# Patient Record
Sex: Male | Born: 1945 | Race: Black or African American | Hispanic: No | Marital: Married | Smoking: Never smoker
Health system: Southern US, Community
[De-identification: ages and names within clinical notes are randomized; demographics above are authoritative.]

## PROBLEM LIST (undated history)

## (undated) DIAGNOSIS — I4891 Unspecified atrial fibrillation: Secondary | ICD-10-CM

## (undated) DIAGNOSIS — R9431 Abnormal electrocardiogram [ECG] [EKG]: Secondary | ICD-10-CM

## (undated) DIAGNOSIS — B351 Tinea unguium: Secondary | ICD-10-CM

## (undated) DIAGNOSIS — I10 Essential (primary) hypertension: Secondary | ICD-10-CM

## (undated) DIAGNOSIS — I493 Ventricular premature depolarization: Secondary | ICD-10-CM

## (undated) DIAGNOSIS — J3089 Other allergic rhinitis: Secondary | ICD-10-CM

## (undated) DIAGNOSIS — R002 Palpitations: Secondary | ICD-10-CM

## (undated) DIAGNOSIS — R5383 Other fatigue: Secondary | ICD-10-CM

## (undated) DIAGNOSIS — N183 Chronic kidney disease, stage 3 unspecified: Secondary | ICD-10-CM

## (undated) DIAGNOSIS — D649 Anemia, unspecified: Secondary | ICD-10-CM

## (undated) DIAGNOSIS — C649 Malignant neoplasm of unspecified kidney, except renal pelvis: Secondary | ICD-10-CM

## (undated) DIAGNOSIS — J339 Nasal polyp, unspecified: Secondary | ICD-10-CM

## (undated) DIAGNOSIS — J4540 Moderate persistent asthma, uncomplicated: Secondary | ICD-10-CM

## (undated) DIAGNOSIS — M79606 Pain in leg, unspecified: Secondary | ICD-10-CM

## (undated) DIAGNOSIS — J320 Chronic maxillary sinusitis: Secondary | ICD-10-CM

## (undated) HISTORY — PX: SINOSCOPY: SHX187

## (undated) HISTORY — DX: Chronic maxillary sinusitis: J32.0

## (undated) HISTORY — DX: Other allergic rhinitis: J30.89

## (undated) HISTORY — DX: Moderate persistent asthma, uncomplicated: J45.40

## (undated) HISTORY — DX: Tinea unguium: B35.1

## (undated) HISTORY — DX: Nasal polyp, unspecified: J33.9

## (undated) HISTORY — DX: Palpitations: R00.2

## (undated) HISTORY — DX: Malignant neoplasm of unspecified kidney, except renal pelvis: C64.9

## (undated) HISTORY — DX: Essential (primary) hypertension: I10

## (undated) HISTORY — DX: Other fatigue: R53.83

## (undated) HISTORY — PX: HEMORRHOID SURGERY: SHX153

## (undated) HISTORY — DX: Pain in leg, unspecified: M79.606

## (undated) HISTORY — PX: OTHER SURGICAL HISTORY: SHX169

---

## 1998-10-05 HISTORY — PX: OTHER SURGICAL HISTORY: SHX169

## 2001-01-17 ENCOUNTER — Encounter: Admission: RE | Admit: 2001-01-17 | Discharge: 2001-01-17 | Payer: Self-pay | Admitting: *Deleted

## 2001-03-21 ENCOUNTER — Ambulatory Visit (HOSPITAL_COMMUNITY): Admission: RE | Admit: 2001-03-21 | Discharge: 2001-03-21 | Payer: Self-pay | Admitting: Gastroenterology

## 2001-03-23 ENCOUNTER — Emergency Department (HOSPITAL_COMMUNITY): Admission: EM | Admit: 2001-03-23 | Discharge: 2001-03-24 | Payer: Self-pay | Admitting: Emergency Medicine

## 2004-07-14 ENCOUNTER — Encounter: Admission: RE | Admit: 2004-07-14 | Discharge: 2004-07-14 | Payer: Self-pay | Admitting: Obstetrics and Gynecology

## 2006-08-12 ENCOUNTER — Ambulatory Visit (HOSPITAL_BASED_OUTPATIENT_CLINIC_OR_DEPARTMENT_OTHER): Admission: RE | Admit: 2006-08-12 | Discharge: 2006-08-12 | Payer: Self-pay | Admitting: Orthopedic Surgery

## 2009-07-18 ENCOUNTER — Ambulatory Visit: Payer: Self-pay | Admitting: Critical Care Medicine

## 2009-07-18 DIAGNOSIS — C649 Malignant neoplasm of unspecified kidney, except renal pelvis: Secondary | ICD-10-CM

## 2009-07-18 DIAGNOSIS — J4540 Moderate persistent asthma, uncomplicated: Secondary | ICD-10-CM

## 2009-07-18 DIAGNOSIS — I10 Essential (primary) hypertension: Secondary | ICD-10-CM

## 2009-07-18 HISTORY — DX: Moderate persistent asthma, uncomplicated: J45.40

## 2009-07-18 HISTORY — DX: Malignant neoplasm of unspecified kidney, except renal pelvis: C64.9

## 2009-07-18 HISTORY — DX: Essential (primary) hypertension: I10

## 2009-08-07 ENCOUNTER — Encounter: Payer: Self-pay | Admitting: Critical Care Medicine

## 2009-08-07 ENCOUNTER — Ambulatory Visit: Payer: Self-pay | Admitting: Critical Care Medicine

## 2009-08-22 ENCOUNTER — Ambulatory Visit: Payer: Self-pay | Admitting: Critical Care Medicine

## 2009-08-22 DIAGNOSIS — K219 Gastro-esophageal reflux disease without esophagitis: Secondary | ICD-10-CM | POA: Insufficient documentation

## 2009-11-07 ENCOUNTER — Ambulatory Visit: Payer: Self-pay | Admitting: Critical Care Medicine

## 2010-05-19 ENCOUNTER — Telehealth: Payer: Self-pay | Admitting: Critical Care Medicine

## 2010-09-26 ENCOUNTER — Encounter
Admission: RE | Admit: 2010-09-26 | Discharge: 2010-09-26 | Payer: Self-pay | Source: Home / Self Care | Attending: Gastroenterology | Admitting: Gastroenterology

## 2010-11-04 NOTE — Assessment & Plan Note (Signed)
Summary: Pulmonary OV   Copy to:  Dr. Harrington Challenger Primary Provider/Referring Provider:  Dr. Harrington Challenger  CC:  3 mo follow up.  states breathing is doing well overall.  No complaints. .  History of Present Illness: Pulmonary Consultation         This is a 65 year old male who presents with asthma.   Pt has been diagnosed with asthma for two months.  No real ppt factor.  Occ sneeze and runny eyes.  Rx IV and oral prednisone at ED and this helped.  Main issue is wheezing at night and dry cough.  Pt rx with ventolin as needed and this has not helped with wheeze.  Singulair has also been ineffective. No allergy testing was obtained.    Pt notes dry cough . Pt denies dyspnea, just wheeze and cough.  Never smoker. Pt denies any increase in rescue therapy over baseline, denies waking up needing it or having any early am or nocturnal exacerbations of coughing/wheezing/or dyspnea. Pt denies any significant sore throat, nasal congestion or excess secretions, fever, chills, sweats, unintended weight loss, pleurtic or exertional chest pain, orthopnea PND, or leg swelling   August 22, 2009 9:24 AM Cough is better,  wheeze is better. Pt denies any significant sore throat, nasal congestion or excess secretions, fever, chills, sweats, unintended weight loss, pleurtic or exertional chest pain, orthopnea PND, or leg swelling Pt denies any increase in rescue therapy over baseline, denies waking up needing it or having any early am or nocturnal exacerbations of coughing/wheezing/or dyspnea.  November 07, 2009 11:52 AM Pt doing well.  No recent issues with asthma. No cough or dyspnea.  No wheezing. Pt denies any significant sore throat, nasal congestion or excess secretions, fever, chills, sweats, unintended weight loss, pleurtic or exertional chest pain, orthopnea PND, or leg swelling Pt denies any increase in rescue therapy over baseline, denies waking up needing it or having any early am or nocturnal exacerbations of  coughing/wheezing/or dyspnea.    Asthma History    Asthma Control Assessment:    Age range: 12+ years    Symptoms: 0-2 days/week    Nighttime Awakenings: 0-2/month    Interferes w/ normal activity: no limitations    SABA use (not for EIB): 0-2 days/week    ATAQ questionnaire: 0    FEV1: 3.21 liters (today)    FEV1 Pred: 3.47 liters (today)    Exacerbations requiring oral systemic steroids: 0-1/year    Asthma Control Assessment: Well Controlled   Preventive Screening-Counseling & Management  Alcohol-Tobacco     Smoking Status: never  Current Medications (verified): 1)  Bystolic 10 Mg Tabs (Nebivolol Hcl) .... Once Daily 2)  Norvasc 5 Mg Tabs (Amlodipine Besylate) .... Once Daily 3)  Chlorthalidone 25 Mg Tabs (Chlorthalidone) .... Once Daily 4)  Omeprazole 20 Mg  Cpdr (Omeprazole) .... By Mouth Daily. Take One Half Hour Before Eating. 5)  Qvar 40 Mcg/act  Aers (Beclomethasone Dipropionate) .... Two Puffs Daily  Allergies (verified): No Known Drug Allergies  Past History:  Past medical, surgical, family and social histories (including risk factors) reviewed, and no changes noted (except as noted below).  Past Medical History: Reviewed history from 07/18/2009 and no changes required. KIDNEY CANCER (ICD-189.0) ASTHMA (ICD-493.90) HYPERTENSION (ICD-401.9)  Past Surgical History: Reviewed history from 07/18/2009 and no changes required. right hip replacement right ankle surgery right kidney removed    -2000 for renal CA Hemorrhoidectomy  Family History: Reviewed history from 07/18/2009 and no changes required. non contributory  Social History: Reviewed history from 07/18/2009 and no changes required. Patient never smoked.  alcohol 1-2 drinks/month widowed 3 children retired from [employer] attorney  Review of Systems  The patient denies shortness of breath with activity, shortness of breath at rest, productive cough, non-productive cough, coughing up blood,  chest pain, irregular heartbeats, acid heartburn, indigestion, loss of appetite, weight change, abdominal pain, difficulty swallowing, sore throat, tooth/dental problems, headaches, nasal congestion/difficulty breathing through nose, sneezing, itching, ear ache, anxiety, depression, hand/feet swelling, joint stiffness or pain, rash, change in color of mucus, and fever.    Vital Signs:  Patient profile:   65 year old male Height:      74 inches Weight:      238 pounds BMI:     30.67 O2 Sat:      100 % on Room air Temp:     98.0 degrees F oral Pulse rate:   60 / minute BP sitting:   122 / 84  (left arm) Cuff size:   regular  Vitals Entered By: Raymondo Band RN (November 07, 2009 11:48 AM)  O2 Flow:  Room air CC: 3 mo follow up.  states breathing is doing well overall.  No complaints.  Comments Medications reviewed with patient Daytime contact number verified with patient. Raymondo Band RN  November 07, 2009 11:49 AM    Physical Exam  Additional Exam:  Gen: Pleasant, well-nourished, in no distress,  normal affect ENT: No lesions,  mouth clear,  oropharynx clear, no postnasal drip Neck: No JVD, no TMG, no carotid bruits Lungs: No use of accessory muscles, no dullness to percussion, clear Cardiovascular: RRR, heart sounds normal, no murmur or gallops, no peripheral edema Abdomen: soft and NT, no HSM,  BS normal Musculoskeletal: No deformities, no cyanosis or clubbing Neuro: alert, non focal Skin: Warm, no lesions or rashes    Pre-Spirometry FEV1    Value: 3.21 L     Pred: 3.47 L     Impression & Recommendations:  Problem # 1:  ASTHMA (ICD-493.90) Assessment Improved  Prominent pseudowheeze and GERD symptoms consistent with upper airway instability  but also lower airway inflammation consistent wiht Dx of asthma>> all improved with rx of GERD and ICS   plan try to wean off qvar cont to follow reflux diet closely and possibly wean PPI off as well as needed SABA rov 75months    Complete Medication List: 1)  Bystolic 10 Mg Tabs (Nebivolol hcl) .... Once daily 2)  Norvasc 5 Mg Tabs (Amlodipine besylate) .... Once daily 3)  Chlorthalidone 25 Mg Tabs (Chlorthalidone) .... Once daily 4)  Omeprazole 20 Mg Cpdr (Omeprazole) .... By mouth daily. take one half hour before eating. 5)  Qvar 40 Mcg/act Aers (Beclomethasone dipropionate) .... Two puffs daily  Other Orders: Est. Patient Level III [id number]  Patient Instructions: 1)  You can wean off Qvar,  restart if you start wheezing or coughing again and call and let us know you restarted 2)  Follow reflux diet closely and you can then wean off omeprazole 3)  Return 4 months  Appended Document: Pulmonary OV fax Gus Height

## 2010-11-04 NOTE — Progress Notes (Signed)
Summary: rx  Phone Note Call from Patient Call back at [phone]   Caller: Patient Call For: wright Reason for Call: Talk to Nurse Summary of Call: pt on Qvar, cost is $100+.  Pt wants to know if there is something else you could prescribe thjat is cheaper? [facility] [address] Initial call taken by: Zigmund Gottron,  May 19, 2010 9:06 AM  Follow-up for Phone Call        advisd pt to contact his insurance company and ask for a drug formulary or ask them for covered medication in place of qvar. Advised that he is overdue for an appt, so when he gets this info to call and schedule appt to discuss with PW. Pt states he will do so. [place] Bing CMA  May 19, 2010 9:42 AM

## 2011-02-20 NOTE — Procedures (Signed)
[facility]  Patient:    Paul Roach, Paul Roach                        MRN: [id number] Adm. Date:  [id number] Attending:  Rafael Bihari CC:         Charles A. Harrington Challenger, M.D.   Procedure Report  PROCEDURE:  Colonoscopy.  ENDOSCOPIST:  Elyse Jarvis. Amedeo Plenty, M.D.  INDICATION FOR PROCEDURE:  Family history of colon cancer in two half siblings.  DESCRIPTION OF PROCEDURE:  The patient was placed in the left lateral decubitus position and placed on the pulse monitor with continuous low-flow oxygen delivered by nasal cannula.  He was sedated with Demerol 60 mg IV and Versed 6 mg IV.  The Olympus video colonoscope was inserted into the rectum and advanced to the cecum, confirmed by transillumination of McBurneys point and visualization of the ileocecal valve and appendiceal orifice.  The prep was good.  The cecum and ascending colon appeared normal with no masses, polyps, diverticula or other mucosal abnormalities.  Within the transverse colon, there were seen a typical-appearing lipoma which was not biopsied; this was approximately 1.5 cm in diameter.  The remainder of the transverse, descending, sigmoid and rectum appeared normal down to the anus, where there were some small non-thrombosed internal hemorrhoids seen.  The colonoscope was then withdrawn and the patient returned to the recovery room in stable condition.  He tolerated the procedure well and there were no immediate complications.  IMPRESSION:  Transverse colon lipoma, otherwise, normal colonoscopy with small internal hemorrhoids. DD:  03/21/01 TD:  03/21/01 Job: [id number]

## 2011-02-20 NOTE — Op Note (Signed)
NAME:  Paul Roach, Paul Roach NO.:  [id number]   MEDICAL RECORD NO.:  [id number]          PATIENT TYPE:  AMB   LOCATION:  [place]                          FACILITY:  [facility]   PHYSICIAN:  Ninetta Lights, M.D. DATE OF BIRTH:  1946-02-19   DATE OF PROCEDURE:  DATE OF DISCHARGE:                                 OPERATIVE REPORT   PREOPERATIVE DIAGNOSES:  1. Displaced lateral malleolus fracture, right ankle.  2. Mild deltoid ligament sprain.   POSTOPERATIVE DIAGNOSES:  1. Displaced lateral malleolus fracture, right ankle.  2. Mild deltoid ligament sprain.   OPERATIVE PROCEDURE:  Open reduction internal fixation of lateral malleolus  fracture with a 6-hole one-third tubular Synthes plate and screws.   SURGEON:  Ninetta Lights, M.D.   ASSISTANT:  Alyson Locket. Velora Heckler.   ANESTHESIA:  General.   BLOOD LOSS:  Minimal.   TOURNIQUET TIME:  40 minutes.   SPECIMENS:  None.   CULTURES:  None.   COMPLICATIONS:  None.   DRESSINGS:  Soft compressive with CAM walker.   DESCRIPTION OF THE PROCEDURE:  The patient was brought to the operating  room, and after adequate anesthesia had been obtained, tourniquet applied to  the upper aspect of the right leg.  Prepped and draped in the usual sterile  fashion.  Stress views were obtained with fluoroscopy.  With external  rotation, the lateral malleolus fracture was obviously displaced but the  syndesmosis does not open as the fracture begins below this and then exits  out superior and lateral.  Opens very slightly on the medial side showing a  deltoid ligament sprain, but this is not traumatic.  Exsanguinated with  elevation of Esmarch.  Tourniquet deflated to 350 mmHg.  A longitudinal  incision over the fibula.  Skin and subcutaneous divided.  Subperiosteal  exposure of the fracture.  The fibrous tissue from chronicity of injury  until the time of surgery was taken down, inspected, reapproximate the  fracture anatomically.  When  that was completed, I could reduce the fracture  anatomically.  A 6-hole one-third tubular plate was pre-bent to be placed on  the posterolateral aspect of the fibula.  Care was taken not to penetrate  the syndesmosis or the joint with any of the screws.  Confirmed good  reduction and fixation.  Held in place with a clamp.  Plate solidly fixed  with five 3.5 mm cortical screws and 1 distal 4.0 cancellous screw.  Excellent alignment, capture and fixation confirmed visually as well as  fluoroscopically.  Wound irrigated.  Closed with Vicryl and then staples.  A  sterile compressive dressing applied.  CAM walker applied.  Tourniquet  inflator removed.  Anesthesia reversed.  Brought to the recovery room.  Tolerated the surgery well.  No complications.      Ninetta Lights, M.D.  Electronically Signed     DFM/MEDQ  D:  08/12/2006  T:  08/13/2006  Job:  [id number]

## 2011-12-17 ENCOUNTER — Encounter: Payer: Self-pay | Admitting: *Deleted

## 2011-12-17 ENCOUNTER — Ambulatory Visit (INDEPENDENT_AMBULATORY_CARE_PROVIDER_SITE_OTHER): Payer: Medicare Other | Admitting: Critical Care Medicine

## 2011-12-17 ENCOUNTER — Ambulatory Visit (HOSPITAL_BASED_OUTPATIENT_CLINIC_OR_DEPARTMENT_OTHER)
Admission: RE | Admit: 2011-12-17 | Discharge: 2011-12-17 | Disposition: A | Discharge status: Home / Self Care | Payer: Medicare Other | Source: Ambulatory Visit | Attending: Critical Care Medicine | Admitting: Critical Care Medicine

## 2011-12-17 VITALS — BP 140/90 | HR 61 | Temp 98.0°F | Ht 74.0 in | Wt 246.0 lb

## 2011-12-17 DIAGNOSIS — K219 Gastro-esophageal reflux disease without esophagitis: Secondary | ICD-10-CM

## 2011-12-17 DIAGNOSIS — R059 Cough, unspecified: Secondary | ICD-10-CM | POA: Insufficient documentation

## 2011-12-17 DIAGNOSIS — J45909 Unspecified asthma, uncomplicated: Secondary | ICD-10-CM

## 2011-12-17 DIAGNOSIS — R05 Cough: Secondary | ICD-10-CM | POA: Insufficient documentation

## 2011-12-17 MED ORDER — PREDNISONE 10 MG PO TABS: ORAL_TABLET | ORAL | Status: DC

## 2011-12-17 MED ORDER — OMEPRAZOLE 20 MG PO CPDR: 20.0000 mg | DELAYED_RELEASE_CAPSULE | Freq: Every day | ORAL | Status: DC

## 2011-12-17 MED ORDER — BECLOMETHASONE DIPROPIONATE 40 MCG/ACT IN AERS: 2.0000 | INHALATION_SPRAY | Freq: Two times a day (BID) | RESPIRATORY_TRACT | Status: DC

## 2011-12-17 NOTE — Patient Instructions (Addendum)
Prednisone 10mg  Take 4 for two days three for two days two for two days one for two days Omeprazole 20mg  daily for 30days Reflux diet Chest Xray today Start Qvar two puff twice daily Return 6 weeks

## 2011-12-17 NOTE — Assessment & Plan Note (Addendum)
Or persistent asthma with acute flare Plan Prednisone 10mg  Take 4 for two days three for two days two for two days one for two days Omeprazole 20mg  daily for 30days Reflux diet Chest Xray today Start Qvar two puff twice daily Return 6 weeks

## 2011-12-17 NOTE — Progress Notes (Signed)
Subjective:    Patient ID: Paul Roach, male    DOB: December 21, 1945, 65 y.o.   MRN: [id number]  HPI This is a 66 year old male who presents with asthma.  Pt has been diagnosed with asthma for two months. No real ppt factor. Occ sneeze and runny eyes. Rx IV and oral prednisone at ED and this helped. Main issue is wheezing at night and dry cough. Pt rx with ventolin as needed and this has not helped with wheeze. Singulair has also been ineffective.  No allergy testing was obtained.  Pt notes dry cough . Pt denies dyspnea, just wheeze and cough. Never smoker.  Pt denies any increase in rescue therapy over baseline, denies waking up needing it or having any early am or nocturnal exacerbations of coughing/wheezing/or dyspnea.  Pt denies any significant sore throat, nasal congestion or excess secretions, fever, chills, sweats, unintended weight loss, pleurtic or exertional chest pain, orthopnea PND, or leg swelling  August 22, 2009 9:24 AM  Cough is better, wheeze is better.  Pt denies any significant sore throat, nasal congestion or excess secretions, fever, chills, sweats, unintended weight loss, pleurtic or exertional chest pain, orthopnea PND, or leg swelling  Pt denies any increase in rescue therapy over baseline, denies waking up needing it or having any early am or nocturnal exacerbations of coughing/wheezing/or dyspnea.  November 07, 2009 11:52 AM  Pt doing well. No recent issues with asthma. No cough or dyspnea. No wheezing.  Pt denies any significant sore throat, nasal congestion or excess secretions, fever, chills, sweats, unintended weight loss, pleurtic or exertional chest pain, orthopnea PND, or leg swelling  Pt denies any increase in rescue therapy over baseline, denies waking up needing it or having any early am or nocturnal exacerbations of coughing/wheezing/or dyspnea.   12/17/2011 Not seen since 2011.  Had weaned off ICS since that time. Now: Pt noting worse cold in January time  frame.  Pt notes more wheezing.  ? Dietary. Now symptoms worse at night. Notes some cough at night. Has a tickle in the throat area.  Pt denies any pndrip. Not that dyspneic.  No heartburn .  No dysphagia or sore throat.    Review of Systems 11pt ros neg except per HPI    Objective:   Physical Exam Filed Vitals:   12/17/11 1525  BP: 140/90  Pulse: 61  Temp: 98 F (36.7 C)  TempSrc: Oral  Height: 6\' 2"  (1.88 m)  Weight: 246 lb (111.585 kg)  SpO2: 98%    Gen: Pleasant, well-nourished, in no distress,  normal affect  ENT: No lesions,  mouth clear,  oropharynx clear, no postnasal drip  Neck: No JVD, no TMG, no carotid bruits  Lungs: No use of accessory muscles, no dullness to percussion, distant BS, exp wheeze  Cardiovascular: RRR, heart sounds normal, no murmur or gallops, no peripheral edema  Abdomen: soft and NT, no HSM,  BS normal  Musculoskeletal: No deformities, no cyanosis or clubbing  Neuro: alert, non focal  Skin: Warm, no lesions or rashes  Dg Chest 2 View  12/17/2011  *RADIOLOGY REPORT*  Clinical Data: Cough.  Acid reflux  CHEST - 2 VIEW  Comparison: None  Findings: The heart size and mediastinal contours are within normal limits.  Both lungs are clear.  The visualized skeletal structures are unremarkable.  IMPRESSION: Negative exam.  Original Report Authenticated By: Angelita Ingles, M.D.          Assessment & Plan:   ASTHMA Or  persistent asthma with acute flare Plan Prednisone 10mg  Take 4 for two days three for two days two for two days one for two days Omeprazole 20mg  daily for 30days Reflux diet Chest Xray today Start Qvar two puff twice daily Return 6 weeks    Updated Medication List Outpatient Encounter Prescriptions as of 12/17/2011  Medication Sig Dispense Refill  . albuterol (PROVENTIL HFA;VENTOLIN HFA) 108 (90 BASE) MCG/ACT inhaler Inhale 2 puffs into the lungs as needed.      Marland Kitchen amLODipine (NORVASC) 5 MG tablet Take 5 mg by mouth  daily.      . chlorthalidone (HYGROTON) 25 MG tablet Take 25 mg by mouth daily.      . fluticasone (FLONASE) 50 MCG/ACT nasal spray Place 2 sprays into the nose daily.      . nebivolol (BYSTOLIC) 10 MG tablet Take 10 mg by mouth daily.      . beclomethasone (QVAR) 40 MCG/ACT inhaler Inhale 2 puffs into the lungs 2 (two) times daily.  1 Inhaler  12  . omeprazole (PRILOSEC) 20 MG capsule Take 1 capsule (20 mg total) by mouth daily.  30 capsule  0  . predniSONE (DELTASONE) 10 MG tablet Take 4 for two days three for two days two for two days one for two days  20 tablet  0

## 2011-12-18 NOTE — Progress Notes (Signed)
Quick Note:  Notify the patient that the Xray is stable and no pneumonia No change in medications are recommended. Continue current meds as prescribed at last office visit ______ 

## 2011-12-18 NOTE — Progress Notes (Signed)
Quick Note:  Called, spoke with pt. I informed him of xray results and recs per PW. He verbalized understanding of this and voiced no further questions/concerns at this time. ______

## 2012-01-25 ENCOUNTER — Ambulatory Visit: Payer: Medicare Other | Admitting: Critical Care Medicine

## 2012-02-11 ENCOUNTER — Ambulatory Visit: Payer: Medicare Other | Admitting: Critical Care Medicine

## 2012-03-03 ENCOUNTER — Ambulatory Visit (INDEPENDENT_AMBULATORY_CARE_PROVIDER_SITE_OTHER): Payer: Medicare Other | Admitting: Critical Care Medicine

## 2012-03-03 ENCOUNTER — Encounter: Payer: Self-pay | Admitting: Critical Care Medicine

## 2012-03-03 VITALS — BP 138/88 | HR 54 | Temp 98.0°F | Ht 74.0 in | Wt 231.0 lb

## 2012-03-03 DIAGNOSIS — J45909 Unspecified asthma, uncomplicated: Secondary | ICD-10-CM

## 2012-03-03 NOTE — Progress Notes (Signed)
Subjective:    Patient ID: Paul Roach, male    DOB: 03/16/1946, 66 y.o.   MRN: [id number]  HPI  03/03/2012 Prednisone 10mg  Take 4 for two days three for two days two for two days one for two days Omeprazole 20mg  daily for 30days Reflux diet Chest Xray today Start Qvar two puff twice daily Since 3/13: no cough or wheeze. Occ clear throat.  Dyspnea is better. Trying to follow the diet. On the qvar twice important.   Pt denies any significant sore throat, nasal congestion or excess secretions, fever, chills, sweats, unintended weight loss, pleurtic or exertional chest pain, orthopnea PND, or leg swelling Pt denies any increase in rescue therapy over baseline, denies waking up needing it or having any early am or nocturnal exacerbations of coughing/wheezing/or dyspnea. Pt also denies any obvious fluctuation in symptoms with  weather or environmental change or other alleviating or aggravating factors   Past Medical History  Diagnosis Date  . Cancer of kidney   . Asthma   . Hypertension      No family history on file.   History   Social History  . Marital Status: Widowed    Spouse Name: N/A    Number of Children: 3  . Years of Education: N/A   Occupational History  . Retired     Patent attorney   Social History Main Topics  . Smoking status: Never Smoker   . Smokeless tobacco: Not on file  . Alcohol Use: Yes     1-2 drinks/month  . Drug Use: Not on file  . Sexually Active: Not on file   Other Topics Concern  . Not on file   Social History Narrative  . No narrative on file     No Known Allergies   Outpatient Prescriptions Prior to Visit  Medication Sig Dispense Refill  . albuterol (PROVENTIL HFA;VENTOLIN HFA) 108 (90 BASE) MCG/ACT inhaler Inhale 2 puffs into the lungs as needed.      Marland Kitchen amLODipine (NORVASC) 5 MG tablet Take 5 mg by mouth daily.      . beclomethasone (QVAR) 40 MCG/ACT inhaler Inhale 2 puffs into the lungs 2 (two) times daily.  1 Inhaler  12    . chlorthalidone (HYGROTON) 25 MG tablet Take 25 mg by mouth daily.      . nebivolol (BYSTOLIC) 10 MG tablet Take 10 mg by mouth daily.      . fluticasone (FLONASE) 50 MCG/ACT nasal spray Place 2 sprays into the nose daily.      Marland Kitchen omeprazole (PRILOSEC) 20 MG capsule Take 1 capsule (20 mg total) by mouth daily.  30 capsule  0  . predniSONE (DELTASONE) 10 MG tablet Take 4 for two days three for two days two for two days one for two days  20 tablet  0       Review of Systems 11 point review of systems taken in detail and showed no positives except in the history of present illness    Objective:   Physical Exam Filed Vitals:   03/03/12 0912  BP: 138/88  Pulse: 54  Temp: 98 F (36.7 C)  TempSrc: Oral  Height: 6\' 2"  (1.88 m)  Weight: 231 lb (104.781 kg)  SpO2: 100%    Gen: Pleasant, well-nourished, in no distress,  normal affect  ENT: No lesions,  mouth clear,  oropharynx clear, no postnasal drip  Neck: No JVD, no TMG, no carotid bruits  Lungs: No use of accessory muscles, no dullness to percussion,  clear without rales or rhonchi  Cardiovascular: RRR, heart sounds normal, no murmur or gallops, no peripheral edema  Abdomen: soft and NT, no HSM,  BS normal  Musculoskeletal: No deformities, no cyanosis or clubbing  Neuro: alert, non focal  Skin: Warm, no lesions or rashes  No results found.        Assessment & Plan:   ASTHMA Moderate persistent asthma with reflux disease as a precipitating factor now improved with control of cough Plan Continue reflux diet Maintain inhaled steroid Return 1 year    Updated Medication List Outpatient Encounter Prescriptions as of 03/03/2012  Medication Sig Dispense Refill  . albuterol (PROVENTIL HFA;VENTOLIN HFA) 108 (90 BASE) MCG/ACT inhaler Inhale 2 puffs into the lungs as needed.      Marland Kitchen amLODipine (NORVASC) 5 MG tablet Take 5 mg by mouth daily.      . beclomethasone (QVAR) 40 MCG/ACT inhaler Inhale 2 puffs into the lungs 2  (two) times daily.  1 Inhaler  12  . chlorthalidone (HYGROTON) 25 MG tablet Take 25 mg by mouth daily.      . nebivolol (BYSTOLIC) 10 MG tablet Take 10 mg by mouth daily.      Marland Kitchen DISCONTD: fluticasone (FLONASE) 50 MCG/ACT nasal spray Place 2 sprays into the nose daily.      Marland Kitchen DISCONTD: omeprazole (PRILOSEC) 20 MG capsule Take 1 capsule (20 mg total) by mouth daily.  30 capsule  0  . DISCONTD: predniSONE (DELTASONE) 10 MG tablet Take 4 for two days three for two days two for two days one for two days  20 tablet  0

## 2012-03-03 NOTE — Assessment & Plan Note (Signed)
Moderate persistent asthma with reflux disease as a precipitating factor now improved with control of cough Plan Continue reflux diet Maintain inhaled steroid Return 1 year

## 2012-03-03 NOTE — Patient Instructions (Signed)
STay on Qvar Follow reflux diet>>>moderation is the key Return 1 year or as needed

## 2012-06-15 ENCOUNTER — Encounter: Payer: Self-pay | Admitting: Critical Care Medicine

## 2012-06-15 ENCOUNTER — Ambulatory Visit (INDEPENDENT_AMBULATORY_CARE_PROVIDER_SITE_OTHER): Payer: Medicare Other | Admitting: Critical Care Medicine

## 2012-06-15 VITALS — BP 132/88 | HR 53 | Temp 98.4°F | Ht 74.0 in | Wt 242.2 lb

## 2012-06-15 DIAGNOSIS — J45909 Unspecified asthma, uncomplicated: Secondary | ICD-10-CM

## 2012-06-15 DIAGNOSIS — Z23 Encounter for immunization: Secondary | ICD-10-CM

## 2012-06-15 MED ORDER — BUDESONIDE-FORMOTEROL FUMARATE 160-4.5 MCG/ACT IN AERO: 2.0000 | INHALATION_SPRAY | Freq: Two times a day (BID) | RESPIRATORY_TRACT | Status: DC

## 2012-06-15 MED ORDER — PREDNISONE 10 MG PO TABS: ORAL_TABLET | ORAL | Status: DC

## 2012-06-15 NOTE — Progress Notes (Signed)
Subjective:    Patient ID: Paul Roach, male    DOB: 01-Oct-1946, 66 y.o.   MRN: [id number]  HPI    06/15/2012 Pt starting back with a cough and wheezing.  Cough is occ productive  Clear.   Pt using Qvar  Daily.  Using Ventolin about twice a day two puff. No heartburn.  Pt notes some pndrip.  Pt will choke. Niox 30 (HI)  Past Medical History  Diagnosis Date  . Cancer of kidney   . Asthma   . Hypertension      No family history on file.   History   Social History  . Marital Status: Widowed    Spouse Name: N/A    Number of Children: 3  . Years of Education: N/A   Occupational History  . Retired     Patent attorney   Social History Main Topics  . Smoking status: Never Smoker   . Smokeless tobacco: Never Used  . Alcohol Use: Yes     1-2 drinks/month  . Drug Use: Not on file  . Sexually Active: Not on file   Other Topics Concern  . Not on file   Social History Narrative  . No narrative on file     No Known Allergies   Outpatient Prescriptions Prior to Visit  Medication Sig Dispense Refill  . albuterol (PROVENTIL HFA;VENTOLIN HFA) 108 (90 BASE) MCG/ACT inhaler Inhale 2 puffs into the lungs as needed.      Marland Kitchen amLODipine (NORVASC) 5 MG tablet Take 5 mg by mouth daily.      . chlorthalidone (HYGROTON) 25 MG tablet Take 25 mg by mouth daily.      . nebivolol (BYSTOLIC) 10 MG tablet Take 10 mg by mouth daily.      . beclomethasone (QVAR) 40 MCG/ACT inhaler Inhale 2 puffs into the lungs 2 (two) times daily.  1 Inhaler  12       Review of Systems  11 point review of systems taken in detail and showed no positives except in the history of present illness    Objective:   Physical Exam  Filed Vitals:   06/15/12 1114  BP: 132/88  Pulse: 53  Temp: 98.4 F (36.9 C)  TempSrc: Oral  Height: 6\' 2"  (1.88 m)  Weight: 109.861 kg (242 lb 3.2 oz)  SpO2: 96%    Gen: Pleasant, well-nourished, in no distress,  normal affect  ENT: No lesions,  mouth clear,   oropharynx clear, no postnasal drip  Neck: No JVD, no TMG, no carotid bruits  Lungs: No use of accessory muscles, no dullness to percussion, expired wheezes  Cardiovascular: RRR, heart sounds normal, no murmur or gallops, no peripheral edema  Abdomen: soft and NT, no HSM,  BS normal  Musculoskeletal: No deformities, no cyanosis or clubbing  Neuro: alert, non focal  Skin: Warm, no lesions or rashes  No results found.  NIOX 77 06/15/2012       Assessment & Plan:   ASTHMA Moderate persistent asthma now with increased flair do to increased lower airway inflammation Note current NIOX (NO)  measurement elevated at  77 Plan  Prednisone 10mg  Take 4 for two days three for two days two for two days one for two days Stop Qvar Start Symbicort two puff twice daily Flu vaccine today 06/15/2012 Return 2 months       Updated Medication List Outpatient Encounter Prescriptions as of 06/15/2012  Medication Sig Dispense Refill  . albuterol (PROVENTIL HFA;VENTOLIN HFA) 108 (90 BASE)  MCG/ACT inhaler Inhale 2 puffs into the lungs as needed.      Marland Kitchen amLODipine (NORVASC) 5 MG tablet Take 5 mg by mouth daily.      . chlorthalidone (HYGROTON) 25 MG tablet Take 25 mg by mouth daily.      . Multiple Vitamin (MULTIVITAMIN) tablet Take 1 tablet by mouth daily.      . nebivolol (BYSTOLIC) 10 MG tablet Take 10 mg by mouth daily.      Marland Kitchen DISCONTD: beclomethasone (QVAR) 40 MCG/ACT inhaler Inhale 2 puffs into the lungs 2 (two) times daily.  1 Inhaler  12  . budesonide-formoterol (SYMBICORT) 160-4.5 MCG/ACT inhaler Inhale 2 puffs into the lungs 2 (two) times daily.  1 Inhaler  12  . predniSONE (DELTASONE) 10 MG tablet Take 4 for two days three for two days two for two days one for two days  20 tablet  0

## 2012-06-15 NOTE — Patient Instructions (Addendum)
Prednisone 10mg  Take 4 for two days three for two days two for two days one for two days Stop Qvar Start Symbicort two puff twice daily Flu vaccine today 06/15/2012 Return 2 months

## 2012-06-15 NOTE — Assessment & Plan Note (Signed)
Moderate persistent asthma now with increased flair do to increased lower airway inflammation Note current NIOX (NO)  measurement elevated at  77 Plan  Prednisone 10mg  Take 4 for two days three for two days two for two days one for two days Stop Qvar Start Symbicort two puff twice daily Flu vaccine today 06/15/2012 Return 2 months

## 2012-06-27 ENCOUNTER — Encounter: Payer: Self-pay | Admitting: Critical Care Medicine

## 2012-08-18 ENCOUNTER — Ambulatory Visit: Payer: Medicare Other | Admitting: Critical Care Medicine

## 2012-09-23 ENCOUNTER — Telehealth: Payer: Self-pay | Admitting: Critical Care Medicine

## 2012-09-23 NOTE — Telephone Encounter (Signed)
Pt called back and he is aware of the process to change doctors in the same practice.  Will forward message to CY and PW to see if ok for the pt to change doctors.  Pt did not give a reason of why he wanted to change.   Please advise. thanks

## 2012-09-23 NOTE — Telephone Encounter (Signed)
Not sure what the issue is but ok with me

## 2012-09-23 NOTE — Telephone Encounter (Signed)
lmomtcb x1 for pt 

## 2012-09-26 NOTE — Telephone Encounter (Signed)
Per CY-okay to switch. Please put on next open consult slot.

## 2012-09-26 NOTE — Telephone Encounter (Signed)
please advise Dr. Annamaria Boots thanks

## 2012-09-26 NOTE — Telephone Encounter (Signed)
Spoke with pt and scheduled consult with [facility] for 11/01/11  Pt states nothing further needed

## 2012-10-31 ENCOUNTER — Encounter: Payer: Self-pay | Admitting: Internal Medicine

## 2012-10-31 ENCOUNTER — Other Ambulatory Visit (INDEPENDENT_AMBULATORY_CARE_PROVIDER_SITE_OTHER): Payer: Medicare Other

## 2012-10-31 ENCOUNTER — Ambulatory Visit (INDEPENDENT_AMBULATORY_CARE_PROVIDER_SITE_OTHER): Payer: Medicare Other | Admitting: Internal Medicine

## 2012-10-31 VITALS — BP 142/90 | HR 56 | Ht 74.0 in | Wt 242.6 lb

## 2012-10-31 DIAGNOSIS — J310 Chronic rhinitis: Secondary | ICD-10-CM

## 2012-10-31 DIAGNOSIS — J320 Chronic maxillary sinusitis: Secondary | ICD-10-CM

## 2012-10-31 LAB — CBC WITH DIFFERENTIAL/PLATELET
Basophils Relative: 0.4 % (ref 0.0–3.0)
Eosinophils Relative: 9.6 % — ABNORMAL HIGH (ref 0.0–5.0)
Lymphocytes Relative: 29.7 % (ref 12.0–46.0)
MCV: 93.9 fl (ref 78.0–100.0)
Monocytes Relative: 10.3 % (ref 3.0–12.0)
Neutrophils Relative %: 50 % (ref 43.0–77.0)
Platelets: 148 10*3/uL — ABNORMAL LOW (ref 150.0–400.0)
RBC: 4.47 Mil/uL (ref 4.22–5.81)
WBC: 5.9 10*3/uL (ref 4.5–10.5)

## 2012-10-31 MED ORDER — AMOXICILLIN-POT CLAVULANATE 875-125 MG PO TABS: 1.0000 | ORAL_TABLET | Freq: Two times a day (BID) | ORAL | Status: DC

## 2012-10-31 NOTE — Patient Instructions (Addendum)
Script for antibiotic augmentin sent  Sample Dymista nasal spray     1-2 puffs each nostril once every day at bedtime  Ok to use the saline nasal rinse  Order- lab- CBC, Allergy profile    Dx rhinitis  To

## 2012-10-31 NOTE — Progress Notes (Signed)
10/31/12- 66 yoM never smoker previously followed by Dr Joya Gaskins for moderate persistent asthma Former patient of [facility]; ? breathing issues vs allergies causing so many "colds"-mainly happens in fall season. Over the past 3 years she says she has had "too many colds". She was evaluated by Dr. Joya Gaskins for wheeze which resolved. Especially this year she feels she is getting too many head colds, reporting with 5 separate illnesses in the last 5 months. She occasionally spends time with school-age grandchildren we might bring colds. These episodes usually start with sneezing, especially in the fall. Her current exacerbation began one day before this admission. She's had no fever. Sputum is clear but sometimes gets a little darker. No ear discomfort and no headache. Not needing inhalers. No ENT evaluation. Not much history of seasonal allergic rhinitis. CXR 12/18/11 IMPRESSION:  Negative exam.  Original Report Authenticated By: Angelita Ingles, M.D.  Prior to Admission medications   Medication Sig Start Date End Date Taking? Authorizing Provider  albuterol (PROVENTIL HFA;VENTOLIN HFA) 108 (90 BASE) MCG/ACT inhaler Inhale 2 puffs into the lungs as needed.   Yes Historical Provider, MD  amLODipine (NORVASC) 5 MG tablet Take 5 mg by mouth daily.   Yes Historical Provider, MD  budesonide-formoterol (SYMBICORT) 160-4.5 MCG/ACT inhaler Inhale 2 puffs into the lungs 2 (two) times daily. 06/15/12 06/15/13 Yes Elsie Stain, MD  chlorthalidone (HYGROTON) 25 MG tablet Take 25 mg by mouth daily.   Yes Historical Provider, MD  Multiple Vitamin (MULTIVITAMIN) tablet Take 1 tablet by mouth daily.   Yes Historical Provider, MD  nebivolol (BYSTOLIC) 10 MG tablet Take 10 mg by mouth daily.   Yes Historical Provider, MD  amoxicillin-clavulanate (AUGMENTIN) 875-125 MG per tablet Take 1 tablet by mouth 2 (two) times daily. 10/31/12 10/31/13  Deneise Lever, MD  Azelastine-Fluticasone (DYMISTA) 137-50 MCG/ACT SUSP Place 1-2 sprays  into the nose at bedtime. 11/01/12   Deneise Lever, MD   Past Medical History  Diagnosis Date  . Cancer of kidney   . Asthma   . Hypertension   . History of kidney cancer    Past Surgical History  Procedure Date  . Right hip replacement   . Right ankle surgery   . Right kidney removed 2000    for renal ca  . Hemorrhoid surgery    ROS-see HPI Constitutional:   No-   weight loss, night sweats, fevers, chills, fatigue, lassitude. HEENT:   No-  headaches, difficulty swallowing, tooth/dental problems, sore throat,       No-  sneezing, itching, ear ache, nasal congestion, post nasal drip,  CV:  No-   chest pain, orthopnea, PND, swelling in lower extremities, anasarca,                                  dizziness, palpitations Resp: No-   shortness of breath with exertion or at rest.              No-   productive cough,  No non-productive cough,  No- coughing up of blood.              No-   change in color of mucus.  No- wheezing.   Skin: No-   rash or lesions. GI:  No-   heartburn, indigestion, abdominal pain, nausea, vomiting, diarrhea,                 change in bowel habits,  loss of appetite GU: No-   dysuria, change in color of urine, no urgency or frequency.  No- flank pain. MS:  No-   joint pain or swelling.  No- decreased range of motion.  No- back pain. Neuro-     nothing unusual Psych:  No- change in mood or affect. No depression or anxiety.  No memory loss.  OBJ- Physical Exam General- Alert, Oriented, Affect-appropriate, Distress- none acute. Looks fit. Skin- rash-none, lesions- none, excoriation- none Lymphadenopathy- none Head- atraumatic            Eyes- Gross vision intact, PERRLA, conjunctivae and secretions clear            Ears- Hearing, canals-normal            Nose- + sniffs watery, no-Septal dev, mucus, polyps, erosion, perforation + turbinate edema            Throat- Mallampati II-III , mucosa clear , drainage- none, tonsils- atrophic Neck- flexible , trachea  midline, no stridor , thyroid nl, carotid no bruit Chest - symmetrical excursion , unlabored           Heart/CV- RRR , no murmur , no gallop  , no rub, nl s1 s2                           - JVD- none , edema- none, stasis changes- none, varices- none           Lung- clear to P&A, wheeze- none, cough- none , dullness-none, rub- none           Chest wall-  Abd- tender-no, distended-no, bowel sounds-present, HSM- no Br/ Gen/ Rectal- Not done, not indicated Extrem- cyanosis- none, clubbing, none, atrophy- none, strength- nl Neuro- grossly intact to observation

## 2012-11-01 LAB — ALLERGY FULL PROFILE
Allergen, D pternoyssinus,d7: <0.1 kU/L
Allergen,Goose feathers, e70: <0.1 kU/L
Alternaria Alternata: <0.1 kU/L
Aspergillus fumigatus, m3: <0.1 kU/L
Bermuda Grass: <0.1 kU/L
Candida Albicans: <0.1 kU/L
Cat Dander: <0.1 kU/L
Common Ragweed: <0.1 kU/L
D. farinae: <0.1 kU/L
Elm IgE: <0.1 kU/L
House Dust Hollister: <0.1 kU/L
IgE (Immunoglobulin E), Serum: 74 IU/mL (ref 0.0–180.0)
Lamb's Quarters: <0.1 kU/L
Oak: <0.1 kU/L
Plantain: <0.1 kU/L
Stemphylium Botryosum: <0.1 kU/L
Timothy Grass: <0.1 kU/L

## 2012-11-01 MED ORDER — AZELASTINE-FLUTICASONE 137-50 MCG/ACT NA SUSP: 1.0000 | Freq: Every day | NASAL | Status: DC

## 2012-11-03 NOTE — Progress Notes (Signed)
Quick Note:  Pt aware of results. ______ 

## 2012-11-09 DIAGNOSIS — J320 Chronic maxillary sinusitis: Secondary | ICD-10-CM | POA: Insufficient documentation

## 2012-11-09 HISTORY — DX: Chronic maxillary sinusitis: J32.0

## 2012-11-09 NOTE — Assessment & Plan Note (Signed)
I can't tell if he has not resolved a sinus infection this winter or he is actually getting recurrent infections. Plan-lab for allergy profile and CBC, trial of Augmentin, sample Dymista nasal spray

## 2012-12-12 ENCOUNTER — Other Ambulatory Visit: Payer: Medicare Other

## 2012-12-12 ENCOUNTER — Ambulatory Visit (INDEPENDENT_AMBULATORY_CARE_PROVIDER_SITE_OTHER): Payer: Medicare Other | Admitting: Internal Medicine

## 2012-12-12 ENCOUNTER — Encounter: Payer: Self-pay | Admitting: Internal Medicine

## 2012-12-12 VITALS — BP 132/84 | HR 62 | Ht 74.0 in | Wt 241.8 lb

## 2012-12-12 DIAGNOSIS — J40 Bronchitis, not specified as acute or chronic: Secondary | ICD-10-CM

## 2012-12-12 DIAGNOSIS — J320 Chronic maxillary sinusitis: Secondary | ICD-10-CM

## 2012-12-12 NOTE — Progress Notes (Signed)
10/31/12- 66 yoM never smoker previously followed by Dr Joya Gaskins for moderate persistent asthma Former patient of [facility]; ? breathing issues vs allergies causing so many "colds"-mainly happens in fall season. Over the past 3 years he says he has had "too many colds". He was evaluated by Dr. Joya Gaskins for wheeze which resolved. Especially this year he feels he is getting too many head colds, reporting with 5 separate illnesses in the last 5 months. He occasionally spends time with school-age grandchildren who might bring colds. These episodes usually start with sneezing, especially in the fall. His current exacerbation began one day before this admission. He's had no fever. Sputum is clear but sometimes gets a little darker. No ear discomfort and no headache. Not needing inhalers. No ENT evaluation. Not much history of seasonal allergic rhinitis. CXR 12/18/11 IMPRESSION:  Negative exam.  Original Report Authenticated By: Angelita Ingles, M.D.   12/12/12- 66 yoM never smoker previously followed by Dr Joya Gaskins for moderate persistent asthma Former patient of [facility]; ? breathing issues vs allergies causing so many "colds"-mainly happens in fall season. Saw Dr. Harrington Challenger for cough/bronchitis, treated with doxycycline and benzonatate with relief. Expects colds in November/December routinely. CBC 10/31/12-  Platelets low 148 k; Eos high 9.6. Allergy profile 10/31/2012-total IgE 74. No elevation for any specific allergen on the panel.  ROS-see HPI Constitutional:   No-   weight loss, night sweats, fevers, chills, fatigue, lassitude. HEENT:   No-  headaches, difficulty swallowing, tooth/dental problems, sore throat,       No-  sneezing, itching, ear ache, nasal congestion, post nasal drip,  CV:  No-   chest pain, orthopnea, PND, swelling in lower extremities, anasarca,                                  dizziness, palpitations Resp: No-   shortness of breath with exertion or at rest.              No-   productive cough,  No  non-productive cough,  No- coughing up of blood.              No-   change in color of mucus.  No- wheezing.   Skin: No-   rash or lesions. GI:  No-   heartburn, indigestion, abdominal pain, nausea, vomiting,  GU:  MS:  No-   joint pain or swelling.   Neuro-     nothing unusual Psych:  No- change in mood or affect. No depression or anxiety.  No memory loss.  OBJ- Physical Exam General- Alert, Oriented, Affect-appropriate, Distress- none acute. Tall, Looks fit. Skin- rash-none, lesions- none, excoriation- none Lymphadenopathy- none Head- atraumatic            Eyes- Gross vision intact, PERRLA, conjunctivae and secretions clear            Ears- Hearing, canals-normal            Nose- + wet nose, no-Septal dev, mucus, polyps, erosion, perforation + turbinate edema            Throat- Mallampati II-III , mucosa clear , drainage- none, tonsils- atrophic Neck- flexible , trachea midline, no stridor , thyroid nl, carotid no bruit Chest - symmetrical excursion , unlabored           Heart/CV- RRR , no murmur , no gallop  , no rub, nl s1 s2                           -  JVD- none , edema- none, stasis changes- none, varices- none           Lung- clear to P&A, wheeze- none, cough- none , dullness-none, rub- none           Chest wall-  Abd-  Br/ Gen/ Rectal- Not done, not indicated Extrem- cyanosis- none, clubbing, none, atrophy- none, strength- nl Neuro- grossly intact to observation

## 2012-12-12 NOTE — Patient Instructions (Addendum)
Order- lab- Immunoglobulins  IgG, IgA, IgM     Dx recurrent bronchitis  Please call as needed

## 2012-12-13 ENCOUNTER — Telehealth: Payer: Self-pay | Admitting: Internal Medicine

## 2012-12-13 LAB — IGG, IGA, IGM: IgG (Immunoglobin G), Serum: 1920 mg/dL — ABNORMAL HIGH (ref 650–1600)

## 2012-12-13 NOTE — Progress Notes (Signed)
Quick Note:  LMTCB ______ 

## 2012-12-13 NOTE — Telephone Encounter (Signed)
Notes Recorded by Lorane Gell, CMA on 12/13/2012 at 2:23 PM [facility] ------  Notes Recorded by Deneise Lever, MD on 12/13/2012 at 11:03 AM Immunoglobulin antibody levels are a little higher than usual, but not enough to suggest a problem. Usually what matters is if they are too low.  Pt advised. Homecroft Bing, CMA

## 2012-12-15 NOTE — Assessment & Plan Note (Signed)
Question basis for his impression recurrent upper respiratory infections are too frequent. We discussed normal expectation  Plan -immunoglobulin levels

## 2013-05-23 ENCOUNTER — Other Ambulatory Visit: Payer: Self-pay | Admitting: Internal Medicine

## 2013-05-23 DIAGNOSIS — N183 Chronic kidney disease, stage 3 unspecified: Secondary | ICD-10-CM

## 2013-05-30 ENCOUNTER — Ambulatory Visit
Admission: RE | Admit: 2013-05-30 | Discharge: 2013-05-30 | Disposition: A | Discharge status: Home / Self Care | Payer: Medicare Other | Source: Ambulatory Visit | Attending: Internal Medicine | Admitting: Internal Medicine

## 2013-05-30 DIAGNOSIS — N183 Chronic kidney disease, stage 3 unspecified: Secondary | ICD-10-CM

## 2013-06-06 ENCOUNTER — Telehealth: Payer: Self-pay | Admitting: Internal Medicine

## 2013-06-06 NOTE — Telephone Encounter (Signed)
I spoke with pt and appt scheduled with TP. Nothing further needed

## 2013-06-06 NOTE — Telephone Encounter (Signed)
I spoke with pt. He c/o PND, loosing taste buds, dry cough, PND, wheezing at night x couple weeks. He is taking Allegra and using cough drops, He has pending appt 06/13/13 but would like to be seen sooner. Please advise Dr. Annamaria Boots thanks Last OV 12/12/12 No Known Allergies

## 2013-06-06 NOTE — Telephone Encounter (Signed)
Per CY-TP has openings and patient can come to see her on Wednesday. Thanks.

## 2013-06-07 ENCOUNTER — Ambulatory Visit (INDEPENDENT_AMBULATORY_CARE_PROVIDER_SITE_OTHER): Payer: Medicare Other | Admitting: Adult Health

## 2013-06-07 ENCOUNTER — Encounter: Payer: Self-pay | Admitting: Adult Health

## 2013-06-07 VITALS — BP 122/84 | HR 55 | Temp 98.2°F | Ht 74.0 in | Wt 241.6 lb

## 2013-06-07 DIAGNOSIS — J45909 Unspecified asthma, uncomplicated: Secondary | ICD-10-CM

## 2013-06-07 MED ORDER — BUDESONIDE-FORMOTEROL FUMARATE 160-4.5 MCG/ACT IN AERO: 2.0000 | INHALATION_SPRAY | Freq: Two times a day (BID) | RESPIRATORY_TRACT | Status: DC

## 2013-06-07 MED ORDER — AZITHROMYCIN 250 MG PO TABS: ORAL_TABLET | ORAL | Status: AC

## 2013-06-07 MED ORDER — LEVALBUTEROL HCL 0.63 MG/3ML IN NEBU
0.6300 mg | INHALATION_SOLUTION | Freq: Once | RESPIRATORY_TRACT | Status: AC
  Administered 2013-06-07: 0.63 mg via RESPIRATORY_TRACT

## 2013-06-07 MED ORDER — PREDNISONE 10 MG PO TABS: ORAL_TABLET | ORAL | Status: DC

## 2013-06-07 NOTE — Addendum Note (Signed)
Addended by: Parke Poisson E on: 06/07/2013 10:53 AM   Modules accepted: Orders

## 2013-06-07 NOTE — Patient Instructions (Addendum)
Zpack take as directed.  Prednisone taper over next week.  Mucinex DM Twice daily  As needed  Cough/congestion .  Saline nasal rinses As needed   follow up Dr. Annamaria Boots  In 3 months and As needed   Please contact office for sooner follow up if symptoms do not improve or worsen or seek emergency care

## 2013-06-07 NOTE — Progress Notes (Signed)
10/31/12- 66 yoM never smoker previously followed by Dr Joya Gaskins for moderate persistent asthma Former patient of [facility]; ? breathing issues vs allergies causing so many "colds"-mainly happens in fall season. Over the past 3 years he says he has had "too many colds". He was evaluated by Dr. Joya Gaskins for wheeze which resolved. Especially this year he feels he is getting too many head colds, reporting with 5 separate illnesses in the last 5 months. He occasionally spends time with school-age grandchildren who might bring colds. These episodes usually start with sneezing, especially in the fall. His current exacerbation began one day before this admission. He's had no fever. Sputum is clear but sometimes gets a little darker. No ear discomfort and no headache. Not needing inhalers. No ENT evaluation. Not much history of seasonal allergic rhinitis. CXR 12/18/11 IMPRESSION:  Negative exam.  Original Report Authenticated By: Angelita Ingles, M.D.   12/12/12- 66 yoM never smoker previously followed by Dr Joya Gaskins for moderate persistent asthma Former patient of [facility]; ? breathing issues vs allergies causing so many "colds"-mainly happens in fall season. Saw Dr. Harrington Challenger for cough/bronchitis, treated with doxycycline and benzonatate with relief. Expects colds in November/December routinely. CBC 10/31/12-  Platelets low 148 k; Eos high 9.6. Allergy profile 10/31/2012-total IgE 74. No elevation for any specific allergen on the panel.   06/07/2013 Acute OV  Complains of dry cough, PND, wheezing, loss of taste and smell x2 weeks - denies f/c/s, chest tightness, SOB, head congestion. No otc used.  Lots of sinus drainage. No fever, chest pain, leg swelling or n/v.  Coughing is keeping him up at night.     ROS-see HPI Constitutional:   No-   weight loss, night sweats, fevers, chills, fatigue, lassitude. HEENT:   No-  headaches, difficulty swallowing, tooth/dental problems, sore throat,       No-  sneezing, itching, ear ache,   +nasal congestion, post nasal drip,  CV:  No-   chest pain, orthopnea, PND, swelling in lower extremities, anasarca,                                  dizziness, palpitations Resp: No-   shortness of breath with exertion or at rest.              No-   productive cough,  ++ non-productive cough,  No- coughing up of blood.              No-   change in color of mucus.  No- wheezing.   Skin: No-   rash or lesions. GI:  No-   heartburn, indigestion, abdominal pain, nausea, vomiting,  GU:  MS:  No-   joint pain or swelling.   Neuro-     nothing unusual Psych:  No- change in mood or affect. No depression or anxiety.  No memory loss.  OBJ- Physical Exam General- Alert, Oriented, Affect-appropriate, Distress- none acute. Tall, Looks fit. Skin- rash-none, lesions- none, excoriation- none Lymphadenopathy- none Head- atraumatic            Eyes- Gross vision intact, PERRLA, conjunctivae and secretions clear            Ears- Hearing, canals-normal            Nose- no-Septal dev, mucus, polyps, erosion, perforation + turbinate edema            Throat- Mallampati II-III , mucosa clear , drainage- none, tonsils-  atrophic Neck- flexible , trachea midline, no stridor , thyroid nl, carotid no bruit Chest - symmetrical excursion , unlabored           Heart/CV- RRR , no murmur , no gallop  , no rub, nl s1 s2                           - JVD- none , edema- none, stasis changes- none, varices- none           Lung- few exp wheezes  none , dullness-none, rub- none           Chest wall-  Abd-  Br/ Gen/ Rectal- Not done, not indicated Extrem- cyanosis- none, clubbing, none, atrophy- none, strength- nl Neuro- grossly intact to observation

## 2013-06-07 NOTE — Assessment & Plan Note (Addendum)
Mild flare with bronchitis +/- sinusitis  xopenex  neb x 1   Plan  Zpack take as directed.  Prednisone taper over next week.  Mucinex DM Twice daily  As needed  Cough/congestion .  Saline nasal rinses As needed   follow up Dr. Annamaria Boots  In 3 months and As needed   Please contact office for sooner follow up if symptoms do not improve or worsen or seek emergency care

## 2013-06-13 ENCOUNTER — Ambulatory Visit: Payer: Medicare Other | Admitting: Internal Medicine

## 2013-06-26 ENCOUNTER — Telehealth: Payer: Self-pay | Admitting: Adult Health

## 2013-06-26 MED ORDER — AZELASTINE-FLUTICASONE 137-50 MCG/ACT NA SUSP: 1.0000 | Freq: Every day | NASAL | Status: DC

## 2013-06-26 NOTE — Telephone Encounter (Signed)
Pt aware of recs. Sample left for pick up. Nothing further needed

## 2013-06-26 NOTE — Telephone Encounter (Signed)
lmomtcb x1 for pt 

## 2013-06-26 NOTE — Telephone Encounter (Signed)
Per CY: ok to try sample of Dymista 1-2 sprays each nostril once daily at bedtime and make sure to brush/rinse/gargle after each use of the symbicort.  Thanks.

## 2013-06-26 NOTE — Telephone Encounter (Signed)
I spoke with pt. He saw TP on 06/07/13. Was giving ZPAK, pred taper, told to use mucinex DM and saline rinses. Pt stated he still has not regained all of his sense of smell and taste. He also just began to experience hoarseness. Pt reports he does rinse his mouth out after each use. Pt denies any cough, no wheezing and no chest tx, no increase SOB, no sore throat. Please advise Dr. Annamaria Boots thanks Pending 09/06/13 No Known Allergies

## 2013-07-07 ENCOUNTER — Telehealth: Payer: Self-pay | Admitting: Internal Medicine

## 2013-07-07 DIAGNOSIS — R430 Anosmia: Secondary | ICD-10-CM

## 2013-07-07 NOTE — Telephone Encounter (Signed)
Called and spoke with pt and he stated that he picked up sample of the dymista on 06/26/2013. Pt stated that this medication is still not working for him.  He stated that he is still having issues with his tastebuds and smell.  Pt stated that he can hardly taste or smell anything.  Pt is wanting further recs from [facility].  CY please advise. Thanks  No Known Allergies   Last ov--12/12/2012 Next ov--09/06/2013

## 2013-07-08 NOTE — Telephone Encounter (Signed)
I would like to refer him to [facility] for evaluation of lost sense of smell.

## 2013-07-10 NOTE — Telephone Encounter (Signed)
I spoke with pt and is aware of recs. Referral has been placed.

## 2013-09-06 ENCOUNTER — Ambulatory Visit: Payer: Medicare Other | Admitting: Internal Medicine

## 2013-09-12 ENCOUNTER — Other Ambulatory Visit: Payer: Self-pay | Admitting: Otolaryngology

## 2013-09-12 DIAGNOSIS — R430 Anosmia: Secondary | ICD-10-CM

## 2013-09-13 ENCOUNTER — Other Ambulatory Visit: Payer: Medicare Other

## 2013-09-18 ENCOUNTER — Other Ambulatory Visit: Payer: Self-pay | Admitting: Otolaryngology

## 2013-09-18 DIAGNOSIS — R430 Anosmia: Secondary | ICD-10-CM

## 2013-09-19 ENCOUNTER — Ambulatory Visit
Admission: RE | Admit: 2013-09-19 | Discharge: 2013-09-19 | Disposition: A | Discharge status: Home / Self Care | Payer: Medicare Other | Source: Ambulatory Visit | Attending: Otolaryngology | Admitting: Otolaryngology

## 2013-09-19 DIAGNOSIS — R430 Anosmia: Secondary | ICD-10-CM

## 2014-12-19 ENCOUNTER — Encounter: Payer: Self-pay | Admitting: Podiatry

## 2014-12-19 ENCOUNTER — Ambulatory Visit (INDEPENDENT_AMBULATORY_CARE_PROVIDER_SITE_OTHER): Payer: Medicare Other | Admitting: Podiatry

## 2014-12-19 VITALS — BP 153/76 | HR 70 | Ht 74.0 in | Wt 241.0 lb

## 2014-12-19 DIAGNOSIS — M79606 Pain in leg, unspecified: Secondary | ICD-10-CM | POA: Insufficient documentation

## 2014-12-19 DIAGNOSIS — B351 Tinea unguium: Secondary | ICD-10-CM | POA: Diagnosis not present

## 2014-12-19 HISTORY — DX: Tinea unguium: B35.1

## 2014-12-19 HISTORY — DX: Pain in leg, unspecified: M79.606

## 2014-12-19 NOTE — Patient Instructions (Signed)
Seen for deformed toe nail. Noted of thick nails with fungal infection. All nails debrided. Return in 3 months.

## 2014-12-19 NOTE — Progress Notes (Signed)
Subjective: 69 year old male presents complaining of deformed and fungal nail problem for about 2 years. He has difficulty managing the nail. Blood sugar is under control.  Review of systems negative other than history of kidney cancer, hypertension, and GERD.  Objective: Neurovascular status are within normal. Dermatologic: No abnormal skin lesions.  Deformed nail both great toes L>R. Orthopedic: Hyperextended hallux bilateral. Mild first ray elevation L>R. Tight Achilles tendon bilateral. Thick deformed nail L>R.  Assessment: Onychomycosis with nail deformity both great toes. Ankle Equinus bilateral. Pain in both great toes.  Plan: Clinical findings reviewed and all nails debrided. Return in 3 months or as needed.

## 2015-01-24 ENCOUNTER — Other Ambulatory Visit: Payer: Self-pay | Admitting: Adult Health

## 2015-03-22 ENCOUNTER — Ambulatory Visit: Payer: Medicare Other | Admitting: Podiatry

## 2015-03-27 ENCOUNTER — Ambulatory Visit (INDEPENDENT_AMBULATORY_CARE_PROVIDER_SITE_OTHER): Payer: Medicare Other | Admitting: Podiatry

## 2015-03-27 ENCOUNTER — Encounter: Payer: Self-pay | Admitting: Podiatry

## 2015-03-27 VITALS — BP 161/77 | HR 57 | Ht 74.0 in | Wt 241.0 lb

## 2015-03-27 DIAGNOSIS — B351 Tinea unguium: Secondary | ICD-10-CM

## 2015-03-27 DIAGNOSIS — M79606 Pain in leg, unspecified: Secondary | ICD-10-CM | POA: Diagnosis not present

## 2015-03-27 MED ORDER — CICLOPIROX 8 % EX SOLN: Freq: Every day | CUTANEOUS | Status: DC

## 2015-03-27 NOTE — Patient Instructions (Signed)
Seen for hypertrophic mycotic nails. All nails debrided. May benefit from Penlac. Rx. Given. Follow instruction. Return in 3 months or as needed.

## 2015-03-27 NOTE — Progress Notes (Signed)
Subjective: 69 year old male presents for follow up on fungal nails. He recently been to a beach and got 2nd toe nail blackened from walking. Blood sugar is under control.  Objective: Neurovascular status are within normal. Dermatologic: No abnormal skin lesions.  Deformed nail both great toes L>R. Orthopedic: Hyperextended hallux bilateral. Mild first ray elevation L>R. Tight Achilles tendon bilateral. Thick deformed nail L>R.  Assessment: Onychomycosis with nail deformity both great toes. Pain in both great toes.  Plan: Clinical findings reviewed and all nails debrided. May benefit from Penlac treatment for fungal nail.  Return in 3 months or as needed.

## 2015-06-28 ENCOUNTER — Ambulatory Visit: Payer: Medicare Other | Admitting: Podiatry

## 2015-11-25 ENCOUNTER — Telehealth: Payer: Self-pay | Admitting: Internal Medicine

## 2015-11-25 NOTE — Telephone Encounter (Signed)
Pt has not been seen since 2014. He c.o wheezing, increase SOB, cough. Pt has not tried PCP since we have no openings and will call them for appt. Will sign off message

## 2016-01-01 ENCOUNTER — Institutional Professional Consult (permissible substitution): Payer: Self-pay | Admitting: Pulmonary Disease

## 2016-01-07 ENCOUNTER — Telehealth: Payer: Self-pay | Admitting: Internal Medicine

## 2016-01-07 NOTE — Telephone Encounter (Signed)
Spoke with pt. He has been scheduled with CY on 03/30/16 at 10:15am. Nothing further was needed.

## 2016-01-09 ENCOUNTER — Institutional Professional Consult (permissible substitution): Payer: Self-pay | Admitting: Pulmonary Disease

## 2016-01-20 ENCOUNTER — Other Ambulatory Visit: Payer: Self-pay | Admitting: Internal Medicine

## 2016-01-20 DIAGNOSIS — N183 Chronic kidney disease, stage 3 unspecified: Secondary | ICD-10-CM

## 2016-01-20 DIAGNOSIS — I10 Essential (primary) hypertension: Secondary | ICD-10-CM

## 2016-01-22 ENCOUNTER — Other Ambulatory Visit: Payer: Self-pay | Admitting: Internal Medicine

## 2016-01-22 DIAGNOSIS — N183 Chronic kidney disease, stage 3 unspecified: Secondary | ICD-10-CM

## 2016-01-22 DIAGNOSIS — I10 Essential (primary) hypertension: Secondary | ICD-10-CM

## 2016-01-27 ENCOUNTER — Other Ambulatory Visit: Payer: Self-pay

## 2016-01-31 ENCOUNTER — Ambulatory Visit
Admission: RE | Admit: 2016-01-31 | Discharge: 2016-01-31 | Disposition: A | Discharge status: Home / Self Care | Payer: Medicare Other | Source: Ambulatory Visit | Attending: Internal Medicine | Admitting: Internal Medicine

## 2016-01-31 ENCOUNTER — Institutional Professional Consult (permissible substitution): Payer: Self-pay | Admitting: Pulmonary Disease

## 2016-01-31 DIAGNOSIS — N183 Chronic kidney disease, stage 3 unspecified: Secondary | ICD-10-CM

## 2016-01-31 DIAGNOSIS — I10 Essential (primary) hypertension: Secondary | ICD-10-CM

## 2016-02-03 ENCOUNTER — Other Ambulatory Visit: Payer: Self-pay | Admitting: Internal Medicine

## 2016-02-03 DIAGNOSIS — N183 Chronic kidney disease, stage 3 unspecified: Secondary | ICD-10-CM

## 2016-02-03 DIAGNOSIS — E1022 Type 1 diabetes mellitus with diabetic chronic kidney disease: Secondary | ICD-10-CM

## 2016-02-20 ENCOUNTER — Other Ambulatory Visit: Payer: Medicare Other

## 2016-03-30 ENCOUNTER — Institutional Professional Consult (permissible substitution): Payer: Self-pay | Admitting: Internal Medicine

## 2016-06-15 ENCOUNTER — Telehealth: Payer: Self-pay | Admitting: Internal Medicine

## 2016-06-16 NOTE — Telephone Encounter (Signed)
Pt has not been seen in over 3 years and CY is not taking new pulmonary consults; please set patient up with a new pulmonary MD in the office. Thanks.

## 2016-06-16 NOTE — Telephone Encounter (Signed)
Katie please advise of appt time we can use for this pt.  thanks

## 2016-06-16 NOTE — Telephone Encounter (Signed)
Spoke with pt's wife and left message for pt to call back to schedule Consult with any pulm md.

## 2016-06-17 ENCOUNTER — Encounter: Payer: Self-pay | Admitting: Pulmonary Disease

## 2016-06-17 ENCOUNTER — Ambulatory Visit (INDEPENDENT_AMBULATORY_CARE_PROVIDER_SITE_OTHER): Payer: Medicare Other | Admitting: Pulmonary Disease

## 2016-06-17 VITALS — BP 142/72 | HR 67 | Ht 74.0 in | Wt 234.4 lb

## 2016-06-17 DIAGNOSIS — J3089 Other allergic rhinitis: Secondary | ICD-10-CM

## 2016-06-17 DIAGNOSIS — J4540 Moderate persistent asthma, uncomplicated: Secondary | ICD-10-CM

## 2016-06-17 DIAGNOSIS — J302 Other seasonal allergic rhinitis: Secondary | ICD-10-CM | POA: Diagnosis not present

## 2016-06-17 HISTORY — DX: Other allergic rhinitis: J30.89

## 2016-06-17 MED ORDER — FLUTICASONE PROPIONATE 50 MCG/ACT NA SUSP: 1.0000 | Freq: Two times a day (BID) | NASAL | 3 refills | Status: DC

## 2016-06-17 MED ORDER — FLUTICASONE-SALMETEROL 100-50 MCG/DOSE IN AEPB: 1.0000 | INHALATION_SPRAY | Freq: Two times a day (BID) | RESPIRATORY_TRACT | 3 refills | Status: DC

## 2016-06-17 NOTE — Patient Instructions (Signed)
   Continue using your Advair twice daily.  I'm starting you on Flonase nasal spray - start using that just as you are finishing your Prednisone taper. Remember to point the tip away from your nasal septum when you spray.  Start using a nasal saline rinse twice daily (before your Flonase spray). I recommended NeilMed but you can use whatever you like.  Remember to get your Flu Vaccine in 2 weeks. We will do a pneumonia shot when you come back to see me.  I will be seeing you back in 2 months with breathing tests at that time.  TESTS ORDERED: 1. Full PFTs at follow-up

## 2016-06-17 NOTE — Progress Notes (Signed)
Subjective:    Patient ID: Paul Roach, male    DOB: [birth year], 70 y.o.   MRN: [id number]  HPI Patient seen remotely by our practice with a former physician and diagnosed with moderate, persistent asthma.  He reports he has "wheezing" 1-2 times per year and more in the Fall and early Spring. He reports previously he has been treated with Prednisone, antibiotics & nasal sprays to help with his symptoms. He has been on Advair 100-50 for a couple of weeks. He has been on Singulair for a long time. The Advair and antibiotics were started due to wheezing with laying recumbent. He isn't sure if he is wheezing during the day. He reports an intermittent cough productive of a "brown, green" mucus. He reports he does have sinus congestion & intermittent post-nasal drainage. He reports his wheezing has resolved since he started his medications along with a Prednisone taper but he still has sinus congestion. He reports he is using his rescue inhaler 3-4 times daily and it does seem to help. No nocturnal awakening with any breathing problems, coughing or wheezing. Denies any reflux, dyspepsia, or morning brash water taste. Denies any chest pain, tightness, or pressure.  Review of Systems No ever, chills, or sweats. No rashes or bruising. A pertinent 14 point review of systems is negative except as per the history of presenting illness.  No Known Allergies  Current Outpatient Prescriptions on File Prior to Visit  Medication Sig Dispense Refill  . chlorthalidone (HYGROTON) 25 MG tablet Take 25 mg by mouth daily.    . Multiple Vitamin (MULTIVITAMIN) tablet Take 1 tablet by mouth daily.     No current facility-administered medications on file prior to visit.     Past Medical History:  Diagnosis Date  . Allergic rhinitis   . Asthma   . Cancer of kidney ([place])   . History of kidney cancer   . Hypertension     Past Surgical History:  Procedure Laterality Date  . HEMORRHOID SURGERY    . right ankle  surgery    . right hip replacement    . right kidney removed  2000   for renal ca    Family History  Problem Relation Age of Onset  . Hypertension Mother   . Diabetes Mother   . Colon cancer Sister   . Lung disease Neg Hx     Social History   Social History  . Marital status: Widowed    Spouse name: N/A  . Number of children: 3  . Years of education: N/A   Occupational History  . Retired     Patent attorney   Social History Main Topics  . Smoking status: Never Smoker  . Smokeless tobacco: Never Used  . Alcohol use 0.0 oz/week     Comment: 1-2 drinks/month  . Drug use: No  . Sexual activity: Not Asked   Other Topics Concern  . None   Social History Narrative   Originally from [place]. Moved to [place] in 1996. Has also lived in [place] state. Has traveled to [place], [place], [place], [place], [place], [place], [place], [place], [place], [place], [place], [place], [place], [place], [place], [place], [place], [place], & [place]. 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 No pets currently. No bird, mold or hot tub exposure. Enjoys exercising regularly, fishing & traveling.       Objective:   Physical Exam BP (!) 142/72 (BP  Location: Left Arm, Cuff Size: Large)   Pulse 67   Ht 6\' 2"  (1.88 m)   Wt 234 lb 6.4 oz (106.3 kg)   SpO2 98%   BMI 30.10 kg/m  General:  Awake. Alert. No acute distress.  Integument:  Warm & dry. No rash on exposed skin. No bruising. Lymphatics:  No appreciated cervical or supraclavicular lymphadenoapthy. HEENT:  Moist mucus membranes. No oral ulcers. No scleral injection or icterus. Moderate to severe bilateral nasal turbinate swelling. Cardiovascular:  Regular rate. No edema. No appreciable JVD.  Pulmonary:  Good aeration & clear to auscultation bilaterally. Symmetric chest wall expansion. No accessory muscle use. Abdomen: Soft. Normal bowel sounds. Nondistended. Grossly  nontender. Musculoskeletal:  Normal bulk and tone. Hand grip strength 5/5 bilaterally. No joint deformity or effusion appreciated. Neurological:  CN 2-12 grossly in tact. No meningismus. Moving all 4 extremities equally. Symmetric brachioradialis deep tendon reflexes. Psychiatric:  Mood and affect congruent. Speech normal rhythm, rate & tone.   PFT 12/17/11: FVC 3.32 L (75%) FEV1 2.7 L (80%) FEV1/FVC 0.82 FEF 25-75 2.84 L (83%)  LABS 12/12/12 IgG: 1920 IgA: 439 IgM: 169  10/31/12 CBC:  5.9/14.4/41.9/148 IgE:  74 RAST Panel:  Negative    Assessment & Plan:  70 y.o. male with underlying moderate, persistent asthma as well as allergic rhinitis Suspect most of the patient's symptoms are originating from his allergic rhinitis given his physical exam findings today. He has no symptoms of underlying reflux. Reviewing his prior spirometry shows no evidence of fixed airway obstruction. Given the seasonal variation in his symptoms I feel that he will likely need intermittent asthma controller medication use. I instructed the patient contact my office if he had any further questions or concerns before her next appointment.   1. Moderate, Persistent Asthma:   Continuing on current prednisone taper. Continuing patient on Advair 100/50 one inhalation twice a day. Plan to check full pulmonary function testing at follow-up appointment. 2. Seasonal Allergic Rhinitis: Continuing patient on Singulair. Recommended using nasal saline rinse twice daily. Plan to initiate Flonase at the end of prednisone taper. Plan to check serum RAST panel again once patient is off steroid therapy. Consider referral to allergy for skin testing at next appointment. 3. Health Maintenance:  S/P Pneumovax 27 July 2009. Recommended influenza vaccine in 2 weeks. Plan for Prevnar vaccine at next appointment. 4. Follow-up: Patient to return to clinic in 2 months worse. If needed.  Sonia Baller Ashok Cordia, M.D. [facility] Pulmonary & Critical  Care Pager:  [phone] After 3pm or if no response, call [phone] 10:16 AM 06/17/16

## 2016-06-19 NOTE — Telephone Encounter (Signed)
Pt is already scheduled to see JN. Will sign off message

## 2016-08-21 ENCOUNTER — Ambulatory Visit: Payer: Medicare Other | Admitting: Pulmonary Disease

## 2016-10-08 ENCOUNTER — Other Ambulatory Visit (INDEPENDENT_AMBULATORY_CARE_PROVIDER_SITE_OTHER): Payer: Medicare Other

## 2016-10-08 ENCOUNTER — Encounter: Payer: Self-pay | Admitting: Pulmonary Disease

## 2016-10-08 ENCOUNTER — Ambulatory Visit (INDEPENDENT_AMBULATORY_CARE_PROVIDER_SITE_OTHER): Payer: Medicare Other | Admitting: Pulmonary Disease

## 2016-10-08 VITALS — BP 132/76 | HR 65 | Ht 73.0 in | Wt 238.8 lb

## 2016-10-08 DIAGNOSIS — J4540 Moderate persistent asthma, uncomplicated: Secondary | ICD-10-CM

## 2016-10-08 DIAGNOSIS — J302 Other seasonal allergic rhinitis: Secondary | ICD-10-CM

## 2016-10-08 DIAGNOSIS — Z23 Encounter for immunization: Secondary | ICD-10-CM

## 2016-10-08 LAB — CBC WITH DIFFERENTIAL/PLATELET
BASOS ABS: 0 10*3/uL (ref 0.0–0.1)
Basophils Relative: 0.4 % (ref 0.0–3.0)
EOS ABS: 0.6 10*3/uL (ref 0.0–0.7)
EOS PCT: 11.6 % — AB (ref 0.0–5.0)
HCT: 41.7 % (ref 39.0–52.0)
HEMOGLOBIN: 14.3 g/dL (ref 13.0–17.0)
LYMPHS ABS: 1.6 10*3/uL (ref 0.7–4.0)
Lymphocytes Relative: 31 % (ref 12.0–46.0)
MCHC: 34.3 g/dL (ref 30.0–36.0)
MCV: 93.9 fl (ref 78.0–100.0)
MONO ABS: 0.6 10*3/uL (ref 0.1–1.0)
Monocytes Relative: 12 % (ref 3.0–12.0)
NEUTROS PCT: 45 % (ref 43.0–77.0)
Neutro Abs: 2.3 10*3/uL (ref 1.4–7.7)
Platelets: 194 10*3/uL (ref 150.0–400.0)
RBC: 4.44 Mil/uL (ref 4.22–5.81)
RDW: 13.9 % (ref 11.5–15.5)
WBC: 5.1 10*3/uL (ref 4.0–10.5)

## 2016-10-08 NOTE — Patient Instructions (Addendum)
   Continue using your Advair inhaler & Singulair (montelukast) as prescribed.  I will see you back in 8 weeks to discuss your results or sooner if needed.   TESTS ORDERED: 1. Serum RAST Panel & CBC w/ Differential today 2. Full PFTs before follow-up appointment  3. Maxillofacial CT LTD w/o before next appointment

## 2016-10-08 NOTE — Progress Notes (Signed)
Subjective:    Patient ID: Paul Roach, male    DOB: September 16, 1946, 71 y.o.   MRN: [id number]  C.C.:  Follow-up for Moderate, Persistent Asthma & Chronic Seasonal Allergic Rhinitis.   HPI Moderate, Persistent Asthma:  Prescribed Advair 100/50 & Singulair. He denies any significant cough or wheezing. He has noticed wheezing when he is off his Singulair. He reports he is using his ProAir inhaler routinely 2-3 times daily. He hasn't been using it as a rescue medication. No exacerbations since last appointment.  Chronic Seasonal Allergic Rhinitis:  Prescribed Singulair. Reports he still has significant sinus congestion with drainage. He reports he does produce a "brown" colored mucus intermittent. Denies any facial pain. He is using his nasal saline rinse once daily & Flonase twice daily. Previously had ENT surgery in [place]  Review of Systems Reports his voice is intermittently hoarse. No chest pain or pressure. No fever, chills, or sweats. No abdominal pain or nausea.   No Known Allergies  Current Outpatient Prescriptions on File Prior to Visit  Medication Sig Dispense Refill  . chlorthalidone (HYGROTON) 25 MG tablet Take 25 mg by mouth daily.    . fluticasone (FLONASE) 50 MCG/ACT nasal spray Place 1 spray into both nostrils 2 (two) times daily. 16 g 3  . Fluticasone-Salmeterol (ADVAIR) 100-50 MCG/DOSE AEPB Inhale 1 puff into the lungs 2 (two) times daily. 60 each 3  . hydrochlorothiazide (HYDRODIURIL) 12.5 MG tablet Take 12.5 mg by mouth. 3 days a week    . labetalol (NORMODYNE) 200 MG tablet Take 200 mg by mouth 2 (two) times daily.    . montelukast (SINGULAIR) 10 MG tablet 1 tablet once daily    . Multiple Vitamin (MULTIVITAMIN) tablet Take 1 tablet by mouth daily.    Marland Kitchen PROAIR RESPICLICK 174 (90 Base) MCG/ACT AEPB 2 puffs 2-3 times daily     No current facility-administered medications on file prior to visit.     Past Medical History:  Diagnosis Date  . Allergic rhinitis   .  Asthma   . Cancer of kidney ([place])   . History of kidney cancer   . Hypertension     Past Surgical History:  Procedure Laterality Date  . HEMORRHOID SURGERY    . right ankle surgery    . right hip replacement    . right kidney removed  2000   for renal ca    Family History  Problem Relation Age of Onset  . Hypertension Mother   . Diabetes Mother   . Colon cancer Sister   . Lung disease Neg Hx     Social History   Social History  . Marital status: Widowed    Spouse name: N/A  . Number of children: 3  . Years of education: N/A   Occupational History  . Retired     Patent attorney   Social History Main Topics  . Smoking status: Never Smoker  . Smokeless tobacco: Never Used  . Alcohol use 0.0 oz/week     Comment: 1-2 drinks/month  . Drug use: No  . Sexual activity: Not Asked   Other Topics Concern  . None   Social History Narrative   Originally from [place]. Moved to  in 1996. Has also lived in [place] state. Has traveled to [place], [place], [place], [place], [place], [place], [place], [place], [place], [place], [place], [place], [place], [place], [place], [place], [place], [place], & [place]. 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 No pets currently. No bird, mold or hot tub exposure. Enjoys exercising regularly, fishing & traveling.       Objective:   Physical Exam BP 132/76 (BP Location: Left Arm, Cuff Size: Normal)   Pulse 65   Ht 6\' 1"  (1.854 m)   Wt 238 lb 12.8 oz (108.3 kg)   SpO2 98%   BMI 31.51 kg/m  General:  Awake. Alert. No distress. Wife with patient today. Integument:  Warm & dry. No rash on exposed skin. No bruising on exposed skin. Lymphatics:  No appreciated cervical or supraclavicular lymphadenoapthy. HEENT:  Moist mucus membranes. No oral ulcers. Severe right sided nasal turbinate swelling with erythematous mucosa. Only mild mucosal thickening with his left nasal  turbinates. Cardiovascular:  Regular rate. No edema. Normal S1 & S2. Pulmonary:  Speaking in complete sentences. Clear with auscultation. Normal work of breathing on room air. Abdomen: Soft. Normal bowel sounds. Nontender.  PFT 12/17/11: FVC 3.32 L (75%) FEV1 2.7 L (80%) FEV1/FVC 0.82 FEF 25-75 2.84 L (83%)  IMAGING CXR PA/LAT 06/10/16 (personally reviewed by me):  No parenchymal nodule or opacity. No pleural effusion. Heart normal in size & mediastinum normal in contour.  LABS 12/12/12 IgG: 1920 IgA: 439 IgM: 169  10/31/12 CBC:  5.9/14.4/41.9/148 IgE:  74 RAST Panel:  Negative    Assessment & Plan:  71 y.o. male with underlying moderate, persistent asthma as well as chronic seasonal allergic rhinitis.Symptomatically seems reasonably controlled. However, patient is continuing to have cough and sinus congestion with drainage. I reviewed his previous chest x-ray from September which shows no parenchymal cause to his cough. I explained that with his underlying asthma and allergic rhinitis he may be on medication for the rest of his life. I instructed the patient to contact my office if he had any new breathing problems before his next appointment.  1. Moderate, Persistent Asthma:   Continuing Singulair & Advair 100/50. Patient educated on proper use of rescue medication. Checking full pulmonary function testing before next appointment. 2. Chronic Seasonal Allergic Rhinitis: Continuing Flonase & Singulair as prescribed. Checking serum CBC with differential & RAST panel today. Checking maxillofacial CT scan without contrast. Consider referral to ENT & Allergist depending upon result. 3. Health Maintenance:  S/P Pneumovax 27 July 2009. Recommended influenza vaccine in 2 weeks. Administering Prevnar 13 Vaccine today.  4. Follow-up: Patient to return to clinic in 8 weeks or sooner if needed.  Sonia Baller Ashok Cordia, M.D. [facility] Pulmonary & Critical Care Pager:  [phone] After 3pm or if no  response, call [phone] 11:05 AM 10/08/16

## 2016-10-08 NOTE — Addendum Note (Signed)
Addended by: Parke Poisson E on: 10/08/2016 11:41 AM   Modules accepted: Orders

## 2016-10-08 NOTE — Addendum Note (Signed)
Addended by: Parke Poisson E on: 10/08/2016 12:50 PM   Modules accepted: Orders

## 2016-10-08 NOTE — Addendum Note (Signed)
Addended by: Parke Poisson E on: 10/08/2016 11:31 AM   Modules accepted: Orders

## 2016-10-09 LAB — RESPIRATORY ALLERGY PROFILE REGION II ~~LOC~~
Allergen, A. alternata, m6: <0.1 kU/L
Allergen, Comm Silver Birch, t9: <0.1 kU/L
Allergen, D pternoyssinus,d7: <0.1 kU/L
Allergen, Oak,t7: <0.1 kU/L
Box Elder IgE: <0.1 kU/L
Cat Dander: <0.1 kU/L
Cockroach: <0.1 kU/L
D. farinae: <0.1 kU/L
IGE (IMMUNOGLOBULIN E), SERUM: 187 kU/L — AB (ref <115)
Johnson Grass: <0.1 kU/L
Rough Pigweed  IgE: <0.1 kU/L
Sheep Sorrel IgE: <0.1 kU/L

## 2016-10-14 ENCOUNTER — Ambulatory Visit (INDEPENDENT_AMBULATORY_CARE_PROVIDER_SITE_OTHER)
Admission: RE | Admit: 2016-10-14 | Discharge: 2016-10-14 | Disposition: A | Discharge status: Home / Self Care | Payer: Medicare Other | Source: Ambulatory Visit | Attending: Pulmonary Disease | Admitting: Pulmonary Disease

## 2016-10-14 DIAGNOSIS — J4540 Moderate persistent asthma, uncomplicated: Secondary | ICD-10-CM

## 2016-10-16 ENCOUNTER — Other Ambulatory Visit: Payer: Self-pay

## 2016-10-16 DIAGNOSIS — J320 Chronic maxillary sinusitis: Secondary | ICD-10-CM

## 2016-10-20 ENCOUNTER — Telehealth: Payer: Self-pay | Admitting: Pulmonary Disease

## 2016-10-20 DIAGNOSIS — J320 Chronic maxillary sinusitis: Secondary | ICD-10-CM

## 2016-10-20 NOTE — Telephone Encounter (Signed)
Notes Recorded by Javier Glazier, MD on 10/15/2016 at 6:03 PM EST Please let the patient know that his sinus CT scan shows extensive paranasal sinus disease in all sinuses. There appears to be some polyps that are causing obstruction. We need to refer him to ENT for an evaluation. Thanks. ----------------------------------------- Pt is being referred back to ENT because of his CT sinus results which are listed above. Per Ashley's documentation he was made aware of these results. lmtcb x1 for pt.

## 2016-10-20 NOTE — Telephone Encounter (Signed)
Called and spoke with pt and explained these results to him again and he stated that he understands these results and does not want to see ENT---he does not want to have surgery again.    Pt wanted to know the results of his lab results.  JN please advise thanks.

## 2016-10-20 NOTE — Telephone Encounter (Signed)
Patient is returning call, CB is [phone].

## 2016-10-21 NOTE — Telephone Encounter (Signed)
Patient's blood work didn't suggest allergies; however, there were no specific antigens (trees, grasses, or animals) he is reacting to that I can find in his blood work. Thank you.

## 2016-10-23 NOTE — Telephone Encounter (Signed)
Spoke with pt. He is aware of lab results. Pt wants to know what the next step is since he is still having the same issues.  JN - please advise. Thanks.

## 2016-10-27 NOTE — Telephone Encounter (Signed)
The only other option would be referring him for allergy testing using the skin tests and see if there is any immunotherapy/shots that could help his symptoms. Beyond that I don't know of a way to improve his sinus symptoms. My thought was that ENT could potentially open up the blockages that we are seeing on the CT scan but if he doesn't want to pursue that option it is his choice. Have him let us know what he wants to do.

## 2016-10-27 NOTE — Telephone Encounter (Signed)
LMTCB

## 2016-10-27 NOTE — Telephone Encounter (Signed)
lmomtcb x1 

## 2016-10-27 NOTE — Telephone Encounter (Signed)
[phone] calling back

## 2016-11-23 NOTE — Telephone Encounter (Signed)
Spoke with pt, who states he would like to proccedd with allergy testing.  Referral has been placed. Nothing further needed.  Routing to [employer] as a FYI.

## 2016-12-07 ENCOUNTER — Ambulatory Visit (INDEPENDENT_AMBULATORY_CARE_PROVIDER_SITE_OTHER): Payer: Medicare Other | Admitting: Pulmonary Disease

## 2016-12-07 ENCOUNTER — Telehealth: Payer: Self-pay | Admitting: Pulmonary Disease

## 2016-12-07 ENCOUNTER — Encounter: Payer: Self-pay | Admitting: Pulmonary Disease

## 2016-12-07 VITALS — BP 160/100 | HR 56 | Ht 73.0 in | Wt 238.4 lb

## 2016-12-07 DIAGNOSIS — J4540 Moderate persistent asthma, uncomplicated: Secondary | ICD-10-CM | POA: Diagnosis not present

## 2016-12-07 DIAGNOSIS — J302 Other seasonal allergic rhinitis: Secondary | ICD-10-CM

## 2016-12-07 DIAGNOSIS — I10 Essential (primary) hypertension: Secondary | ICD-10-CM | POA: Diagnosis not present

## 2016-12-07 LAB — PULMONARY FUNCTION TEST
DL/VA % PRED: 94 %
DL/VA: 4.51 ml/min/mmHg/L
DLCO COR: 27.08 ml/min/mmHg
DLCO UNC % PRED: 75 %
DLCO cor % pred: 73 %
DLCO unc: 28.09 ml/min/mmHg
FEF 25-75 POST: 2.58 L/s
FEF 25-75 PRE: 1.4 L/s
FEF2575-%CHANGE-POST: 84 %
FEF2575-%PRED-POST: 94 %
FEF2575-%Pred-Pre: 51 %
FEV1-%Change-Post: 17 %
FEV1-%PRED-PRE: 73 %
FEV1-%Pred-Post: 86 %
FEV1-Post: 2.82 L
FEV1-Pre: 2.41 L
FEV1FVC-%CHANGE-POST: 5 %
FEV1FVC-%PRED-PRE: 88 %
FEV6-%CHANGE-POST: 11 %
FEV6-%Pred-Post: 94 %
FEV6-%Pred-Pre: 84 %
FEV6-Post: 3.91 L
FEV6-Pre: 3.51 L
FEV6FVC-%Change-Post: 0 %
FEV6FVC-%Pred-Post: 102 %
FEV6FVC-%Pred-Pre: 102 %
FVC-%Change-Post: 10 %
FVC-%PRED-PRE: 82 %
FVC-%Pred-Post: 91 %
FVC-POST: 3.95 L
FVC-PRE: 3.57 L
POST FEV1/FVC RATIO: 71 %
POST FEV6/FVC RATIO: 99 %
PRE FEV1/FVC RATIO: 68 %
Pre FEV6/FVC Ratio: 98 %
RV % pred: 137 %
RV: 3.71 L
TLC % pred: 97 %
TLC: 7.52 L

## 2016-12-07 NOTE — Progress Notes (Signed)
Subjective:    Patient ID: Paul Roach, male    DOB: [birth year], 71 y.o.   MRN: [id number]  C.C.:  Follow-up for Moderate, Persistent Asthma & Chronic Seasonal Allergic Rhinitis.   HPI Moderate, persistent asthma: Currently prescribed Advair 100/50 & Singulair.Patient does have mild obstruction pre-bronchodilator challenge on his pulmonary function testing today but this resolves with his significant bronchodilator response. He reports he is compliant with his inhaler. He reports he hasn't noticed any new coughing or wheezing. He reports he is using his rescue inhaler once a week. No nocturnal awakenings with any coughing or wheezing. No exacerbations since last appointment.   Chronic seasonal allergic rhinitis: Currently prescribed Singulair, & Flonase twice daily. Previously followed by ENT in [place]. Patient declined ENT referral with no desire to undergo further surgery since last appointment. Was referred to allergy/immunology for further testing. He reports his sinus congestion has improved since he was prescribed a Z-pack. Reports continued sinus congestion bilaterally.   Review of Systems No fever, chills, or sweats. No chest pain or pressure. No abdominal pain or nausea. Denies any headache, vision changes, or focal weakness, numbness, or tingling.  No Known Allergies  Current Outpatient Prescriptions on File Prior to Visit  Medication Sig Dispense Refill  . chlorthalidone (HYGROTON) 25 MG tablet Take 25 mg by mouth daily.    . fluticasone (FLONASE) 50 MCG/ACT nasal spray Place 1 spray into both nostrils 2 (two) times daily. 16 g 3  . Fluticasone-Salmeterol (ADVAIR) 100-50 MCG/DOSE AEPB Inhale 1 puff into the lungs 2 (two) times daily. 60 each 3  . hydrochlorothiazide (HYDRODIURIL) 12.5 MG tablet Take 12.5 mg by mouth. 3 days a week    . labetalol (NORMODYNE) 200 MG tablet Take 200 mg by mouth 2 (two) times daily.    . montelukast (SINGULAIR) 10 MG tablet 1 tablet once daily      . Multiple Vitamin (MULTIVITAMIN) tablet Take 1 tablet by mouth daily.    Marland Kitchen PROAIR RESPICLICK 353 (90 Base) MCG/ACT AEPB 2 puffs 2-3 times daily     No current facility-administered medications on file prior to visit.     Past Medical History:  Diagnosis Date  . Allergic rhinitis   . Asthma   . Cancer of kidney ([place])   . History of kidney cancer   . Hypertension     Past Surgical History:  Procedure Laterality Date  . HEMORRHOID SURGERY    . right ankle surgery    . right hip replacement    . right kidney removed  2000   for renal ca    Family History  Problem Relation Age of Onset  . Hypertension Mother   . Diabetes Mother   . Colon cancer Sister   . Lung disease Neg Hx     Social History   Social History  . Marital status: Widowed    Spouse name: N/A  . Number of children: 3  . Years of education: N/A   Occupational History  . Retired     Patent attorney   Social History Main Topics  . Smoking status: Never Smoker  . Smokeless tobacco: Never Used  . Alcohol use 0.0 oz/week     Comment: 1-2 drinks/month  . Drug use: No  . Sexual activity: Not Asked   Other Topics Concern  . None   Social History Narrative   Originally from [place]. Moved to [place] in 1996. Has also lived in [place] state. Has traveled to [place], [place], [place],  [place], [place], [place], [place], [place], [place], [place], [place], [place], [place], [place], [place], [place], [place], [place], & [place]. 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 No pets currently. No bird, mold or hot tub exposure. Enjoys exercising regularly, fishing & traveling.       Objective:   Physical Exam BP (!) 170/100 (BP Location: Right Arm, Patient Position: Sitting, Cuff Size: Large)   Pulse (!) 56   Ht 6\' 1"  (1.854 m)   Wt 238 lb 6.4 oz (108.1 kg)   SpO2 99%   BMI 31.45 kg/m  Gen.: No distress. Wife with patient today. Alert. Integument:  Warm and dry. Without rash or bruising on exposed skin. HEENT: Severe bilateral nasal turbinate swelling with erythematous mucosa. No scleral icterus. Moist mucous membranes. Pulmonary: Good aeration bilaterally. Clear with auscultation. No accessory muscle use on room air. Cardiovascular: Regular rate and rhythm. No edema. Normal S1 & S2. Abdomen: Soft. Nondistended. Normal bowel sounds.  PFT 12/07/16: FVC 3.57 L (82%) FEV1 2.41 L (73%) FEV1/FVC 0.68 FEF 25-75 1.40 L (81%) positive bronchodilator response TLC 7.52 L (97%) RV 137% ERV 95% DLCO corrected 73% 12/17/11: FVC 3.32 L (75%) FEV1 2.71 L (80%) FEV1/FVC 0.82 FEF 25-75 2.84 L (83%)  IMAGING CT MAXILLOFACIAL LTD W/O 10/14/16 (per radiologist): Extensive paranasal sinus disease with disease in all sinus regions. There've been apparently antrostomies on the right with polyp extending through the antrostomy defect on the right into the right naris causing superior right naris obstruction. No air-fluid levels. No bony destruction expansion evident. Ostiomeatal unit complexes are obstructed bilaterally. Mild rightward deviation of nasal septum.  CXR PA/LAT 06/10/16 (previously reviewed by me):  No parenchymal nodule or opacity. No pleural effusion. Heart normal in size & mediastinum normal in contour.  LABS 10/08/16 CBC: 5.1/14.3/41.7/194 Eosinophil: 0.6 IgE: 187 RAST panel: Negative  12/12/12 IgG: 1920 IgA: 439 IgM: 169  10/31/12 CBC:  5.9/14.4/41.9/148 IgE:  74 RAST Panel:  Negative    Assessment & Plan:  71 y.o. male with moderate, persistent asthma & chronic seasonal allergic rhinitis. Despite significant worsening in the patient's spirometry he has minimal symptoms from his underlying asthma. He does have a significant bronchodilator response today which is likely reflective of his underlying asthma. I did caution the patient against potential need to escalate inhaled corticosteroid therapy if he develops any new symptoms and he will  notify me if this occurs. We did discuss his chronic seasonal allergic rhinitis at length as well as has significantly abnormal maxillofacial CT scan. I believe he would benefit from evaluation by ENT for possible repeat surgery to correct the obstructing polyps noted above. We also discussed his significantly elevated blood pressure and the potential risk of stroke. He is going to further address his high blood pressure with his primary care physician today. We reviewed the symptoms of a stroke and I instructed him to seek immediate medical attention if these occurred.  1. Moderate, persistent asthma:  Continuing patient on Singulair and Advair 100/50. Continue increasing dose of inhaled corticosteroid if patient develops symptoms. 2. Chronic seasonal allergic rhinitis:  Continuing patient on Singulair & Flonase. Referring patient to [facility] ENT for evaluation. Also ensuring patient has a referral to allergy/immunology hearing [place]. 3. Essential hypertension:  Patient continuing on his home regimen. He will address this further with his primary care physician today. 4. Health maintenance: Status post influenza vaccine October 2017, Prevnar January 2018, &  Pneumovax October 2010. 5. Follow-up: Return to clinic in 3 months or sooner if needed.  Sonia Baller Ashok Cordia, M.D. [facility] Pulmonary & Critical Care Pager:  [phone] After 3pm or if no response, call [phone] 10:43 AM 12/07/16

## 2016-12-07 NOTE — Telephone Encounter (Signed)
IMAGING CT MAXILLOFACIAL LTD W/O 10/14/16 (per radiologist): Extensive paranasal sinus disease with disease in all sinus regions. There've been apparently antrostomies on the right with polyp extending through the antrostomy defect on the right into the right naris causing superior right naris obstruction. No air-fluid levels. No bony destruction expansion evident. Ostiomeatal unit complexes are obstructed bilaterally. Mild rightward deviation of nasal septum.  LABS 10/08/16 CBC: 5.1/14.3/41.7/194 Eosinophil: 0.6 IgE: 187 RAST panel: Negative

## 2016-12-07 NOTE — Patient Instructions (Addendum)
   Continue taking your medications as prescribed.  Talk with your primary care physician about your high blood pressure.  I will see you back in 3 months or sooner if needed.

## 2016-12-07 NOTE — Progress Notes (Signed)
Test reviewed.  

## 2016-12-16 ENCOUNTER — Ambulatory Visit (INDEPENDENT_AMBULATORY_CARE_PROVIDER_SITE_OTHER): Payer: Medicare Other | Admitting: Allergy and Immunology

## 2016-12-16 ENCOUNTER — Encounter: Payer: Self-pay | Admitting: Allergy and Immunology

## 2016-12-16 VITALS — BP 150/90 | HR 61 | Temp 97.8°F | Resp 16 | Ht 73.0 in | Wt 236.8 lb

## 2016-12-16 DIAGNOSIS — J320 Chronic maxillary sinusitis: Secondary | ICD-10-CM

## 2016-12-16 DIAGNOSIS — I10 Essential (primary) hypertension: Secondary | ICD-10-CM | POA: Diagnosis not present

## 2016-12-16 DIAGNOSIS — J4541 Moderate persistent asthma with (acute) exacerbation: Secondary | ICD-10-CM

## 2016-12-16 DIAGNOSIS — J339 Nasal polyp, unspecified: Secondary | ICD-10-CM

## 2016-12-16 DIAGNOSIS — J3089 Other allergic rhinitis: Secondary | ICD-10-CM

## 2016-12-16 HISTORY — DX: Nasal polyp, unspecified: J33.9

## 2016-12-16 MED ORDER — BUDESONIDE-FORMOTEROL FUMARATE 160-4.5 MCG/ACT IN AERO: 2.0000 | INHALATION_SPRAY | Freq: Two times a day (BID) | RESPIRATORY_TRACT | 5 refills | Status: DC

## 2016-12-16 MED ORDER — BUDESONIDE 0.5 MG/2ML IN SUSP: 0.5000 mg | Freq: Two times a day (BID) | RESPIRATORY_TRACT | 5 refills | Status: DC

## 2016-12-16 NOTE — Assessment & Plan Note (Signed)
   Aeroallergen avoidance measures have been discussed and provided in written form.  Budesonide/saline irrigation twice a day (as above).  For thick post nasal drainage, add guaifenesin 1200 mg (Mucinex Maximum Strength)  twice daily as needed with adequate hydration as discussed.

## 2016-12-16 NOTE — Patient Instructions (Addendum)
Nasal polyposis  Start budesonide/saline nasal irrigation twice a day.  A prescription has been provided for budesonide 0.5 mg respules and instructions for mixing and adminstering the rinse have been discussed and provided in written form.  To jumpstart symptom relief, prednisone has been provided, 40 mg x3 days, 20 mg x1 day, 10 mg x1 day, then stop.  Perennial allergic rhinitis with nonallergic component  Aeroallergen avoidance measures have been discussed and provided in written form.  Budesonide/saline irrigation twice a day (as above).  For thick post nasal drainage, add guaifenesin 1200 mg (Mucinex Maximum Strength)  twice daily as needed with adequate hydration as discussed.  Moderate persistent asthma Today's spirometry results, assessed while asymptomatic, suggest under-perception of bronchoconstriction.  A prescription has been provided for Symbicort (budesonide/formoterol) 160/4.5 g, 2 inhalations twice a day. To maximize pulmonary deposition, a spacer has been provided along with instructions for its proper administration with an HFA inhaler.  For now, continue montelukast 10 mg daily at bedtime and albuterol HFA, 1-2 inhalations every 4-6 hours as needed.  Subjective and objective measures of pulmonary function will be followed and the treatment plan will be adjusted accordingly.  As he has nasal polyposis and persistent asthma, an observed aspirin challenge may be prudent should he be asked to take aspirin in the future.  Essential hypertension  Decongestants, including pseudoephedrine and phenylephrine will be avoided in this patient.   Return in about 2 months (around 02/15/2017), or if symptoms worsen or fail to improve.   Budesonide (Pulmicort) + Saline Irrigation/Rinse  Budesonide (Pulmicort) is an anti-inflammatory steroid medication used to decrease nasal and sinus inflammation. It is dispensed in liquid form in a vial. Although it is manufactured for use with  a nebulizer, we intend for you to use it with the NeilMed Sinus Rinse bottle (preferred) or a Neti pot.   Instructions:  1) Make 240cc of saline in the NeilMed bottle using the salt packets or your own saline recipe (see separate handout).  2) Add the entire 2cc vial of liquid Budesonide (Pulmicort) to the rinse bottle and mix together.  3) While in the shower or over the sink, tilt your head forward to a comfortable level. Put the tip of the sinus rinse bottle in your nostril and aim it towards the crown or top of your head. Gently squeeze the bottle to flush out your nose. The fluid will circulate in and out of your sinus cavities, coming back out from either nostril or through your mouth. Try not to swallow large quantities and spit it out instead.  4) Perform Budesonide (Pulmicort) + Saline irrigations 2 times daily.  Control of Mold Allergen  Mold and fungi can grow on a variety of surfaces provided certain temperature and moisture conditions exist.  Outdoor molds grow on plants, decaying vegetation and soil.  The major outdoor mold, Alternaria and Cladosporium, are found in very high numbers during hot and dry conditions.  Generally, a late Summer - Fall peak is seen for common outdoor fungal spores.  Rain will temporarily lower outdoor mold spore count, but counts rise rapidly when the rainy period ends.  The most important indoor molds are Aspergillus and Penicillium.  Dark, humid and poorly ventilated basements are ideal sites for mold growth.  The next most common sites of mold growth are the bathroom and the kitchen.  Outdoor [employer] 1. Use air conditioning and keep windows closed 2. Avoid exposure to decaying vegetation. 3. Avoid leaf raking. 4. Avoid grain handling.  5. Consider wearing a face mask if working in moldy areas.  Indoor Mold Control 1. Maintain humidity below 50%. 2. Clean washable surfaces with 5% bleach solution. 3. Remove sources e.g. Contaminated  carpets.

## 2016-12-16 NOTE — Assessment & Plan Note (Signed)
   Decongestants, including pseudoephedrine and phenylephrine will be avoided in this patient.

## 2016-12-16 NOTE — Progress Notes (Signed)
New Patient Note  RE: Paul Roach MRN: [id number] DOB: June 22, 1946 Date of Office Visit: 12/16/2016  Referring provider: [name], MD Primary care provider: Melinda Crutch, MD  Chief Complaint: Asthma; Sinus Problem; and Nasal Congestion   History of present illness: Paul Roach is a 71 y.o. male seen today in consultation requested by [name], MD.  He complains of frequent nasal congestion, maxillary sinus pressure, postnasal drainage, hoarseness, and throat clearing "constantly", as well as occasional rhinorrhea and sneezing.  In the past, these symptoms had been predominantly confined to the fall, however over this past year the symptoms have been rather persistent with no significant seasonal symptom variation has been noted nor have specific environmental triggers been identified.  He states that on average he has 4 or more sinus infections per year requiring antibiotics or steroids.  He had nasal polypectomy approximately 2 years ago which provided relief for a few months prior to symptoms recurring.  A recent sinus CT revealed polyp regrowth despite compliance with fluticasone nasal spray daily. He currently has significantly diminished sense of smell and taste.  He reports that he was diagnosed with asthma last year.  His symptoms consist primarily of coughing and wheezing which tend to be worse at nighttime and exacerbated by sinus infections. He has noted significant symptom reduction while taking Advair 100-50 one inhalation twice daily, and montelukast 10 mg daily at bedtime, however he still wheezes at nighttime. When asked if aspirin exacerbates his asthma he states that he does not recall having taken aspirin in the past and takes naproxen without problems as needed.  He has no pets currently and has no bird, mold, or hot tub exposure. He denies symptoms related to acid reflux.   Assessment and plan: Nasal polyposis  Start budesonide/saline nasal irrigation  twice a day.  A prescription has been provided for budesonide 0.5 mg respules and instructions for mixing and adminstering the rinse have been discussed and provided in written form.  To jumpstart symptom relief, prednisone has been provided, 40 mg x3 days, 20 mg x1 day, 10 mg x1 day, then stop.  Perennial allergic rhinitis with nonallergic component  Aeroallergen avoidance measures have been discussed and provided in written form.  Budesonide/saline irrigation twice a day (as above).  For thick post nasal drainage, add guaifenesin 1200 mg (Mucinex Maximum Strength)  twice daily as needed with adequate hydration as discussed.  Moderate persistent asthma Today's spirometry results, assessed while asymptomatic, suggest under-perception of bronchoconstriction.  A prescription has been provided for Symbicort (budesonide/formoterol) 160/4.5 g, 2 inhalations twice a day. To maximize pulmonary deposition, a spacer has been provided along with instructions for its proper administration with an HFA inhaler.  For now, continue montelukast 10 mg daily at bedtime and albuterol HFA, 1-2 inhalations every 4-6 hours as needed.  Subjective and objective measures of pulmonary function will be followed and the treatment plan will be adjusted accordingly.  As he has nasal polyposis and persistent asthma, an observed aspirin challenge may be prudent should he be asked to take aspirin in the future.  Essential hypertension  Decongestants, including pseudoephedrine and phenylephrine will be avoided in this patient.   Meds ordered this encounter  Medications  . budesonide-formoterol (SYMBICORT) 160-4.5 MCG/ACT inhaler    Sig: Inhale 2 puffs into the lungs 2 (two) times daily.    Dispense:  1 Inhaler    Refill:  5  . budesonide (PULMICORT) 0.5 MG/2ML nebulizer solution    Sig: Take  2 mLs (0.5 mg total) by nebulization 2 (two) times daily.    Dispense:  2 mL    Refill:  5    Diagnostics: Spirometry:  FVC is 3.12 L and FEV1 is 2.16 L (68% predicted) with significant (430 mL, 20%) postbronchodilator improvement.  Mild airways obstruction with significant reversibility.This study was performed while the patient was asymptomatic.  Please see scanned spirometry results for details. Epicutaneous testing:  Negative despite a positive histamine control. Intradermal testing: Borderline positive to molds, Aspergillus and Penicillium.    Physical examination: Blood pressure (!) 150/90, pulse 61, temperature 97.8 F (36.6 C), temperature source Oral, resp. rate 16, height 6\' 1"  (1.854 m), weight 236 lb 12.8 oz (107.4 kg), SpO2 97 %.  General: Alert, interactive, in no acute distress. HEENT: TMs pearly gray, turbinates moderately edematous without discharge, post-pharynx mildly erythematous. Neck: Supple without lymphadenopathy. Lungs: Mildly decreased breath sounds bilaterally without wheezing, rhonchi or rales. CV: Normal S1, S2 without murmurs. Abdomen: Nondistended, nontender. Skin: Warm and dry, without lesions or rashes. Extremities:  No clubbing, cyanosis or edema. Neuro:   Grossly intact.  Review of systems:  Review of systems negative except as noted in HPI / PMHx or noted below: Review of Systems  Constitutional: Negative.   HENT: Negative.   Eyes: Negative.   Respiratory: Negative.   Cardiovascular: Negative.   Gastrointestinal: Negative.   Genitourinary: Negative.   Musculoskeletal: Negative.   Skin: Negative.   Neurological: Negative.   Endo/Heme/Allergies: Negative.   Psychiatric/Behavioral: Negative.     Past medical history:  Past Medical History:  Diagnosis Date  . Allergic rhinitis   . Asthma   . Cancer of kidney ([place])   . History of kidney cancer   . Hypertension     Past surgical history:  Past Surgical History:  Procedure Laterality Date  . HEMORRHOID SURGERY    . right ankle surgery    . right hip replacement    . right kidney removed  2000   for  renal ca  . SINOSCOPY      Family history: Family History  Problem Relation Age of Onset  . Hypertension Mother   . Diabetes Mother   . Colon cancer Sister   . Lung disease Neg Hx   . Allergic rhinitis Neg Hx   . Angioedema Neg Hx   . Asthma Neg Hx   . Eczema Neg Hx   . Immunodeficiency Neg Hx   . Urticaria Neg Hx     Social history: Social History   Social History  . Marital status: Widowed    Spouse name: N/A  . Number of children: 3  . Years of education: N/A   Occupational History  . Retired     Patent attorney   Social History Main Topics  . Smoking status: Never Smoker  . Smokeless tobacco: Never Used  . Alcohol use 0.0 oz/week     Comment: 1-2 drinks/month  . Drug use: No  . Sexual activity: Not on file   Other Topics Concern  . Not on file   Social History Narrative   Originally from [place]. Moved to [place] in 1996. Has also lived in [place] state. Has traveled to [place], [place], [place], [place], [place], [place], [place], [place], [place], [place], [place], [place], [place], [place], [place], [place], [place], [place], & [place]. 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 No pets currently. No bird, mold or  hot tub exposure. Enjoys exercising regularly, fishing & traveling.    Environmental History: The patient lives in an 73 year old townhouse with carpeting throughout and central air/heat.  There is no known mold or water damage in the home.  He is a nonsmoker without pets.  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  Allergies as of 12/16/2016   No Known Allergies     Medication List       Accurate as of 12/16/16 12:57 PM. Always use your most recent med list.          budesonide 0.5 MG/2ML nebulizer solution Commonly known as:  PULMICORT Take 2 mLs (0.5 mg total) by nebulization 2 (two) times daily.   budesonide-formoterol 160-4.5 MCG/ACT inhaler Commonly known as:   SYMBICORT Inhale 2 puffs into the lungs 2 (two) times daily.   chlorthalidone 25 MG tablet Commonly known as:  HYGROTON Take 25 mg by mouth daily.   fluticasone 50 MCG/ACT nasal spray Commonly known as:  FLONASE Place 1 spray into both nostrils 2 (two) times daily.   Fluticasone-Salmeterol 100-50 MCG/DOSE Aepb Commonly known as:  ADVAIR Inhale 1 puff into the lungs 2 (two) times daily.   hydrALAZINE 50 MG tablet Commonly known as:  APRESOLINE Take 1 tablet by mouth daily.   hydrochlorothiazide 12.5 MG tablet Commonly known as:  HYDRODIURIL Take 12.5 mg by mouth. 3 days a week   labetalol 200 MG tablet Commonly known as:  NORMODYNE Take 200 mg by mouth 2 (two) times daily.   montelukast 10 MG tablet Commonly known as:  SINGULAIR 1 tablet once daily   multivitamin tablet Take 1 tablet by mouth daily.   PROAIR RESPICLICK 655 (90 Base) MCG/ACT Aepb Generic drug:  Albuterol Sulfate 2 puffs 2-3 times daily       Known medication allergies: No Known Allergies  I appreciate the opportunity to take part in Paul Roach's care. Please do not hesitate to contact me with questions.  Sincerely,   R. Edgar Frisk, MD

## 2016-12-16 NOTE — Assessment & Plan Note (Addendum)
   Start budesonide/saline nasal irrigation twice a day.  A prescription has been provided for budesonide 0.5 mg respules and instructions for mixing and adminstering the rinse have been discussed and provided in written form.  To jumpstart symptom relief, prednisone has been provided, 40 mg x3 days, 20 mg x1 day, 10 mg x1 day, then stop.

## 2016-12-16 NOTE — Assessment & Plan Note (Signed)
Today's spirometry results, assessed while asymptomatic, suggest under-perception of bronchoconstriction.  A prescription has been provided for Symbicort (budesonide/formoterol) 160/4.5 g, 2 inhalations twice a day. To maximize pulmonary deposition, a spacer has been provided along with instructions for its proper administration with an HFA inhaler.  For now, continue montelukast 10 mg daily at bedtime and albuterol HFA, 1-2 inhalations every 4-6 hours as needed.  Subjective and objective measures of pulmonary function will be followed and the treatment plan will be adjusted accordingly.  As he has nasal polyposis and persistent asthma, an observed aspirin challenge may be prudent should he be asked to take aspirin in the future.

## 2017-02-01 ENCOUNTER — Ambulatory Visit (INDEPENDENT_AMBULATORY_CARE_PROVIDER_SITE_OTHER): Payer: Medicare Other | Admitting: Allergy

## 2017-02-01 ENCOUNTER — Encounter: Payer: Self-pay | Admitting: Allergy

## 2017-02-01 VITALS — BP 168/92 | HR 68 | Temp 98.4°F | Resp 16 | Ht 73.0 in

## 2017-02-01 DIAGNOSIS — J4540 Moderate persistent asthma, uncomplicated: Secondary | ICD-10-CM | POA: Diagnosis not present

## 2017-02-01 DIAGNOSIS — I10 Essential (primary) hypertension: Secondary | ICD-10-CM | POA: Diagnosis not present

## 2017-02-01 DIAGNOSIS — J3089 Other allergic rhinitis: Secondary | ICD-10-CM

## 2017-02-01 DIAGNOSIS — J320 Chronic maxillary sinusitis: Secondary | ICD-10-CM

## 2017-02-01 DIAGNOSIS — J339 Nasal polyp, unspecified: Secondary | ICD-10-CM | POA: Diagnosis not present

## 2017-02-01 MED ORDER — AMOXICILLIN 500 MG PO TABS
500.0000 mg | ORAL_TABLET | Freq: Two times a day (BID) | ORAL | 0 refills | Status: AC
Start: 2017-02-01 — End: 2017-02-08

## 2017-02-01 NOTE — Patient Instructions (Addendum)
  Sinusitis  Will treat for likely sinusitis with Amoxicillin 500mg  twice a day x 7 days.    Continue nasal rinse as below  Advise use of Mucinex DM (not D) or plain Mucinex as below to help thin/loosen secretions.    Nasal polyposis  Continue budesonide/saline nasal irrigation twice a day.  A prescription has been provided for budesonide 0.5 mg respules and instructions for mixing and adminstering the rinse have been discussed and provided in written form.  Perennial allergic rhinitis with nonallergic component  Budesonide/saline irrigation twice a day (as above).  For thick post nasal drainage, add guaifenesin 1200 mg (Mucinex Maximum Strength)  twice daily as needed with adequate hydration as discussed.  Moderate persistent asthma Spirometry today was decreased however does improve slightly with use of albuterol  continue Symbicort (budesonide/formoterol) 160/4.5 g, 2 inhalations twice a day. To maximize pulmonary deposition, a spacer has been provided along with instructions for its proper administration with an HFA inhaler.  Continue montelukast 10 mg daily at bedtime  Use albuterol HFA, 2 inhalations every 4-6 hours as needed (not to be taken daily unless you are symptomatic and need to take it daily).  Essential hypertension  Decongestants, including pseudoephedrine and phenylephrine will be avoided in this patient.   Return in about 3-4 months  or if symptoms worsen or fail to improve.

## 2017-02-01 NOTE — Progress Notes (Signed)
Follow-up Note  RE: Paul Roach MRN: [id number] DOB: [birth year] Date of Office Visit: 02/01/2017   History of present illness: Paul Roach is a 71 y.o. male presenting today for sick visit.  He last saw Dr. Verlin Fester on 12/16/16 for allergic rhinitis, nasal polyposis, and mod persistent asthma for initial visit.  He presents today for coughing with green mucus production as well as watery eyes.  The cough has been present for past couple days.  No wheezing or difficulty breathing with the cough.  He states he is not blowing much out but does feel like that is drainage in this throat.  He does take Symbicort 2 puffs twice a day.  He also reports he takes albuterol 2 puffs twice a day scheduled.  He does not take singulair everyday.   He denies any fevers.  He is unsure if mucus coming from his nasal congestion or his lungs.  He does state he still has a sense of smell which is better than his last visit.  He was given a steroid course and started on budesonide nasal irrigation which he does twice a day after last visit to help treat his nasal polyps.       Review of systems: Review of Systems  Constitutional: Negative for chills, fever and malaise/fatigue.  HENT: Positive for congestion. Negative for ear discharge, ear pain, nosebleeds, sinus pain, sore throat and tinnitus.   Eyes: Negative for discharge and redness.  Respiratory: Positive for cough and sputum production. Negative for shortness of breath and wheezing.   Cardiovascular: Negative for chest pain.  Gastrointestinal: Negative for abdominal pain, heartburn, nausea and vomiting.  Musculoskeletal: Negative for joint pain and myalgias.  Skin: Negative for itching and rash.  Neurological: Negative for headaches.    All other systems negative unless noted above in HPI  Past medical/social/surgical/family history have been reviewed and are unchanged unless specifically indicated below.  No changes  Medication List: Allergies as  of 02/01/2017   No Known Allergies     Medication List       Accurate as of 02/01/17  4:48 PM. Always use your most recent med list.          budesonide 0.5 MG/2ML nebulizer solution Commonly known as:  PULMICORT Take 2 mLs (0.5 mg total) by nebulization 2 (two) times daily.   budesonide-formoterol 160-4.5 MCG/ACT inhaler Commonly known as:  SYMBICORT Inhale 2 puffs into the lungs 2 (two) times daily.   chlorthalidone 25 MG tablet Commonly known as:  HYGROTON Take 25 mg by mouth daily.   fluticasone 50 MCG/ACT nasal spray Commonly known as:  FLONASE Place 1 spray into both nostrils 2 (two) times daily.   Fluticasone-Salmeterol 100-50 MCG/DOSE Aepb Commonly known as:  ADVAIR Inhale 1 puff into the lungs 2 (two) times daily.   hydrALAZINE 50 MG tablet Commonly known as:  APRESOLINE Take 1 tablet by mouth daily.   hydrochlorothiazide 12.5 MG tablet Commonly known as:  HYDRODIURIL Take 12.5 mg by mouth. 3 days a week   labetalol 200 MG tablet Commonly known as:  NORMODYNE Take 200 mg by mouth 2 (two) times daily.   montelukast 10 MG tablet Commonly known as:  SINGULAIR 1 tablet once daily   multivitamin tablet Take 1 tablet by mouth daily.   PROAIR RESPICLICK 401 (90 Base) MCG/ACT Aepb Generic drug:  Albuterol Sulfate 2 puffs 2-3 times daily       Known medication allergies: No Known Allergies   Physical examination: Blood  pressure (!) 168/92, pulse 68, temperature 98.4 F (36.9 C), temperature source Oral, resp. rate 16, height 6\' 1"  (1.854 m).  General: Alert, interactive, in no acute distress. HEENT: TMs pearly gray, turbinates markedly edematous without discharge, post-pharynx non erythematous. Neck: Supple without lymphadenopathy. Lungs: Mildly decreased breath sounds bilaterally without wheezing, rhonchi or rales. {no increased work of breathing.  Following duoneb Slight improvement in aeration. CV: Normal S1, S2 without murmurs. Abdomen:  Nondistended, nontender. Skin: Warm and dry, without lesions or rashes. Extremities:  No clubbing, cyanosis or edema. Neuro:   Grossly intact.  Diagnositics/Labs: Spirometry: FEV1: 1.64L  47%, FVC: 2.41L  50% duoneb given with significant BD response.  FEV1 increased to 2.09L  Assessment and plan:    Sinusitis, acute  Given his symptoms that is most likely that his mucus is coming from upper respiratory and not from his lower respiratory tract leading to cough as he does not have any other symptoms of an asthma flare. He is not requiring any increased need for his albuterol although he has been doing it scheduled.  Will treat for likely sinusitis with Amoxicillin 500mg  twice a day x 7 days.   Continue nasal rinse as below  Advise use of Mucinex DM (not D) or plain Mucinex as below to help thin/loosen secretions.    Nasal polyposis  Continue budesonide/saline nasal irrigation twice a day.    At this time he still has some sense of smell.  He will let us know if his smell goes away completely and he may require another burst of oral steroids.  Perennial allergic rhinitis with nonallergic component  Budesonide/saline irrigation twice a day (as above).  For thick post nasal drainage, add guaifenesin 1200 mg (Mucinex Maximum Strength)  twice daily as needed with adequate hydration as discussed.  Moderate persistent asthma  Spirometry today was decreased however does improve slightly with use of albuterol.  continue Symbicort (budesonide/formoterol) 160/4.5 g, 2 inhalations twice a day. To maximize pulmonary deposition, a spacer has been provided along with instructions for its proper administration with an HFA inhaler.  Continue montelukast 10 mg daily at bedtime. advised this is a daily medication help with control   Use albuterol HFA, 2 inhalations every 4-6 hours as needed (not to be taken daily unless you are symptomatic and need to take it daily).  Essential  hypertension  Decongestants, including pseudoephedrine and phenylephrine will be avoided in this patient.   He will let us know if he is not feeling better by the end of this week then at that time consider adding an oral steroids.  Return in about 3-4 months  or if symptoms worsen or fail to improve.   I appreciate the opportunity to take part in Paul Roach's care. Please do not hesitate to contact me with questions.  Sincerely,   Prudy Feeler, MD Allergy/Immunology [facility] [place] [facility] [place]

## 2017-03-11 ENCOUNTER — Ambulatory Visit: Payer: Medicare Other | Admitting: Pulmonary Disease

## 2017-04-01 DIAGNOSIS — M654 Radial styloid tenosynovitis [de Quervain]: Secondary | ICD-10-CM | POA: Insufficient documentation

## 2017-04-01 DIAGNOSIS — M25532 Pain in left wrist: Secondary | ICD-10-CM | POA: Insufficient documentation

## 2017-05-25 DIAGNOSIS — J301 Allergic rhinitis due to pollen: Secondary | ICD-10-CM | POA: Insufficient documentation

## 2017-05-25 DIAGNOSIS — J342 Deviated nasal septum: Secondary | ICD-10-CM | POA: Insufficient documentation

## 2017-08-17 ENCOUNTER — Other Ambulatory Visit: Payer: Self-pay | Admitting: Pulmonary Disease

## 2017-09-09 ENCOUNTER — Other Ambulatory Visit: Payer: Self-pay | Admitting: Allergy and Immunology

## 2017-09-09 DIAGNOSIS — J3089 Other allergic rhinitis: Secondary | ICD-10-CM

## 2017-09-09 DIAGNOSIS — J4541 Moderate persistent asthma with (acute) exacerbation: Secondary | ICD-10-CM

## 2017-09-09 DIAGNOSIS — J339 Nasal polyp, unspecified: Secondary | ICD-10-CM

## 2017-09-10 ENCOUNTER — Other Ambulatory Visit: Payer: Self-pay | Admitting: *Deleted

## 2017-09-10 DIAGNOSIS — J3089 Other allergic rhinitis: Secondary | ICD-10-CM

## 2017-09-10 DIAGNOSIS — J339 Nasal polyp, unspecified: Secondary | ICD-10-CM

## 2017-09-10 DIAGNOSIS — J4541 Moderate persistent asthma with (acute) exacerbation: Secondary | ICD-10-CM

## 2017-09-10 MED ORDER — BUDESONIDE 0.5 MG/2ML IN SUSP: RESPIRATORY_TRACT | 0 refills | Status: DC

## 2017-10-25 DIAGNOSIS — B351 Tinea unguium: Secondary | ICD-10-CM | POA: Diagnosis not present

## 2017-10-25 DIAGNOSIS — Z1389 Encounter for screening for other disorder: Secondary | ICD-10-CM | POA: Diagnosis not present

## 2017-10-25 DIAGNOSIS — E7800 Pure hypercholesterolemia, unspecified: Secondary | ICD-10-CM | POA: Diagnosis not present

## 2017-10-25 DIAGNOSIS — Z125 Encounter for screening for malignant neoplasm of prostate: Secondary | ICD-10-CM | POA: Diagnosis not present

## 2017-10-25 DIAGNOSIS — Z0000 Encounter for general adult medical examination without abnormal findings: Secondary | ICD-10-CM | POA: Diagnosis not present

## 2017-11-09 DIAGNOSIS — N183 Chronic kidney disease, stage 3 (moderate): Secondary | ICD-10-CM | POA: Diagnosis not present

## 2017-11-09 DIAGNOSIS — N390 Urinary tract infection, site not specified: Secondary | ICD-10-CM | POA: Diagnosis not present

## 2017-11-09 DIAGNOSIS — I10 Essential (primary) hypertension: Secondary | ICD-10-CM | POA: Diagnosis not present

## 2017-11-24 ENCOUNTER — Encounter: Payer: Self-pay | Admitting: Allergy and Immunology

## 2017-11-24 ENCOUNTER — Ambulatory Visit (INDEPENDENT_AMBULATORY_CARE_PROVIDER_SITE_OTHER): Payer: Medicare HMO | Admitting: Allergy and Immunology

## 2017-11-24 VITALS — BP 146/66 | HR 68 | Temp 97.9°F | Resp 12

## 2017-11-24 DIAGNOSIS — J339 Nasal polyp, unspecified: Secondary | ICD-10-CM | POA: Diagnosis not present

## 2017-11-24 DIAGNOSIS — J4541 Moderate persistent asthma with (acute) exacerbation: Secondary | ICD-10-CM | POA: Diagnosis not present

## 2017-11-24 DIAGNOSIS — J3089 Other allergic rhinitis: Secondary | ICD-10-CM

## 2017-11-24 MED ORDER — PROAIR RESPICLICK 108 (90 BASE) MCG/ACT IN AEPB: 90.0000 ug | INHALATION_SPRAY | RESPIRATORY_TRACT | 0 refills | Status: DC

## 2017-11-24 MED ORDER — FLUTICASONE PROPIONATE 50 MCG/ACT NA SUSP: NASAL | 3 refills | Status: DC

## 2017-11-24 MED ORDER — FLUTICASONE-SALMETEROL 100-50 MCG/DOSE IN AEPB: INHALATION_SPRAY | RESPIRATORY_TRACT | 2 refills | Status: DC

## 2017-11-24 NOTE — Progress Notes (Signed)
Follow-up Note  RE: Paul Roach MRN: [id number] DOB: [birth year] Date of Office Visit: 11/24/2017  Primary care provider: [name], MD Referring provider: [name], MD  History of present illness: Paul Roach is a 72 y.o. male with persistent asthma, allergic rhinitis, and nasal polyposis presenting today for follow-up.  He was last seen in this clinic in April 2018.  He reports that his asthma has been well controlled while taking Advair 100-50 g, 1 inhalation twice daily.  He rarely requires albuterol rescue and denies nocturnal awakenings due to lower respiratory symptoms.  This past week he experienced nasal congestion, rhinorrhea, and coughing, however the symptoms have resolved.  Other than this recent episode his nasal/sinus symptoms have been well controlled.  He reports that while taking budesonide/saline rinses twice daily he has recovered his sense of smell.   Assessment and plan: Moderate persistent asthma Currently well controlled.  Continue Advair Diskus 100-50 g, 1 inhalation twice daily, and ProAir Respiclick, 1-2 inhalations every 6 hours if needed.  Subjective and objective measures of pulmonary function will be followed and the treatment plan will be adjusted accordingly.  Nasal polyposis Anosmia has improved.  Continue nasal budesonide/saline lavage twice daily.  I have advised the patient that he may try going to once daily dosing, however he should have a low threshold to resume twice daily dosing should symptoms recur.  Perennial allergic rhinitis with nonallergic component  Continue appropriate aeroallergen avoidance measures and budesonide/saline irrigation (as above).  For thick post nasal drainage, add guaifenesin 1200 mg (Mucinex Maximum Strength)  twice daily as needed with adequate hydration.   Meds ordered this encounter  Medications  . Fluticasone-Salmeterol (ADVAIR DISKUS) 100-50 MCG/DOSE AEPB    Sig: One puff twice  daily to prevent coughing or wheezing    Dispense:  180 each    Refill:  2    Please consider 90 day supplies to promote better adherence  . fluticasone (FLONASE) 50 MCG/ACT nasal spray    Sig: 1 spray per nostril twice daily if needed for stuffy nose.    Dispense:  48 g    Refill:  3    Dispense 90 day supply.  Marland Kitchen PROAIR RESPICLICK 017 (90 Base) MCG/ACT AEPB    Sig: Inhale 90 mcg into the lungs every 4 (four) hours.    Dispense:  3 each    Refill:  0    Dispense 90 day    Diagnostics: Spirometry reveals an FVC of 3.23 L and an FEV1 of 2.19 L, FEV1 ratio of 89%.  Today's spirometry is improved compared with previous study.  Please see scanned spirometry results for details.    Physical examination: Blood pressure (!) 146/66, pulse 68, temperature 97.9 F (36.6 C), temperature source Oral, resp. rate 12, SpO2 95 %.  General: Alert, interactive, in no acute distress. HEENT: TMs pearly gray, turbinates mildly edematous without discharge, post-pharynx mildly erythematous. Neck: Supple without lymphadenopathy. Lungs: Clear to auscultation without wheezing, rhonchi or rales. CV: Normal S1, S2 without murmurs. Skin: Warm and dry, without lesions or rashes.  The following portions of the patient's history were reviewed and updated as appropriate: allergies, current medications, past family history, past medical history, past social history, past surgical history and problem list.  Allergies as of 11/24/2017   No Known Allergies     Medication List        Accurate as of 11/24/17  5:53 PM. Always use your most recent med list.  budesonide 0.5 MG/2ML nebulizer solution Commonly known as:  PULMICORT ADD 1 AMPULE TO 240ML OF SINUS RINSE AND IRRIGATE NASALLY TWICE DAILY   budesonide-formoterol 160-4.5 MCG/ACT inhaler Commonly known as:  SYMBICORT Inhale 2 puffs into the lungs 2 (two) times daily.   chlorthalidone 25 MG tablet Commonly known as:  HYGROTON Take 25 mg by  mouth daily.   fluticasone 50 MCG/ACT nasal spray Commonly known as:  FLONASE 1 spray per nostril twice daily if needed for stuffy nose.   Fluticasone-Salmeterol 100-50 MCG/DOSE Aepb Commonly known as:  ADVAIR DISKUS One puff twice daily to prevent coughing or wheezing   hydrALAZINE 50 MG tablet Commonly known as:  APRESOLINE Take 1 tablet by mouth daily.   hydrochlorothiazide 12.5 MG tablet Commonly known as:  HYDRODIURIL Take 12.5 mg by mouth. 3 days a week   labetalol 200 MG tablet Commonly known as:  NORMODYNE Take 200 mg by mouth 2 (two) times daily.   multivitamin tablet Take 1 tablet by mouth daily.   PROAIR RESPICLICK 800 (90 Base) MCG/ACT Aepb Generic drug:  Albuterol Sulfate Inhale 90 mcg into the lungs every 4 (four) hours.       No Known Allergies  I appreciate the opportunity to take part in Emma's care. Please do not hesitate to contact me with questions.  Sincerely,   R. Edgar Frisk, MD

## 2017-11-24 NOTE — Assessment & Plan Note (Signed)
Currently well controlled.  Continue Advair Diskus 100-50 g, 1 inhalation twice daily, and ProAir Respiclick, 1-2 inhalations every 6 hours if needed.  Subjective and objective measures of pulmonary function will be followed and the treatment plan will be adjusted accordingly.

## 2017-11-24 NOTE — Assessment & Plan Note (Signed)
Anosmia has improved.  Continue nasal budesonide/saline lavage twice daily.  I have advised the patient that he may try going to once daily dosing, however he should have a low threshold to resume twice daily dosing should symptoms recur.

## 2017-11-24 NOTE — Patient Instructions (Signed)
Moderate persistent asthma Currently well controlled.  Continue Advair Diskus 100-50 g, 1 inhalation twice daily, and ProAir Respiclick, 1-2 inhalations every 6 hours if needed.  Subjective and objective measures of pulmonary function will be followed and the treatment plan will be adjusted accordingly.  Nasal polyposis Anosmia has improved.  Continue nasal budesonide/saline lavage twice daily.  I have advised the patient that he may try going to once daily dosing, however he should have a low threshold to resume twice daily dosing should symptoms recur.  Perennial allergic rhinitis with nonallergic component  Continue appropriate aeroallergen avoidance measures and budesonide/saline irrigation (as above).  For thick post nasal drainage, add guaifenesin 1200 mg (Mucinex Maximum Strength)  twice daily as needed with adequate hydration.   Return in about 5 months (around 04/23/2018), or if symptoms worsen or fail to improve.

## 2017-11-24 NOTE — Assessment & Plan Note (Signed)
   Continue appropriate aeroallergen avoidance measures and budesonide/saline irrigation (as above).  For thick post nasal drainage, add guaifenesin 1200 mg (Mucinex Maximum Strength)  twice daily as needed with adequate hydration.

## 2017-11-26 ENCOUNTER — Other Ambulatory Visit: Payer: Self-pay | Admitting: Allergy and Immunology

## 2017-11-26 DIAGNOSIS — J339 Nasal polyp, unspecified: Secondary | ICD-10-CM

## 2017-11-26 DIAGNOSIS — J3089 Other allergic rhinitis: Secondary | ICD-10-CM

## 2017-11-26 DIAGNOSIS — J4541 Moderate persistent asthma with (acute) exacerbation: Secondary | ICD-10-CM

## 2017-11-29 DIAGNOSIS — R69 Illness, unspecified: Secondary | ICD-10-CM | POA: Diagnosis not present

## 2017-11-30 ENCOUNTER — Telehealth: Payer: Self-pay | Admitting: Allergy

## 2017-11-30 ENCOUNTER — Other Ambulatory Visit: Payer: Self-pay | Admitting: Allergy

## 2017-11-30 DIAGNOSIS — J339 Nasal polyp, unspecified: Secondary | ICD-10-CM

## 2017-11-30 DIAGNOSIS — J4541 Moderate persistent asthma with (acute) exacerbation: Secondary | ICD-10-CM

## 2017-11-30 DIAGNOSIS — J3089 Other allergic rhinitis: Secondary | ICD-10-CM

## 2017-11-30 MED ORDER — BUDESONIDE 0.5 MG/2ML IN SUSP
RESPIRATORY_TRACT | 1 refills | Status: DC
Start: 2017-11-30 — End: 2018-12-21

## 2017-11-30 NOTE — Telephone Encounter (Signed)
Patient called and wanted [place] to Cancell the Pulmocort nebulizer that we sent in at  [facility] at [address] and call it in to [facility] on [address]. Taken care of.

## 2017-12-02 DIAGNOSIS — R69 Illness, unspecified: Secondary | ICD-10-CM | POA: Diagnosis not present

## 2017-12-20 ENCOUNTER — Other Ambulatory Visit: Payer: Self-pay

## 2017-12-21 DIAGNOSIS — N183 Chronic kidney disease, stage 3 (moderate): Secondary | ICD-10-CM | POA: Diagnosis not present

## 2017-12-21 DIAGNOSIS — I10 Essential (primary) hypertension: Secondary | ICD-10-CM | POA: Diagnosis not present

## 2017-12-28 DIAGNOSIS — N390 Urinary tract infection, site not specified: Secondary | ICD-10-CM | POA: Diagnosis not present

## 2017-12-28 DIAGNOSIS — N183 Chronic kidney disease, stage 3 (moderate): Secondary | ICD-10-CM | POA: Diagnosis not present

## 2017-12-28 DIAGNOSIS — I10 Essential (primary) hypertension: Secondary | ICD-10-CM | POA: Diagnosis not present

## 2018-01-18 DIAGNOSIS — H524 Presbyopia: Secondary | ICD-10-CM | POA: Diagnosis not present

## 2018-01-18 DIAGNOSIS — H2513 Age-related nuclear cataract, bilateral: Secondary | ICD-10-CM | POA: Diagnosis not present

## 2018-01-18 DIAGNOSIS — H5203 Hypermetropia, bilateral: Secondary | ICD-10-CM | POA: Diagnosis not present

## 2018-01-18 DIAGNOSIS — H52223 Regular astigmatism, bilateral: Secondary | ICD-10-CM | POA: Diagnosis not present

## 2018-02-15 DIAGNOSIS — R5383 Other fatigue: Secondary | ICD-10-CM | POA: Diagnosis not present

## 2018-02-15 DIAGNOSIS — J309 Allergic rhinitis, unspecified: Secondary | ICD-10-CM | POA: Diagnosis not present

## 2018-02-22 DIAGNOSIS — R69 Illness, unspecified: Secondary | ICD-10-CM | POA: Diagnosis not present

## 2018-03-23 DIAGNOSIS — N183 Chronic kidney disease, stage 3 (moderate): Secondary | ICD-10-CM | POA: Diagnosis not present

## 2018-03-23 DIAGNOSIS — I10 Essential (primary) hypertension: Secondary | ICD-10-CM | POA: Diagnosis not present

## 2018-03-25 DIAGNOSIS — N183 Chronic kidney disease, stage 3 (moderate): Secondary | ICD-10-CM | POA: Diagnosis not present

## 2018-03-25 DIAGNOSIS — I10 Essential (primary) hypertension: Secondary | ICD-10-CM | POA: Diagnosis not present

## 2018-03-25 DIAGNOSIS — N390 Urinary tract infection, site not specified: Secondary | ICD-10-CM | POA: Diagnosis not present

## 2018-05-26 DIAGNOSIS — R69 Illness, unspecified: Secondary | ICD-10-CM | POA: Diagnosis not present

## 2018-06-14 DIAGNOSIS — H25043 Posterior subcapsular polar age-related cataract, bilateral: Secondary | ICD-10-CM | POA: Diagnosis not present

## 2018-06-14 DIAGNOSIS — H2511 Age-related nuclear cataract, right eye: Secondary | ICD-10-CM | POA: Diagnosis not present

## 2018-06-14 DIAGNOSIS — H2513 Age-related nuclear cataract, bilateral: Secondary | ICD-10-CM | POA: Diagnosis not present

## 2018-06-14 DIAGNOSIS — H02831 Dermatochalasis of right upper eyelid: Secondary | ICD-10-CM | POA: Diagnosis not present

## 2018-06-14 DIAGNOSIS — H18413 Arcus senilis, bilateral: Secondary | ICD-10-CM | POA: Diagnosis not present

## 2018-06-14 DIAGNOSIS — H25013 Cortical age-related cataract, bilateral: Secondary | ICD-10-CM | POA: Diagnosis not present

## 2018-07-01 DIAGNOSIS — Z961 Presence of intraocular lens: Secondary | ICD-10-CM | POA: Diagnosis not present

## 2018-07-01 DIAGNOSIS — H2512 Age-related nuclear cataract, left eye: Secondary | ICD-10-CM | POA: Diagnosis not present

## 2018-07-01 DIAGNOSIS — Z9841 Cataract extraction status, right eye: Secondary | ICD-10-CM | POA: Diagnosis not present

## 2018-07-01 DIAGNOSIS — H2511 Age-related nuclear cataract, right eye: Secondary | ICD-10-CM | POA: Diagnosis not present

## 2018-07-01 DIAGNOSIS — H52221 Regular astigmatism, right eye: Secondary | ICD-10-CM | POA: Diagnosis not present

## 2018-07-01 DIAGNOSIS — H5201 Hypermetropia, right eye: Secondary | ICD-10-CM | POA: Diagnosis not present

## 2018-07-11 DIAGNOSIS — H2512 Age-related nuclear cataract, left eye: Secondary | ICD-10-CM | POA: Diagnosis not present

## 2018-07-11 DIAGNOSIS — Z9849 Cataract extraction status, unspecified eye: Secondary | ICD-10-CM | POA: Diagnosis not present

## 2018-07-11 DIAGNOSIS — Z961 Presence of intraocular lens: Secondary | ICD-10-CM | POA: Diagnosis not present

## 2018-07-20 DIAGNOSIS — J45909 Unspecified asthma, uncomplicated: Secondary | ICD-10-CM | POA: Diagnosis not present

## 2018-07-20 DIAGNOSIS — Z23 Encounter for immunization: Secondary | ICD-10-CM | POA: Diagnosis not present

## 2018-08-24 DIAGNOSIS — R69 Illness, unspecified: Secondary | ICD-10-CM | POA: Diagnosis not present

## 2018-09-15 DIAGNOSIS — N183 Chronic kidney disease, stage 3 (moderate): Secondary | ICD-10-CM | POA: Diagnosis not present

## 2018-09-15 DIAGNOSIS — R0982 Postnasal drip: Secondary | ICD-10-CM | POA: Diagnosis not present

## 2018-09-15 DIAGNOSIS — R196 Halitosis: Secondary | ICD-10-CM | POA: Diagnosis not present

## 2018-09-15 DIAGNOSIS — R29890 Loss of height: Secondary | ICD-10-CM | POA: Diagnosis not present

## 2018-09-15 DIAGNOSIS — I10 Essential (primary) hypertension: Secondary | ICD-10-CM | POA: Diagnosis not present

## 2018-09-15 DIAGNOSIS — N390 Urinary tract infection, site not specified: Secondary | ICD-10-CM | POA: Diagnosis not present

## 2018-11-08 DIAGNOSIS — I10 Essential (primary) hypertension: Secondary | ICD-10-CM | POA: Diagnosis not present

## 2018-11-08 DIAGNOSIS — Z125 Encounter for screening for malignant neoplasm of prostate: Secondary | ICD-10-CM | POA: Diagnosis not present

## 2018-11-08 DIAGNOSIS — N183 Chronic kidney disease, stage 3 (moderate): Secondary | ICD-10-CM | POA: Diagnosis not present

## 2018-11-08 DIAGNOSIS — E7800 Pure hypercholesterolemia, unspecified: Secondary | ICD-10-CM | POA: Diagnosis not present

## 2018-11-15 DIAGNOSIS — Z0000 Encounter for general adult medical examination without abnormal findings: Secondary | ICD-10-CM | POA: Diagnosis not present

## 2018-11-15 DIAGNOSIS — J45998 Other asthma: Secondary | ICD-10-CM | POA: Diagnosis not present

## 2018-11-15 DIAGNOSIS — N529 Male erectile dysfunction, unspecified: Secondary | ICD-10-CM | POA: Diagnosis not present

## 2018-11-15 DIAGNOSIS — R05 Cough: Secondary | ICD-10-CM | POA: Diagnosis not present

## 2018-11-15 DIAGNOSIS — R21 Rash and other nonspecific skin eruption: Secondary | ICD-10-CM | POA: Diagnosis not present

## 2018-12-07 DIAGNOSIS — R972 Elevated prostate specific antigen [PSA]: Secondary | ICD-10-CM | POA: Diagnosis not present

## 2018-12-14 DIAGNOSIS — N390 Urinary tract infection, site not specified: Secondary | ICD-10-CM | POA: Diagnosis not present

## 2018-12-14 DIAGNOSIS — N183 Chronic kidney disease, stage 3 (moderate): Secondary | ICD-10-CM | POA: Diagnosis not present

## 2018-12-14 DIAGNOSIS — I10 Essential (primary) hypertension: Secondary | ICD-10-CM | POA: Diagnosis not present

## 2018-12-19 ENCOUNTER — Encounter: Payer: Self-pay | Admitting: Cardiology

## 2018-12-21 ENCOUNTER — Other Ambulatory Visit: Payer: Self-pay

## 2018-12-21 ENCOUNTER — Ambulatory Visit (INDEPENDENT_AMBULATORY_CARE_PROVIDER_SITE_OTHER): Payer: Medicare HMO

## 2018-12-21 ENCOUNTER — Encounter: Payer: Self-pay | Admitting: Cardiology

## 2018-12-21 ENCOUNTER — Ambulatory Visit: Payer: Medicare HMO | Admitting: Cardiology

## 2018-12-21 VITALS — BP 160/78 | HR 86 | Ht 73.0 in | Wt 230.4 lb

## 2018-12-21 DIAGNOSIS — I10 Essential (primary) hypertension: Secondary | ICD-10-CM

## 2018-12-21 DIAGNOSIS — R002 Palpitations: Secondary | ICD-10-CM

## 2018-12-21 DIAGNOSIS — I491 Atrial premature depolarization: Secondary | ICD-10-CM | POA: Insufficient documentation

## 2018-12-21 NOTE — Progress Notes (Addendum)
Cardiology Office Note    Date:  12/21/2018   ID:  Paul Roach, DOB [birth year], MRN [id number]  PCP:  Paul Cruel, MD  Cardiologist:  [name], MD   Chief Complaint  Patient presents with  . Palpitations    History of Present Illness:  Paul Roach is a 73 y.o. male who is being seen today for the evaluation of arrhythmias at the request of Paul Cruel, MD.  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  He recently was seen by his kidney MD for followup and was noted to have an irregular heart rhythm that they thought might be atrial fibrillation.  They did not have access to EKG and he was sent to Cardiology for further evaluation.  He tells me that he occasionally feels a skipping of his heart beat.  He denies any chest pain or pressure, SOB, DOE PND, orthopnea, LE edema, dizziness or syncope.  He is very active and up until this past week, worked out frequently at [employer] with no problems.    Past Medical History:  Diagnosis Date  . Essential hypertension 07/18/2009   Qualifier: Diagnosis of  By: [name], [name]    . Fatigue   . Hypertension   . KIDNEY CANCER 07/18/2009   s/p right neprectomy  . Moderate persistent asthma 07/18/2009       . Nasal polyposis 12/16/2016  . Onychomycosis 12/19/2014  . Pain in lower limb 12/19/2014  . Palpitations   . Perennial allergic rhinitis with nonallergic component 06/17/2016  . Sinusitis, maxillary, chronic 11/09/2012    Past Surgical History:  Procedure Laterality Date  . HEMORRHOID SURGERY    . right ankle surgery    . right hip replacement    . right kidney removed  2000   for renal ca  . SINOSCOPY      Current Medications: Current Meds  Medication Sig  . amLODipine (NORVASC) 5 MG tablet Take 5 mg by mouth daily.  Marland Kitchen aspirin EC 81 MG tablet Take 81 mg by mouth daily.  . fluticasone (FLONASE) 50 MCG/ACT nasal spray 1 spray per  nostril twice daily if needed for stuffy nose.  Marland Kitchen Fluticasone-Salmeterol (ADVAIR DISKUS) 100-50 MCG/DOSE AEPB One puff twice daily to prevent coughing or wheezing  . hydrALAZINE (APRESOLINE) 100 MG tablet Take 100 mg by mouth 3 (three) times daily.  . hydrochlorothiazide (HYDRODIURIL) 12.5 MG tablet Take 12.5 mg by mouth. 3 days a week  . Multiple Vitamin (MULTIVITAMIN) tablet Take 1 tablet by mouth daily.  Marland Kitchen PROAIR RESPICLICK 737 (90 Base) MCG/ACT AEPB Inhale 90 mcg into the lungs every 4 (four) hours.    Allergies:   Altace [ramipril] and Cozaar [losartan potassium]   Social History   Socioeconomic History  . Marital status: Widowed    Spouse name: Not on file  . Number of children: 3  . Years of education: Not on file  . Highest education level: Not on file  Occupational History  . Occupation: Retired    Comment: Patent attorney  Social Needs  . Financial resource strain: Not on file  . Food insecurity:    Worry: Not on file    Inability: Not on file  . Transportation needs:    Medical: Not on file    Non-medical: Not on file  Tobacco Use  . Smoking status: Never Smoker  . Smokeless tobacco: Never Used  Substance and Sexual Activity  . Alcohol use: Yes    Alcohol/week: 0.0 standard drinks    Comment: 1-2 drinks/month  . Drug use: No  . Sexual activity: Not on file  Lifestyle  . Physical activity:    Days per week: Not on file    Minutes per session: Not on file  . Stress: Not on file  Relationships  . Social connections:    Talks on phone: Not on file    Gets together: Not on file    Attends religious service: Not on file    Active member of club or organization: Not on file    Attends meetings of clubs or organizations: Not on file    Relationship status: Not on file  Other Topics Concern  . Not on file  Social History Narrative   Originally from [place]. Moved to [place] in 1996. Has also lived in [place]. Has traveled to [place], [place], [place], [place],  [place], [place], [place], [place], [place], [place], [place], [place], [place], [place], [place], [place], [place], [place], & [place]. 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 No pets currently. No bird, mold or hot tub exposure. Enjoys exercising regularly, fishing & traveling.      Family History:  The patient's family history includes Colon cancer in his sister; Diabetes in his mother; Hypertension in his mother.   ROS:   Please see the history of present illness.    ROS All other systems reviewed and are negative.  No flowsheet data found.     PHYSICAL EXAM:   VS:  BP (!) 160/78   Pulse 86   Ht 6\' 1"  (1.854 m)   Wt 230 lb 6.4 oz (104.5 kg)   SpO2 98%   BMI 30.40 kg/m    GEN: Well nourished, well developed, in no acute distress  HEENT: normal  Neck: no JVD, carotid bruits, or masses Cardiac: RRR; no murmurs, rubs, or gallops,no edema.  Intact distal pulses bilaterally.  Respiratory:  clear to auscultation bilaterally, normal work of breathing GI: soft, nontender, nondistended, + BS MS: no deformity or atrophy  Skin: warm and dry, no rashNSR Neuro:  Alert and Oriented x 3, Strength and sensation are intact Psych: euthymic mood, full affect  Wt Readings from Last 3 Encounters:  12/21/18 230 lb 6.4 oz (104.5 kg)  12/16/16 236 lb 12.8 oz (107.4 kg)  12/07/16 238 lb 6.4 oz (108.1 kg)      Studies/Labs Reviewed:   EKG:  EKG is ordered today.  The ekg ordered today demonstrates NSR with PACs and PVCs, LAFB, LVH with QRS widening and repolarization abnormality  Recent Labs: No results found for requested labs within last 8760 hours.   Lipid Panel No results found for: CHOL, TRIG, HDL, CHOLHDL, VLDL, LDLCALC, LDLDIRECT  Additional studies/ records that were reviewed today include:  Office notes from PCP    ASSESSMENT:    1. PAC (premature atrial contraction)   2. Essential hypertension   3.  Palpitations      PLAN:  In order of problems listed above:  1.  PACs with occasional palpitations - His EKG today showed PACs which likely created the irregularity in heart rate at recent OV with nephrology.  I will get an event monitor to assess for PAF.      2.  HTN - BP is controlled on exam today.  He will continue on Hydralazine 100mg   TID, HCTZ 12.45mg  daily, amlodipine 5mg  daily.   3.  Palpitations -these are not bothersome at all and likely related to PACs -event monitor pending    Medication Adjustments/Labs and Tests Ordered: Current medicines are reviewed at length with the patient today.  Concerns regarding medicines are outlined above.  Medication changes, Labs and Tests ordered today are listed in the Patient Instructions below.  Patient Instructions  Medication Instructions:  Your physician recommends that you continue on your current medications as directed. Please refer to the Current Medication list given to you today.  If you need a refill on your cardiac medications before your next appointment, please call your pharmacy.   Lab work: None If you have labs (blood work) drawn today and your tests are completely normal, you will receive your results only by: Marland Kitchen MyChart Message (if you have MyChart) OR . A paper copy in the mail If you have any lab test that is abnormal or we need to change your treatment, we will call you to review the results.  Testing/Procedures: Your physician has requested that you have an echocardiogram in 6 weeks. Echocardiography is a painless test that uses sound waves to create images of your heart. It provides your doctor with information about the size and shape of your heart and how well your heart's chambers and valves are working. This procedure takes approximately one hour. There are no restrictions for this procedure.  Your physician has recommended that you wear an event monitor. Event monitors are medical devices that record the  heart's electrical activity. Doctors most often [place] these monitors to diagnose arrhythmias. Arrhythmias are problems with the speed or rhythm of the heartbeat. The monitor is a small, portable device. You can wear one while you do your normal daily activities. This is usually used to diagnose what is causing palpitations/syncope (passing out).  Follow-Up: Your physician recommends that you schedule a follow-up appointment in: 8 weeks with the PA.       Signed, [name], MD  12/21/2018 8:08 PM    [facility] [address], [place], [place]  [address] Phone: [phone]; Fax: [phone]

## 2018-12-21 NOTE — Patient Instructions (Signed)
Medication Instructions:  Your physician recommends that you continue on your current medications as directed. Please refer to the Current Medication list given to you today.  If you need a refill on your cardiac medications before your next appointment, please call your pharmacy.   Lab work: None If you have labs (blood work) drawn today and your tests are completely normal, you will receive your results only by: Marland Kitchen MyChart Message (if you have MyChart) OR . A paper copy in the mail If you have any lab test that is abnormal or we need to change your treatment, we will call you to review the results.  Testing/Procedures: Your physician has requested that you have an echocardiogram in 6 weeks. Echocardiography is a painless test that uses sound waves to create images of your heart. It provides your doctor with information about the size and shape of your heart and how well your heart's chambers and valves are working. This procedure takes approximately one hour. There are no restrictions for this procedure.  Your physician has recommended that you wear an event monitor. Event monitors are medical devices that record the heart's electrical activity. Doctors most often [place] these monitors to diagnose arrhythmias. Arrhythmias are problems with the speed or rhythm of the heartbeat. The monitor is a small, portable device. You can wear one while you do your normal daily activities. This is usually used to diagnose what is causing palpitations/syncope (passing out).  Follow-Up: Your physician recommends that you schedule a follow-up appointment in: 8 weeks with the PA.

## 2018-12-29 ENCOUNTER — Telehealth: Payer: Self-pay | Admitting: *Deleted

## 2018-12-29 DIAGNOSIS — I472 Ventricular tachycardia: Secondary | ICD-10-CM

## 2018-12-29 DIAGNOSIS — R002 Palpitations: Secondary | ICD-10-CM

## 2018-12-29 DIAGNOSIS — I4729 Other ventricular tachycardia: Secondary | ICD-10-CM

## 2018-12-29 MED ORDER — METOPROLOL TARTRATE 25 MG PO TABS: 25.0000 mg | ORAL_TABLET | Freq: Two times a day (BID) | ORAL | 0 refills | Status: DC

## 2018-12-29 NOTE — Telephone Encounter (Signed)
Dr Cooper/DOD reviewed report of 11 beat run of NSVT and recommended metoprolol 25 mg bid and to recheck bmet in 1 month. Pt was informed and will start new medication. bmet was arranged to coincide with echo appointment 02/01/2019. Pt verbalized understanding.

## 2018-12-29 NOTE — Telephone Encounter (Signed)
Corene Cornea from [facility] is calling to report that this pt had a run of V-tach at a rate of 190 bpm, for 12 secs.  Per Corene Cornea, he returned back to sinus rhythm, thereafter.  Per Corene Cornea, there were no symptoms reported.  Corene Cornea will fax this report now to [phone].

## 2018-12-29 NOTE — Addendum Note (Signed)
Addended by: Jonathon Jordan on: 12/29/2018 04:59 PM   Modules accepted: Orders

## 2018-12-29 NOTE — Telephone Encounter (Signed)
Emergent report received, noted the pt was in sinus rhythm w/ IVCD, PACs, PVCs, and V-TACH 12 beats, rate 190 bpm, on 12/29/18 at 13:51 EDT.  Pt did not report symptoms, this was auto-triggered. Tried calling the pt at his home and he did not answer, so I advised him to call the office back asap, to discuss this monitor in more depth and inquire if he was symptomatic or not.

## 2018-12-29 NOTE — Telephone Encounter (Signed)
Patient needs Leane Call at time of echo at the end of April

## 2019-01-02 DIAGNOSIS — N183 Chronic kidney disease, stage 3 (moderate): Secondary | ICD-10-CM | POA: Diagnosis not present

## 2019-01-02 DIAGNOSIS — E213 Hyperparathyroidism, unspecified: Secondary | ICD-10-CM | POA: Diagnosis not present

## 2019-01-02 DIAGNOSIS — N390 Urinary tract infection, site not specified: Secondary | ICD-10-CM | POA: Diagnosis not present

## 2019-01-02 DIAGNOSIS — I10 Essential (primary) hypertension: Secondary | ICD-10-CM | POA: Diagnosis not present

## 2019-01-06 NOTE — Telephone Encounter (Signed)
Spoke with the patient, he accepted to having a Lexiscan stress test, and received instructions. He had no further questions.

## 2019-01-06 NOTE — Addendum Note (Signed)
Addended by: Sarina Ill on: 01/06/2019 02:39 PM   Modules accepted: Orders

## 2019-01-22 DIAGNOSIS — R002 Palpitations: Secondary | ICD-10-CM

## 2019-01-25 ENCOUNTER — Telehealth: Payer: Self-pay | Admitting: Cardiovascular Disease

## 2019-01-25 NOTE — Telephone Encounter (Signed)
   Primary Cardiologist:  No primary care provider on file.   Patient contacted.  History reviewed.    Talked to patient. He is feeling well.  Feels back to normal   I think it would be best to delay his echo currently shceduled for April 27. He knows to call for any worsening symptoms  Priority level 3   No symptoms to suggest any unstable cardiac conditions.  Based on discussion, with current pandemic situation, we will be postponing this appointment for Paul Roach with a plan for f/u in  12  wks or sooner if feasible/necessary.  If symptoms change, he has been instructed to contact our office.   Routing to C19 CANCEL pool for tracking (P CV DIV CV19 CANCEL - reason for visit "other.") and assigning priority (3 = >12 wks).   Mertie Moores, MD  01/25/2019 9:52 AM         .

## 2019-01-30 ENCOUNTER — Other Ambulatory Visit (HOSPITAL_COMMUNITY): Payer: Medicare HMO

## 2019-01-30 ENCOUNTER — Encounter (HOSPITAL_COMMUNITY): Payer: Medicare HMO

## 2019-01-30 ENCOUNTER — Other Ambulatory Visit: Payer: Medicare HMO

## 2019-01-31 ENCOUNTER — Encounter (HOSPITAL_COMMUNITY): Payer: Medicare HMO

## 2019-02-01 ENCOUNTER — Other Ambulatory Visit (HOSPITAL_COMMUNITY): Payer: Medicare HMO

## 2019-02-01 ENCOUNTER — Other Ambulatory Visit: Payer: Medicare HMO

## 2019-02-03 ENCOUNTER — Telehealth: Payer: Self-pay

## 2019-02-03 NOTE — Telephone Encounter (Signed)
Called pt to set up possible evisit, left message asking pt to call the office. If pt returns call please take a number and a time that is best to reach them. Thank you

## 2019-02-07 DIAGNOSIS — J309 Allergic rhinitis, unspecified: Secondary | ICD-10-CM | POA: Diagnosis not present

## 2019-02-07 DIAGNOSIS — J45909 Unspecified asthma, uncomplicated: Secondary | ICD-10-CM | POA: Diagnosis not present

## 2019-02-09 NOTE — Telephone Encounter (Signed)
Virtual Visit Pre-Appointment Phone Call  "(Name), I am calling you today to discuss your upcoming appointment. We are currently trying to limit exposure to the virus that causes COVID-19 by seeing patients at home rather than in the office."  1. "What is the BEST phone number to call the day of the visit?" - include this in appointment notes  2. "Do you have or have access to (through a family member/friend) a smartphone with video capability that we can use for your visit?" a. If yes - list this number in appt notes as "cell" (if different from BEST phone #) and list the appointment type as a VIDEO visit in appointment notes b. If no - list the appointment type as a PHONE visit in appointment notes  3. Confirm consent - "In the setting of the current Covid19 crisis, you are scheduled for a (phone or video) visit with your provider on (date) at (time).  Just as we do with many in-office visits, in order for you to participate in this visit, we must obtain consent.  If you'd like, I can send this to your mychart (if signed up) or email for you to review.  Otherwise, I can obtain your verbal consent now.  All virtual visits are billed to your insurance company just like a normal visit would be.  By agreeing to a virtual visit, we'd like you to understand that the technology does not allow for your provider to perform an examination, and thus may limit your provider's ability to fully assess your condition. If your provider identifies any concerns that need to be evaluated in person, we will make arrangements to do so.  Finally, though the technology is pretty good, we cannot assure that it will always work on either your or our end, and in the setting of a video visit, we may have to convert it to a phone-only visit.  In either situation, we cannot ensure that we have a secure connection.  Are you willing to proceed?" STAFF: Did the patient verbally acknowledge consent to telehealth visit? yes  4.  Advise patient to be prepared - "Two hours prior to your appointment, go ahead and check your blood pressure, pulse, oxygen saturation, and your weight (if you have the equipment to check those) and write them all down. When your visit starts, your provider will ask you for this information. If you have an Apple Watch or Kardia device, please plan to have heart rate information ready on the day of your appointment. Please have a pen and paper handy nearby the day of the visit as well."  5. Give patient instructions for MyChart download to smartphone OR Doximity/Doxy.me as below if video visit (depending on what platform provider is using)  6. Inform patient they will receive a phone call 15 minutes prior to their appointment time (may be from unknown caller ID) so they should be prepared to answer    TELEPHONE CALL NOTE  Paul Roach has been deemed a candidate for a follow-up tele-health visit to limit community exposure during the Covid-19 pandemic. I spoke with the patient via phone to ensure availability of phone/video source, confirm preferred email & phone number, and discuss instructions and expectations.  I reminded Paul Roach to be prepared with any vital sign and/or heart rhythm information that could potentially be obtained via home monitoring, at the time of his visit. I reminded Paul Roach to expect a phone call prior to his visit.  Ludger Nutting Dania Marsan,  CMA 02/09/2019 10:07 AM   INSTRUCTIONS FOR DOWNLOADING THE MYCHART APP TO SMARTPHONE  - The patient must first make sure to have activated MyChart and know their login information - If Apple, go to [employer] and type in MyChart in the search bar and download the app. If Android, ask patient to go to [employer] and type in [place] in the search bar and download the app. The app is free but as with any other app downloads, their phone may require them to verify saved payment information or Apple/Android password.  - The  patient will need to then log into the app with their MyChart username and password, and select [facility] as their healthcare provider to link the account. When it is time for your visit, go to the MyChart app, find appointments, and click Begin Video Visit. Be sure to Select Allow for your device to access the Microphone and Camera for your visit. You will then be connected, and your provider will be with you shortly.  **If they have any issues connecting, or need assistance please contact MyChart service desk [phone])**  **If using a computer, in order to ensure the best quality for their visit they will need to use either of the following Internet Browsers: [employer], or Google Chrome**  IF USING DOXIMITY or DOXY.ME - The patient will receive a link just prior to their visit by text.     FULL LENGTH CONSENT FOR TELE-HEALTH VISIT   I hereby voluntarily request, consent and authorize [facility] and its employed or contracted physicians, physician assistants, nurse practitioners or other licensed health care professionals (the Practitioner), to provide me with telemedicine health care services (the "Services") as deemed necessary by the treating Practitioner. I acknowledge and consent to receive the Services by the Practitioner via telemedicine. I understand that the telemedicine visit will involve communicating with the Practitioner through live audiovisual communication technology and the disclosure of certain medical information by electronic transmission. I acknowledge that I have been given the opportunity to request an in-person assessment or other available alternative prior to the telemedicine visit and am voluntarily participating in the telemedicine visit.  I understand that I have the right to withhold or withdraw my consent to the use of telemedicine in the course of my care at any time, without affecting my right to future care or treatment, and that the  Practitioner or I may terminate the telemedicine visit at any time. I understand that I have the right to inspect all information obtained and/or recorded in the course of the telemedicine visit and may receive copies of available information for a reasonable fee.  I understand that some of the potential risks of receiving the Services via telemedicine include:  Marland Kitchen Delay or interruption in medical evaluation due to technological equipment failure or disruption; . Information transmitted may not be sufficient (e.g. poor resolution of images) to allow for appropriate medical decision making by the Practitioner; and/or  . In rare instances, security protocols could fail, causing a breach of personal health information.  Furthermore, I acknowledge that it is my responsibility to provide information about my medical history, conditions and care that is complete and accurate to the best of my ability. I acknowledge that Practitioner's advice, recommendations, and/or decision may be based on factors not within their control, such as incomplete or inaccurate data provided by me or distortions of diagnostic images or specimens that may result from electronic transmissions. I understand that the practice of  medicine is not an Chief Strategy Officer and that Practitioner makes no warranties or guarantees regarding treatment outcomes. I acknowledge that I will receive a copy of this consent concurrently upon execution via email to the email address I last provided but may also request a printed copy by calling the office of [facility].    I understand that my insurance will be billed for this visit.   I have read or had this consent read to me. . I understand the contents of this consent, which adequately explains the benefits and risks of the Services being provided via telemedicine.  . I have been provided ample opportunity to ask questions regarding this consent and the Services and have had my questions answered to my  satisfaction. . I give my informed consent for the services to be provided through the use of telemedicine in my medical care  By participating in this telemedicine visit I agree to the above.

## 2019-02-15 ENCOUNTER — Telehealth: Payer: Medicare HMO | Admitting: Physician Assistant

## 2019-03-08 ENCOUNTER — Telehealth: Payer: Self-pay | Admitting: *Deleted

## 2019-03-08 ENCOUNTER — Telehealth (HOSPITAL_COMMUNITY): Payer: Self-pay | Admitting: *Deleted

## 2019-03-08 NOTE — Telephone Encounter (Signed)
      COVID-19 Pre-Screening Questions:  . In the past 7 to 10 days have you had a cough,  shortness of breath, headache, congestion, fever (100 or greater) body aches, chills, sore throat, or sudden loss of taste or sense of smell? . Have you been around anyone with known Covid 19. . Have you been around anyone who is awaiting Covid 19 test results in the past 7 to 10 days? . Have you been around anyone who has been exposed to Covid 19, or has mentioned symptoms of Covid 19 within the past 7 to 10 days?  If you have any concerns/questions about symptoms patients report during screening (either on the phone or at threshold). Contact the provider seeing the patient or DOD for further guidance.  If neither are available contact a member of the leadership team.    Contacted patient via phone call. No to all Covid 19 questionaire. Has a mask. KB

## 2019-03-08 NOTE — Telephone Encounter (Signed)
COVID-19 Pre-Screening Questions:  . Do you currently have a fever? No  (yes = cancel and refer to pcp for e-visit) . Have you recently travelled on a cruise, internationally, or to [place], [place], [place], [place], [place], or [place], [place] [employer]) ? NO  (yes = cancel, stay home, monitor symptoms, and contact pcp or initiate e-visit if symptoms develop) . Have you been in contact with someone that is currently pending confirmation of Covid19 testing or has been confirmed to have the [place] virus? NO  (yes = cancel, stay home, away from tested individual, monitor symptoms, and contact pcp or initiate e-visit if symptoms develop) . Are you currently experiencing fatigue or cough? NO (yes = pt should be prepared to have a mask placed at the time of their visit). . Reiterated no additional visitors. Eartha Inch no earlier than 15 minutes before appointment time. . Please bring own mask.

## 2019-03-09 ENCOUNTER — Encounter (HOSPITAL_COMMUNITY): Payer: Self-pay

## 2019-03-09 ENCOUNTER — Other Ambulatory Visit: Payer: Medicare HMO | Admitting: *Deleted

## 2019-03-09 ENCOUNTER — Other Ambulatory Visit: Payer: Self-pay

## 2019-03-09 ENCOUNTER — Ambulatory Visit (HOSPITAL_COMMUNITY): Payer: Medicare HMO | Attending: Cardiology

## 2019-03-09 DIAGNOSIS — R002 Palpitations: Secondary | ICD-10-CM

## 2019-03-09 DIAGNOSIS — I071 Rheumatic tricuspid insufficiency: Secondary | ICD-10-CM | POA: Insufficient documentation

## 2019-03-09 DIAGNOSIS — Z85528 Personal history of other malignant neoplasm of kidney: Secondary | ICD-10-CM | POA: Diagnosis not present

## 2019-03-09 DIAGNOSIS — I472 Ventricular tachycardia: Secondary | ICD-10-CM

## 2019-03-09 DIAGNOSIS — I129 Hypertensive chronic kidney disease with stage 1 through stage 4 chronic kidney disease, or unspecified chronic kidney disease: Secondary | ICD-10-CM | POA: Diagnosis not present

## 2019-03-09 DIAGNOSIS — N184 Chronic kidney disease, stage 4 (severe): Secondary | ICD-10-CM | POA: Insufficient documentation

## 2019-03-09 DIAGNOSIS — I4729 Other ventricular tachycardia: Secondary | ICD-10-CM

## 2019-03-09 LAB — BASIC METABOLIC PANEL
BUN/Creatinine Ratio: 9 — ABNORMAL LOW (ref 10–24)
BUN: 19 mg/dL (ref 8–27)
CO2: 25 mmol/L (ref 20–29)
Calcium: 9.4 mg/dL (ref 8.6–10.2)
Chloride: 97 mmol/L (ref 96–106)
Creatinine, Ser: 2.03 mg/dL — ABNORMAL HIGH (ref 0.76–1.27)
GFR calc Af Amer: 36 mL/min/{1.73_m2} — ABNORMAL LOW (ref >59)
GFR calc non Af Amer: 31 mL/min/{1.73_m2} — ABNORMAL LOW (ref >59)
Glucose: 177 mg/dL — ABNORMAL HIGH (ref 65–99)
Potassium: 4.2 mmol/L (ref 3.5–5.2)
Sodium: 137 mmol/L (ref 134–144)

## 2019-03-09 NOTE — Progress Notes (Signed)
[name] presented for echocardiogram this morning for palpitation referred by Dr. Radford Pax. Atrial flutter was noted which was new. Patient stated he has no symptoms and heart rate was 70-80bpm throughout the exam.   DOD (Dr. Lovena Le) was notified. Dr. Lovena Le talked to the patient and an 12-lead EKG was obtained which showed the atrial flutter. Dr. Lovena Le will notify Dr. Radford Pax of his finding. Patient was stable to be discharged.

## 2019-03-10 ENCOUNTER — Ambulatory Visit (HOSPITAL_COMMUNITY)
Admission: RE | Admit: 2019-03-10 | Discharge: 2019-03-10 | Disposition: A | Discharge status: Home / Self Care | Payer: Medicare HMO | Source: Ambulatory Visit | Attending: Cardiology | Admitting: Cardiology

## 2019-03-10 ENCOUNTER — Telehealth: Payer: Self-pay

## 2019-03-10 ENCOUNTER — Other Ambulatory Visit: Payer: Self-pay | Admitting: Cardiology

## 2019-03-10 DIAGNOSIS — R7989 Other specified abnormal findings of blood chemistry: Secondary | ICD-10-CM | POA: Insufficient documentation

## 2019-03-10 DIAGNOSIS — Z85528 Personal history of other malignant neoplasm of kidney: Secondary | ICD-10-CM

## 2019-03-10 DIAGNOSIS — I519 Heart disease, unspecified: Secondary | ICD-10-CM

## 2019-03-10 DIAGNOSIS — N281 Cyst of kidney, acquired: Secondary | ICD-10-CM | POA: Diagnosis not present

## 2019-03-10 DIAGNOSIS — Z905 Acquired absence of kidney: Secondary | ICD-10-CM

## 2019-03-10 DIAGNOSIS — C641 Malignant neoplasm of right kidney, except renal pelvis: Secondary | ICD-10-CM

## 2019-03-10 MED ORDER — APIXABAN 5 MG PO TABS: 5.0000 mg | ORAL_TABLET | Freq: Two times a day (BID) | ORAL | 3 refills | Status: DC

## 2019-03-10 MED ORDER — TECHNETIUM TO 99M ALBUMIN AGGREGATED
1.8000 | Freq: Once | INTRAVENOUS | Status: AC | PRN
  Administered 2019-03-10: 1.8 via INTRAVENOUS

## 2019-03-10 NOTE — Telephone Encounter (Signed)
Notes recorded by Sueanne Margarita, MD on 03/09/2019 at 8:13 PM EDT Please let patient know that echo showed low normal LVF with EF 50-55%with mildly thickened heart muscle, mildly enlarged RV with mildy RV systolic dysfunction, dilated RA and possible mass in IVC. Please see note regarding this earlier. He has a hx of renal CA need to consider renal cell CA - please see request to order Abdominal/Renal US as creatinine too high for CT with contrast. THis needs to be done tomorrow as well as VQ scan to rule out PE tomorrow (6/5)

## 2019-03-10 NOTE — Telephone Encounter (Signed)
-----   Message from Sueanne Margarita, MD sent at 03/09/2019  7:56 PM EDT ----- Thanks Carleene Overlie.  I will start him on Eliquis 5mg  BID (age < 26 and weight > 60kg, Creatinine 2).  Please get him in to see you on next DOD day.   Ben---- Please order Eliquis 5mg  BID and let patient know that I spoke with Dr. Lovena Le and he need to be on longterm anticoagulation due to cardioembolic risk.  Please have patient stop his ASA.  [name] ----- Message ----- From: [name], MD Sent: 03/09/2019   5:59 PM EDT To: Sueanne Margarita, MD  Traci, I was DOD today and was asked to look at Mr. Whaling who was getting an echo. His ECG shows new typical atrial flutter. I suspect he needs systemic anti-coag. I am glad to see him as DOD on 6/21. I mentioned ablation and anti-coagulation to him briefly but wanted to have you decide. Let me know if you would like for me to see and we will get him in to office on that day. I will leave up to you to decide on [place]. GT

## 2019-03-10 NOTE — Telephone Encounter (Signed)
Spoke with the patient, he accepted starting  Eliquis 5, twice a day. He is schedule for a V/Q scan and abdominal US today at [facility]. He is schedule for repeat BMET on 6/15 to evaluate his creatinine off HCTZ. He had no further questions.

## 2019-03-10 NOTE — Telephone Encounter (Signed)
**Note De-Identified Paul Roach Obfuscation** The pt is advised that we are leaving him 2 bottles of Eliquis samples and a free co-pay card in the downstairs lobby of the [facility] office on [address] in [place]. He is aware that I am going to attempt to get the cost of his Eliquis down and will call him with update when available.  He verbalized understanding and states that he will pick up his Eliquis samples and co-pay card early next week.

## 2019-03-10 NOTE — Addendum Note (Signed)
Addended by: Sarina Ill on: 03/10/2019 05:31 PM   Modules accepted: Orders

## 2019-03-10 NOTE — Telephone Encounter (Signed)
  Patient was told by pharmacy that the Eliquis would cost him out of pocket $141.00 and he was wanting to know if there is a way for him to get this any cheaper? He cannot fill it at that cost.

## 2019-03-13 ENCOUNTER — Telehealth: Payer: Self-pay | Admitting: Cardiology

## 2019-03-13 ENCOUNTER — Telehealth: Payer: Self-pay

## 2019-03-13 NOTE — Telephone Encounter (Signed)
The patient had questions on the reason for taking Eliquis. I advised him it was for atrial flutter. He expressed understanding.

## 2019-03-13 NOTE — Telephone Encounter (Signed)
**Note De-Identified Paul Roach Obfuscation** I called [employer] and s/w Caryl Pina concerning the cost of the pts Eliquis as he states that he went to pick up on Friday 6/5 and the cost was $141 for a 90 day supply. Caryl Pina and I attempted a tier exception on the pts Eliquis but was advised by a pharmacist there named Peter Congo that Eliquis is already at a tier 3 and connot be lowered any further as a brand named drug under Part D rules. We then attempted a Xarelto tier exception but was received the same outcome because Xarelto is a brande name drug already at a tier 3.  Caryl Pina and [place] both recommends that the pt take Coumadin.  I will forward this message to Dr Radford Pax and her nurse for advisement to the pt.

## 2019-03-13 NOTE — Telephone Encounter (Signed)
Please forward to [facility] to see what she can do

## 2019-03-13 NOTE — Telephone Encounter (Signed)
New Message     Pt c/o medication issue:  1. Name of Medication: Eliquis   2. How are you currently taking this medication (dosage and times per day)? Not sure   3. Are you having a reaction (difficulty breathing--STAT)? No   4. What is your medication issue? Pt is wondering if he is suppose to take this medication   Please call back

## 2019-03-15 ENCOUNTER — Other Ambulatory Visit (INDEPENDENT_AMBULATORY_CARE_PROVIDER_SITE_OTHER): Payer: Medicare HMO

## 2019-03-15 DIAGNOSIS — I4892 Unspecified atrial flutter: Secondary | ICD-10-CM | POA: Diagnosis not present

## 2019-03-15 NOTE — Telephone Encounter (Signed)
Called and spoke with patient. Explained why he is on a blood thinner (to prevent stroke from aflutter). Patient asked about the $10 coupon someone sent him- explained that that cannot be used with government insurance Passenger transport manager) He can try to apply to patient assistance through [employer], but that he will have to pay some $ out of pocket before he is approved. Explained the option of switching to coumadin (much cheaper) but requires more frequent visits (labs) especially in the beginning.  Explained if $141 was too much to pay all at once he could pay $47/month.  Patient states he wishes to stay with Eliquis and pay $47/month for now. Advised that if it became to expensive for patient to let us know before he runs out so we can transition him to warfarin.

## 2019-03-17 ENCOUNTER — Telehealth: Payer: Self-pay

## 2019-03-17 NOTE — Telephone Encounter (Signed)
    COVID-19 Pre-Screening Questions:  . In the past 7 to 10 days have you had a cough,  shortness of breath, headache, congestion, fever (100 or greater) body aches, chills, sore throat, or sudden loss of taste or sense of smell? . Have you been around anyone with known Covid 19. . Have you been around anyone who is awaiting Covid 19 test results in the past 7 to 10 days? . Have you been around anyone who has been exposed to Covid 19, or has mentioned symptoms of Covid 19 within the past 7 to 10 days?  If you have any concerns/questions about symptoms patients report during screening (either on the phone or at threshold). Contact the provider seeing the patient or DOD for further guidance.  If neither are available contact a member of the leadership team.          Pt answered NO to all pre-screening questions. [id number] 03/17/2019

## 2019-03-20 ENCOUNTER — Other Ambulatory Visit: Payer: Medicare HMO | Admitting: *Deleted

## 2019-03-20 ENCOUNTER — Other Ambulatory Visit: Payer: Self-pay

## 2019-03-20 ENCOUNTER — Telehealth: Payer: Self-pay | Admitting: Cardiology

## 2019-03-20 DIAGNOSIS — R7989 Other specified abnormal findings of blood chemistry: Secondary | ICD-10-CM | POA: Diagnosis not present

## 2019-03-20 LAB — BASIC METABOLIC PANEL
BUN/Creatinine Ratio: 10 (ref 10–24)
BUN: 19 mg/dL (ref 8–27)
CO2: 24 mmol/L (ref 20–29)
Calcium: 9.6 mg/dL (ref 8.6–10.2)
Chloride: 97 mmol/L (ref 96–106)
Creatinine, Ser: 1.84 mg/dL — ABNORMAL HIGH (ref 0.76–1.27)
GFR calc Af Amer: 41 mL/min/{1.73_m2} — ABNORMAL LOW (ref >59)
GFR calc non Af Amer: 35 mL/min/{1.73_m2} — ABNORMAL LOW (ref >59)
Glucose: 163 mg/dL — ABNORMAL HIGH (ref 65–99)
Potassium: 4.1 mmol/L (ref 3.5–5.2)
Sodium: 138 mmol/L (ref 134–144)

## 2019-03-20 NOTE — Telephone Encounter (Signed)
The patient states he has noticeable swelling since stopping HCTZ 03/10/2019. He reports it isn't "that bad," but noticeably progressing in the last week with R ankle slightly worse and L His shoes still fit. He has no shortness of breath.  Discussed salt avoidance. He states he has been limiting his salt intake for years and knows what foods to avoid.  Instructed him to elevate legs when sitting. His labs were drawn and his BMET is to be resulted soon. He understands he will be called with Dr. Theodosia Blender instructions.

## 2019-03-20 NOTE — Telephone Encounter (Signed)
Patient came in today for lab work.  He wanted to let the nurse know that since Dr. Radford Pax took him off of his diuretic, his ankles have been swelling.  Please call patient and let him know what to do.

## 2019-03-20 NOTE — Telephone Encounter (Signed)
I would like him to see his PCP regarding the worsening renal function and LE edema

## 2019-03-21 ENCOUNTER — Telehealth: Payer: Self-pay

## 2019-03-21 NOTE — Telephone Encounter (Signed)
The patient is calling his Nephrologist to discuss. Results sent to his office.

## 2019-03-21 NOTE — Telephone Encounter (Signed)

## 2019-03-28 ENCOUNTER — Encounter: Payer: Self-pay | Admitting: Internal Medicine

## 2019-03-28 ENCOUNTER — Other Ambulatory Visit: Payer: Self-pay

## 2019-03-28 ENCOUNTER — Ambulatory Visit: Payer: Medicare HMO | Admitting: Internal Medicine

## 2019-03-28 DIAGNOSIS — I4892 Unspecified atrial flutter: Secondary | ICD-10-CM | POA: Insufficient documentation

## 2019-03-28 NOTE — Progress Notes (Signed)
HPI [name] is referred today by Dr. Radford Roach for evaluation of atrial flutter. He is a pleasant 73 yo man with a h/o HTN who was found to be in atrial flutter when he underwent a 2D echo several weeks ago. He was placed on Eliquis. He has been stable. He denies palpitations. He does not have edema. His echo showed a low normal EF.  Allergies  Allergen Reactions  . Altace [Ramipril]   . Cozaar [Losartan Potassium]      Current Outpatient Medications  Medication Sig Dispense Refill  . amLODipine (NORVASC) 5 MG tablet Take 5 mg by mouth daily.    Marland Kitchen apixaban (ELIQUIS) 5 MG TABS tablet Take 1 tablet (5 mg total) by mouth 2 (two) times daily. 180 tablet 3  . fluticasone (FLONASE) 50 MCG/ACT nasal spray 1 spray per nostril twice daily if needed for stuffy nose. 48 g 3  . Fluticasone-Salmeterol (ADVAIR DISKUS) 100-50 MCG/DOSE AEPB One puff twice daily to prevent coughing or wheezing 180 each 2  . hydrALAZINE (APRESOLINE) 100 MG tablet Take 100 mg by mouth 3 (three) times daily.    . metoprolol tartrate (LOPRESSOR) 25 MG tablet Take 1 tablet (25 mg total) by mouth 2 (two) times daily. 180 tablet 0  . Multiple Vitamin (MULTIVITAMIN) tablet Take 1 tablet by mouth daily.    Marland Kitchen PROAIR RESPICLICK 865 (90 Base) MCG/ACT AEPB Inhale 90 mcg into the lungs every 4 (four) hours. 3 each 0   No current facility-administered medications for this visit.      Past Medical History:  Diagnosis Date  . Essential hypertension 07/18/2009   Qualifier: Diagnosis of  By: [name], [name]    . Fatigue   . Hypertension   . KIDNEY CANCER 07/18/2009   s/p right neprectomy  . Moderate persistent asthma 07/18/2009       . Nasal polyposis 12/16/2016  . Onychomycosis 12/19/2014  . Pain in lower limb 12/19/2014  . Palpitations   . Perennial allergic rhinitis with nonallergic component 06/17/2016  . Sinusitis, maxillary, chronic 11/09/2012    ROS:   All systems reviewed and negative except as noted in the  HPI.   Past Surgical History:  Procedure Laterality Date  . HEMORRHOID SURGERY    . right ankle surgery    . right hip replacement    . right kidney removed  2000   for renal ca  . SINOSCOPY       Family History  Problem Relation Age of Onset  . Hypertension Mother   . Diabetes Mother   . Colon cancer Sister   . Lung disease Neg Hx   . Allergic rhinitis Neg Hx   . Angioedema Neg Hx   . Asthma Neg Hx   . Eczema Neg Hx   . Immunodeficiency Neg Hx   . Urticaria Neg Hx      Social History   Socioeconomic History  . Marital status: Widowed    Spouse name: Not on file  . Number of children: 3  . Years of education: Not on file  . Highest education level: Not on file  Occupational History  . Occupation: Retired    Comment: Patent attorney  Social Needs  . Financial resource strain: Not on file  . Food insecurity    Worry: Not on file    Inability: Not on file  . Transportation needs    Medical: Not on file    Non-medical: Not on file  Tobacco Use  .  Smoking status: Never Smoker  . Smokeless tobacco: Never Used  Substance and Sexual Activity  . Alcohol use: Yes    Alcohol/week: 0.0 standard drinks    Comment: 1-2 drinks/month  . Drug use: No  . Sexual activity: Not on file  Lifestyle  . Physical activity    Days per week: Not on file    Minutes per session: Not on file  . Stress: Not on file  Relationships  . Social Herbalist on phone: Not on file    Gets together: Not on file    Attends religious service: Not on file    Active member of club or organization: Not on file    Attends meetings of clubs or organizations: Not on file    Relationship status: Not on file  . Intimate partner violence    Fear of current or ex partner: Not on file    Emotionally abused: Not on file    Physically abused: Not on file    Forced sexual activity: Not on file  Other Topics Concern  . Not on file  Social History Narrative   Originally from [place].  Moved to [place] in 1996. Has also lived in [place] state. Has traveled to [place], [place], [place], [place], [place], [place], [place], [place], [place], [place], [place], [place], [place], [place], [place], [place], [place], [place], & [place]. 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 No pets currently. No bird, mold or hot tub exposure. Enjoys exercising regularly, fishing & traveling.      BP (!) 146/88   Pulse 74   Ht 6\' 1"  (1.854 m)   Wt 230 lb (104.3 kg)   SpO2 98%   BMI 30.34 kg/m   Physical Exam:  Well appearing NAD HEENT: Unremarkable Neck:  No JVD, no thyromegally Lymphatics:  No adenopathy Back:  No CVA tenderness Lungs:  No increased work of breathing HEART:  IRegular rate rhythm Abd:  soft, positive bowel sounds, no organomegally, no rebound, no guarding Ext:  2 plus pulses, no edema, no cyanosis, no clubbing Skin:  No rashes no nodules Neuro:  CN II through XII intact, motor grossly intact  EKG - atrial flutter with a controlled VR. PVC's  Assess/Plan: 1. Atrial flutter - I have discussed the indications/risks/benefits/goals/expectations of EP study and catheter ablation and he will call us if he wishes to proceed. 2. HTN - his bp is up a bit. He may require med adjustment in the future.  [name].D.

## 2019-03-28 NOTE — Patient Instructions (Addendum)
Medication Instructions:  Your physician recommends that you continue on your current medications as directed. Please refer to the Current Medication list given to you today.  Labwork: None ordered.  Testing/Procedures: Your physician has recommended that you have an ablation. Catheter ablation is a medical procedure used to treat some cardiac arrhythmias (irregular heartbeats). During catheter ablation, a long, thin, flexible tube is put into a blood vessel in your groin (upper thigh), or neck. This tube is called an ablation catheter. It is then guided to your heart through the blood vessel. Radio frequency waves destroy small areas of heart tissue where abnormal heartbeats may cause an arrhythmia to start. Please see the instruction sheet given to you today.  Follow-Up:  The following dates are available for procedures:  July 8, 10, 13, 15, 20, 21, 23, 29, 30 August 3, 4, 17, 20, 26, 28  Cardiac Ablation Cardiac ablation is a procedure to disable (ablate) a small amount of heart tissue in very specific places. The heart has many electrical connections. Sometimes these connections are abnormal and can cause the heart to beat very fast or irregularly. Ablating some of the problem areas can improve the heart rhythm or return it to normal. Ablation may be done for people who:  Have Wolff-Parkinson-White syndrome.  Have fast heart rhythms (tachycardia).  Have taken medicines for an abnormal heart rhythm (arrhythmia) that were not effective or caused side effects.  Have a high-risk heartbeat that may be life-threatening. During the procedure, a small incision is made in the neck or the groin, and a long, thin, flexible tube (catheter) is inserted into the incision and moved to the heart. Small devices (electrodes) on the tip of the catheter will send out electrical currents. A type of X-ray (fluoroscopy) will be used to help guide the catheter and to provide images of the heart. Tell a health  care provider about:  Any allergies you have.  All medicines you are taking, including vitamins, herbs, eye drops, creams, and over-the-counter medicines.  Any problems you or family members have had with anesthetic medicines.  Any blood disorders you have.  Any surgeries you have had.  Any medical conditions you have, such as kidney failure.  Whether you are pregnant or may be pregnant. What are the risks? Generally, this is a safe procedure. However, problems may occur, including:  Infection.  Bruising and bleeding at the catheter insertion site.  Bleeding into the chest, especially into the sac that surrounds the heart. This is a serious complication.  Stroke or blood clots.  Damage to other structures or organs.  Allergic reaction to medicines or dyes.  Need for a permanent pacemaker if the normal electrical system is damaged. A pacemaker is a small computer that sends electrical signals to the heart and helps your heart beat normally.  The procedure not being fully effective. This may not be recognized until months later. Repeat ablation procedures are sometimes required. What happens before the procedure?  Follow instructions from your health care provider about eating or drinking restrictions.  Ask your health care provider about: ? Changing or stopping your regular medicines. This is especially important if you are taking diabetes medicines or blood thinners. ? Taking medicines such as aspirin and ibuprofen. These medicines can thin your blood. Do not take these medicines before your procedure if your health care provider instructs you not to.  Plan to have someone take you home from the hospital or clinic.  If you will be going home right  after the procedure, plan to have someone with you for 24 hours. What happens during the procedure?  To lower your risk of infection: ? Your health care team will wash or sanitize their hands. ? Your skin will be washed with  soap. ? Hair may be removed from the incision area.  An IV tube will be inserted into one of your veins.  You will be given a medicine to help you relax (sedative).  The skin on your neck or groin will be numbed.  An incision will be made in your neck or your groin.  A needle will be inserted through the incision and into a large vein in your neck or groin.  A catheter will be inserted into the needle and moved to your heart.  Dye may be injected through the catheter to help your surgeon see the area of the heart that needs treatment.  Electrical currents will be sent from the catheter to ablate heart tissue in desired areas. There are three types of energy that may be used to ablate heart tissue: ? Heat (radiofrequency energy). ? Laser energy. ? Extreme cold (cryoablation).  When the necessary tissue has been ablated, the catheter will be removed.  Pressure will be held on the catheter insertion area to prevent excessive bleeding.  A bandage (dressing) will be placed over the catheter insertion area. The procedure may vary among health care providers and hospitals. What happens after the procedure?  Your blood pressure, heart rate, breathing rate, and blood oxygen level will be monitored until the medicines you were given have worn off.  Your catheter insertion area will be monitored for bleeding. You will need to lie still for a few hours to ensure that you do not bleed from the catheter insertion area.  Do not drive for 24 hours or as long as directed by your health care provider. Summary  Cardiac ablation is a procedure to disable (ablate) a small amount of heart tissue in very specific places. Ablating some of the problem areas can improve the heart rhythm or return it to normal.  During the procedure, electrical currents will be sent from the catheter to ablate heart tissue in desired areas. This information is not intended to replace advice given to you by your health  care provider. Make sure you discuss any questions you have with your health care provider. Document Released: 02/07/2009 Document Revised: 08/10/2016 Document Reviewed: 08/10/2016 Elsevier Interactive Patient Education  2019 [employer].

## 2019-03-29 ENCOUNTER — Other Ambulatory Visit: Payer: Self-pay | Admitting: Cardiovascular Disease

## 2019-03-31 DIAGNOSIS — N390 Urinary tract infection, site not specified: Secondary | ICD-10-CM | POA: Diagnosis not present

## 2019-03-31 DIAGNOSIS — I10 Essential (primary) hypertension: Secondary | ICD-10-CM | POA: Diagnosis not present

## 2019-03-31 DIAGNOSIS — N183 Chronic kidney disease, stage 3 (moderate): Secondary | ICD-10-CM | POA: Diagnosis not present

## 2019-04-03 ENCOUNTER — Telehealth (HOSPITAL_COMMUNITY): Payer: Self-pay

## 2019-04-03 NOTE — Telephone Encounter (Signed)
Instructions left on the patient's answering machine. Asked to call back with any questions. S.Samya Siciliano EMTP

## 2019-04-05 ENCOUNTER — Other Ambulatory Visit: Payer: Self-pay

## 2019-04-05 ENCOUNTER — Ambulatory Visit (HOSPITAL_COMMUNITY): Payer: Medicare HMO | Attending: Cardiology

## 2019-04-05 DIAGNOSIS — I2510 Atherosclerotic heart disease of native coronary artery without angina pectoris: Secondary | ICD-10-CM | POA: Insufficient documentation

## 2019-04-05 DIAGNOSIS — R002 Palpitations: Secondary | ICD-10-CM | POA: Diagnosis not present

## 2019-04-05 DIAGNOSIS — I4892 Unspecified atrial flutter: Secondary | ICD-10-CM | POA: Diagnosis not present

## 2019-04-05 DIAGNOSIS — I4891 Unspecified atrial fibrillation: Secondary | ICD-10-CM | POA: Diagnosis not present

## 2019-04-05 DIAGNOSIS — I472 Ventricular tachycardia: Secondary | ICD-10-CM | POA: Insufficient documentation

## 2019-04-05 DIAGNOSIS — I10 Essential (primary) hypertension: Secondary | ICD-10-CM | POA: Insufficient documentation

## 2019-04-05 DIAGNOSIS — R9439 Abnormal result of other cardiovascular function study: Secondary | ICD-10-CM | POA: Insufficient documentation

## 2019-04-05 DIAGNOSIS — I4729 Other ventricular tachycardia: Secondary | ICD-10-CM

## 2019-04-05 LAB — MYOCARDIAL PERFUSION IMAGING
LV dias vol: 119 mL (ref 62–150)
LV sys vol: 52 mL
Peak HR: 81 {beats}/min
Rest HR: 60 {beats}/min
SDS: 1
SRS: 0
SSS: 1
TID: 0.96

## 2019-04-05 MED ORDER — REGADENOSON 0.4 MG/5ML IV SOLN
0.4000 mg | Freq: Once | INTRAVENOUS | Status: AC
  Administered 2019-04-05: 0.4 mg via INTRAVENOUS

## 2019-04-05 MED ORDER — TECHNETIUM TC 99M TETROFOSMIN IV KIT
11.0000 | PACK | Freq: Once | INTRAVENOUS | Status: AC | PRN
  Administered 2019-04-05: 11 via INTRAVENOUS
  Filled 2019-04-05: qty 11

## 2019-04-05 MED ORDER — TECHNETIUM TC 99M TETROFOSMIN IV KIT
32.7000 | PACK | Freq: Once | INTRAVENOUS | Status: AC | PRN
  Administered 2019-04-05: 32.7 via INTRAVENOUS
  Filled 2019-04-05: qty 33

## 2019-04-18 ENCOUNTER — Telehealth: Payer: Self-pay | Admitting: Internal Medicine

## 2019-04-18 DIAGNOSIS — I4892 Unspecified atrial flutter: Secondary | ICD-10-CM

## 2019-04-18 NOTE — Telephone Encounter (Signed)
New Message   Patient calling in to scheduled procedure that was discussed with Dr. Lovena Le on previous visit (03/28/19). Patient states that he would like to have the procedure done on May 04, 2019, but there is no active request in the system for any procedure. Please assist with this request.

## 2019-04-20 NOTE — Telephone Encounter (Signed)
Returned call to Pt.  Pt scheduled for July 30  Need to complete work up  Advised would call back next Tuesday

## 2019-04-24 DIAGNOSIS — R69 Illness, unspecified: Secondary | ICD-10-CM | POA: Diagnosis not present

## 2019-04-25 NOTE — Telephone Encounter (Signed)
Call returned to Pt.  Because of testing change Pt needs to reschedule procedure for August 3, 2nd case  Labs and covid testing scheduled.  Will mail instruction letter  Work up complete

## 2019-05-05 ENCOUNTER — Other Ambulatory Visit: Payer: Self-pay

## 2019-05-05 ENCOUNTER — Other Ambulatory Visit (HOSPITAL_COMMUNITY)
Admission: RE | Admit: 2019-05-05 | Discharge: 2019-05-05 | Disposition: A | Discharge status: Home / Self Care | Payer: Medicare HMO | Source: Ambulatory Visit | Attending: Internal Medicine | Admitting: Internal Medicine

## 2019-05-05 ENCOUNTER — Other Ambulatory Visit: Payer: Medicare HMO | Admitting: *Deleted

## 2019-05-05 DIAGNOSIS — Z20828 Contact with and (suspected) exposure to other viral communicable diseases: Secondary | ICD-10-CM | POA: Insufficient documentation

## 2019-05-05 DIAGNOSIS — I4892 Unspecified atrial flutter: Secondary | ICD-10-CM | POA: Diagnosis not present

## 2019-05-05 DIAGNOSIS — Z01812 Encounter for preprocedural laboratory examination: Secondary | ICD-10-CM | POA: Insufficient documentation

## 2019-05-05 LAB — CBC WITH DIFFERENTIAL/PLATELET
Basophils Absolute: 0 10*3/uL (ref 0.0–0.2)
Basos: 1 %
EOS (ABSOLUTE): 0.4 10*3/uL (ref 0.0–0.4)
Eos: 9 %
Hematocrit: 42.4 % (ref 37.5–51.0)
Hemoglobin: 14.3 g/dL (ref 13.0–17.7)
Immature Grans (Abs): 0 10*3/uL (ref 0.0–0.1)
Immature Granulocytes: 0 %
Lymphocytes Absolute: 1.8 10*3/uL (ref 0.7–3.1)
Lymphs: 39 %
MCH: 31.2 pg (ref 26.6–33.0)
MCHC: 33.7 g/dL (ref 31.5–35.7)
MCV: 93 fL (ref 79–97)
Monocytes Absolute: 0.6 10*3/uL (ref 0.1–0.9)
Monocytes: 12 %
Neutrophils Absolute: 1.8 10*3/uL (ref 1.4–7.0)
Neutrophils: 39 %
Platelets: 167 10*3/uL (ref 150–450)
RBC: 4.58 x10E6/uL (ref 4.14–5.80)
RDW: 12.5 % (ref 11.6–15.4)
WBC: 4.6 10*3/uL (ref 3.4–10.8)

## 2019-05-05 LAB — SARS CORONAVIRUS 2 (TAT 6-24 HRS): SARS Coronavirus 2: NEGATIVE

## 2019-05-05 LAB — BASIC METABOLIC PANEL
BUN/Creatinine Ratio: 14 (ref 10–24)
BUN: 25 mg/dL (ref 8–27)
CO2: 23 mmol/L (ref 20–29)
Calcium: 9.4 mg/dL (ref 8.6–10.2)
Chloride: 99 mmol/L (ref 96–106)
Creatinine, Ser: 1.8 mg/dL — ABNORMAL HIGH (ref 0.76–1.27)
GFR calc Af Amer: 42 mL/min/{1.73_m2} — ABNORMAL LOW (ref >59)
GFR calc non Af Amer: 36 mL/min/{1.73_m2} — ABNORMAL LOW (ref >59)
Glucose: 113 mg/dL — ABNORMAL HIGH (ref 65–99)
Potassium: 4.1 mmol/L (ref 3.5–5.2)
Sodium: 138 mmol/L (ref 134–144)

## 2019-05-08 ENCOUNTER — Ambulatory Visit (HOSPITAL_COMMUNITY)
Admission: RE | Admit: 2019-05-08 | Discharge: 2019-05-08 | Disposition: A | Discharge status: Home / Self Care | Payer: Medicare HMO | Attending: Internal Medicine | Admitting: Internal Medicine

## 2019-05-08 ENCOUNTER — Other Ambulatory Visit: Payer: Self-pay

## 2019-05-08 ENCOUNTER — Ambulatory Visit (HOSPITAL_COMMUNITY)
Admission: RE | Disposition: A | Discharge status: Home / Self Care | Payer: Self-pay | Source: Home / Self Care | Attending: Internal Medicine

## 2019-05-08 DIAGNOSIS — I483 Typical atrial flutter: Secondary | ICD-10-CM | POA: Diagnosis not present

## 2019-05-08 DIAGNOSIS — I4892 Unspecified atrial flutter: Secondary | ICD-10-CM | POA: Diagnosis present

## 2019-05-08 DIAGNOSIS — J4540 Moderate persistent asthma, uncomplicated: Secondary | ICD-10-CM | POA: Insufficient documentation

## 2019-05-08 DIAGNOSIS — Z79899 Other long term (current) drug therapy: Secondary | ICD-10-CM | POA: Insufficient documentation

## 2019-05-08 DIAGNOSIS — Z7951 Long term (current) use of inhaled steroids: Secondary | ICD-10-CM | POA: Diagnosis not present

## 2019-05-08 DIAGNOSIS — Z8249 Family history of ischemic heart disease and other diseases of the circulatory system: Secondary | ICD-10-CM | POA: Diagnosis not present

## 2019-05-08 DIAGNOSIS — I10 Essential (primary) hypertension: Secondary | ICD-10-CM | POA: Insufficient documentation

## 2019-05-08 DIAGNOSIS — Z7901 Long term (current) use of anticoagulants: Secondary | ICD-10-CM | POA: Insufficient documentation

## 2019-05-08 DIAGNOSIS — Z96641 Presence of right artificial hip joint: Secondary | ICD-10-CM | POA: Diagnosis not present

## 2019-05-08 HISTORY — PX: A-FLUTTER ABLATION: EP1230

## 2019-05-08 SURGERY — A-FLUTTER ABLATION

## 2019-05-08 MED ORDER — MIDAZOLAM HCL 5 MG/5ML IJ SOLN
INTRAMUSCULAR | Status: AC
  Filled 2019-05-08: qty 5

## 2019-05-08 MED ORDER — BUPIVACAINE HCL (PF) 0.25 % IJ SOLN
INTRAMUSCULAR | Status: AC
  Filled 2019-05-08: qty 30

## 2019-05-08 MED ORDER — SODIUM CHLORIDE 0.9 % IV SOLN
INTRAVENOUS | Status: DC
  Administered 2019-05-08: 09:00:00 via INTRAVENOUS

## 2019-05-08 MED ORDER — HEPARIN (PORCINE) IN NACL 1000-0.9 UT/500ML-% IV SOLN
INTRAVENOUS | Status: AC
  Filled 2019-05-08: qty 500

## 2019-05-08 MED ORDER — SODIUM CHLORIDE 0.9 % IV SOLN: 250.0000 mL | INTRAVENOUS | Status: DC | PRN

## 2019-05-08 MED ORDER — SODIUM CHLORIDE 0.9% FLUSH: 3.0000 mL | INTRAVENOUS | Status: DC | PRN

## 2019-05-08 MED ORDER — FENTANYL CITRATE (PF) 100 MCG/2ML IJ SOLN
INTRAMUSCULAR | Status: AC
  Filled 2019-05-08: qty 2

## 2019-05-08 MED ORDER — HEPARIN (PORCINE) IN NACL 1000-0.9 UT/500ML-% IV SOLN
INTRAVENOUS | Status: DC | PRN
  Administered 2019-05-08 (×2): 500 mL

## 2019-05-08 MED ORDER — MIDAZOLAM HCL 5 MG/5ML IJ SOLN
INTRAMUSCULAR | Status: DC | PRN
  Administered 2019-05-08 (×7): 1 mg via INTRAVENOUS
  Administered 2019-05-08: 2 mg via INTRAVENOUS
  Administered 2019-05-08 (×2): 1 mg via INTRAVENOUS

## 2019-05-08 MED ORDER — BUPIVACAINE HCL (PF) 0.25 % IJ SOLN
INTRAMUSCULAR | Status: DC | PRN
  Administered 2019-05-08: 60 mL

## 2019-05-08 MED ORDER — FENTANYL CITRATE (PF) 100 MCG/2ML IJ SOLN
INTRAMUSCULAR | Status: DC | PRN
  Administered 2019-05-08: 12.5 ug via INTRAVENOUS
  Administered 2019-05-08: 25 ug via INTRAVENOUS
  Administered 2019-05-08 (×2): 12.5 ug via INTRAVENOUS
  Administered 2019-05-08 (×2): 25 ug via INTRAVENOUS
  Administered 2019-05-08 (×3): 12.5 ug via INTRAVENOUS

## 2019-05-08 SURGICAL SUPPLY — 15 items
BAG SNAP BAND KOVER 36X36 (MISCELLANEOUS) ×1 IMPLANT
CATH EZ STEER NAV 8MM F-J CUR (ABLATOR) ×1 IMPLANT
CATH HEX JOS 2-5-2 65CM 6F REP (CATHETERS) ×1 IMPLANT
CATH JOSEPH QUAD ALLRED 6F REP (CATHETERS) ×1 IMPLANT
CATH WEBSTER BI DIR CS D-F CRV (CATHETERS) ×1 IMPLANT
PACK EP LATEX FREE (CUSTOM PROCEDURE TRAY) ×2
PACK EP LF (CUSTOM PROCEDURE TRAY) ×1 IMPLANT
PAD PRO RADIOLUCENT 2001M-C (PAD) ×2 IMPLANT
PATCH CARTO3 (PAD) ×1 IMPLANT
SHEATH PINNACLE 6F 10CM (SHEATH) ×1 IMPLANT
SHEATH PINNACLE 7F 10CM (SHEATH) ×2 IMPLANT
SHEATH PINNACLE 8F 10CM (SHEATH) ×1 IMPLANT
SHEATH PINNACLE VASC 9FR (SHEATH) ×1 IMPLANT
SHEATH SWARTZ RAMP 8.5F 60CM (SHEATH) ×1 IMPLANT
SHIELD RADPAD SCOOP 12X17 (MISCELLANEOUS) ×1 IMPLANT

## 2019-05-08 NOTE — H&P (Signed)
HPI Paul Roach is referred today by Dr. Radford Pax for evaluation of atrial flutter. He is a pleasant 73 yo man with a h/o HTN who was found to be in atrial flutter when he underwent a 2D echo several weeks ago. He was placed on Eliquis. He has been stable. He denies palpitations. He does not have edema. His echo showed a low normal EF.      Allergies  Allergen Reactions  . Altace [Ramipril]   . Cozaar [Losartan Potassium]            Current Outpatient Medications  Medication Sig Dispense Refill  . amLODipine (NORVASC) 5 MG tablet Take 5 mg by mouth daily.    Marland Kitchen apixaban (ELIQUIS) 5 MG TABS tablet Take 1 tablet (5 mg total) by mouth 2 (two) times daily. 180 tablet 3  . fluticasone (FLONASE) 50 MCG/ACT nasal spray 1 spray per nostril twice daily if needed for stuffy nose. 48 g 3  . Fluticasone-Salmeterol (ADVAIR DISKUS) 100-50 MCG/DOSE AEPB One puff twice daily to prevent coughing or wheezing 180 each 2  . hydrALAZINE (APRESOLINE) 100 MG tablet Take 100 mg by mouth 3 (three) times daily.    . metoprolol tartrate (LOPRESSOR) 25 MG tablet Take 1 tablet (25 mg total) by mouth 2 (two) times daily. 180 tablet 0  . Multiple Vitamin (MULTIVITAMIN) tablet Take 1 tablet by mouth daily.    Marland Kitchen PROAIR RESPICLICK 937 (90 Base) MCG/ACT AEPB Inhale 90 mcg into the lungs every 4 (four) hours. 3 each 0   No current facility-administered medications for this visit.          Past Medical History:  Diagnosis Date  . Essential hypertension 07/18/2009   Qualifier: Diagnosis of  By: [name], [name]    . Fatigue   . Hypertension   . KIDNEY CANCER 07/18/2009   s/p right neprectomy  . Moderate persistent asthma 07/18/2009       . Nasal polyposis 12/16/2016  . Onychomycosis 12/19/2014  . Pain in lower limb 12/19/2014  . Palpitations   . Perennial allergic rhinitis with nonallergic component 06/17/2016  . Sinusitis, maxillary, chronic 11/09/2012    ROS:   All systems  reviewed and negative except as noted in the HPI.        Past Surgical History:  Procedure Laterality Date  . HEMORRHOID SURGERY    . right ankle surgery    . right hip replacement    . right kidney removed  2000   for renal ca  . SINOSCOPY            Family History  Problem Relation Age of Onset  . Hypertension Mother   . Diabetes Mother   . Colon cancer Sister   . Lung disease Neg Hx   . Allergic rhinitis Neg Hx   . Angioedema Neg Hx   . Asthma Neg Hx   . Eczema Neg Hx   . Immunodeficiency Neg Hx   . Urticaria Neg Hx      Social History        Socioeconomic History  . Marital status: Widowed    Spouse name: Not on file  . Number of children: 3  . Years of education: Not on file  . Highest education level: Not on file  Occupational History  . Occupation: Retired    Comment: Patent attorney  Social Needs  . Financial resource strain: Not on file  . Food insecurity    Worry: Not  on file    Inability: Not on file  . Transportation needs    Medical: Not on file    Non-medical: Not on file  Tobacco Use  . Smoking status: Never Smoker  . Smokeless tobacco: Never Used  Substance and Sexual Activity  . Alcohol use: Yes    Alcohol/week: 0.0 standard drinks    Comment: 1-2 drinks/month  . Drug use: No  . Sexual activity: Not on file  Lifestyle  . Physical activity    Days per week: Not on file    Minutes per session: Not on file  . Stress: Not on file  Relationships  . Social Herbalist on phone: Not on file    Gets together: Not on file    Attends religious service: Not on file    Active member of club or organization: Not on file    Attends meetings of clubs or organizations: Not on file    Relationship status: Not on file  . Intimate partner violence    Fear of current or ex partner: Not on file    Emotionally abused: Not on file    Physically abused: Not on file     Forced sexual activity: Not on file  Other Topics Concern  . Not on file  Social History Narrative   Originally from [place]. Moved to [place] in 1996. Has also lived in [place] state. Has traveled to [place], [place], [place], [place], [place], [place], [place], [place], [place], [place], [place], [place], [place], [place], [place], [place], [place], [place], & [place]. 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 No pets currently. No bird, mold or hot tub exposure. Enjoys exercising regularly, fishing & traveling.      BP (!) 146/88   Pulse 74   Ht 6\' 1"  (1.854 m)   Wt 230 lb (104.3 kg)   SpO2 98%   BMI 30.34 kg/m   Physical Exam:  Well appearing NAD HEENT: Unremarkable Neck:  No JVD, no thyromegally Lymphatics:  No adenopathy Back:  No CVA tenderness Lungs:  No increased work of breathing HEART:  IRegular rate rhythm Abd:  soft, positive bowel sounds, no organomegally, no rebound, no guarding Ext:  2 plus pulses, no edema, no cyanosis, no clubbing Skin:  No rashes no nodules Neuro:  CN II through XII intact, motor grossly intact  EKG - atrial flutter with a controlled VR. PVC's  Assess/Plan: 1. Atrial flutter - I have discussed the indications/risks/benefits/goals/expectations of EP study and catheter ablation and he will call us if he wishes to proceed. 2. HTN - his bp is up a bit. He may require med adjustment in the future.  [name].D.

## 2019-05-08 NOTE — Progress Notes (Signed)
    6,7 and 8 Fr. R F/V and 7 Fr R IJ sheths were  pulled manually and pressure was held for 15 min.. R groin area is soft and non tender. R IJ area is also soft and non tender. Petroleum soaked gauze was applied at this site.   R DP was palpable before and after the sheath pull.  Bed rest started at 1400 X 6 hr.  HR 79 SR with Vent.  Bigeminy BP 141/75 sPO2 97% on R/A

## 2019-05-08 NOTE — Discharge Instructions (Signed)
Cardiac Ablation, Care After This sheet gives you information about how to care for yourself after your procedure. Your health care provider may also give you more specific instructions. If you have problems or questions, contact your health care provider. What can I expect after the procedure? After the procedure, it is common to have:  Bruising around your puncture site.  Tenderness around your puncture site.  Skipped heartbeats.  Tiredness (fatigue). Follow these instructions at home: Puncture site care   Follow instructions from your health care provider about how to take care of your puncture site. Make sure you: ? Wash your hands with soap and water before you change your bandage (dressing). If soap and water are not available, use hand sanitizer. ? Change your dressing as told by your health care provider. ? Leave stitches (sutures), skin glue, or adhesive strips in place. These skin closures may need to stay in place for up to 2 weeks. If adhesive strip edges start to loosen and curl up, you may trim the loose edges. Do not remove adhesive strips completely unless your health care provider tells you to do that.  Check your puncture site every day for signs of infection. Check for: ? Redness, swelling, or pain. ? Fluid or blood. If your puncture site starts to bleed, lie down on your back, apply firm pressure to the area, and contact your health care provider. ? Warmth. ? Pus or a bad smell. Driving  Ask your health care provider when it is safe for you to drive again after the procedure.  Do not drive or use heavy machinery while taking prescription pain medicine.  Do not drive for 24 hours if you were given a medicine to help you relax (sedative) during your procedure. Activity  Avoid activities that take a lot of effort for at least 3 days after your procedure.  Do not lift anything that is heavier than 10 lb (4.5 kg), or the limit that you are told, until your health  care provider says that it is safe.  Return to your normal activities as told by your health care provider. Ask your health care provider what activities are safe for you. General instructions  Take over-the-counter and prescription medicines only as told by your health care provider.  Do not use any products that contain nicotine or tobacco, such as cigarettes and e-cigarettes. If you need help quitting, ask your health care provider.  Do not take baths, swim, or use a hot tub until your health care provider approves.  Do not drink alcohol for 24 hours after your procedure.  Keep all follow-up visits as told by your health care provider. This is important. Contact a health care provider if:  You have redness, mild swelling, or pain around your puncture site.  You have fluid or blood coming from your puncture site that stops after applying firm pressure to the area.  Your puncture site feels warm to the touch.  You have pus or a bad smell coming from your puncture site.  You have a fever.  You have chest pain or discomfort that spreads to your neck, jaw, or arm.  You are sweating a lot.  You feel nauseous.  You have a fast or irregular heartbeat.  You have shortness of breath.  You are dizzy or light-headed and feel the need to lie down.  You have pain or numbness in the arm or leg closest to your puncture site. Get help right away if:  Your puncture  site suddenly swells.  Your puncture site is bleeding and the bleeding does not stop after applying firm pressure to the area. These symptoms may represent a serious problem that is an emergency. Do not wait to see if the symptoms will go away. Get medical help right away. Call your local emergency services (911 in the [place] Do not drive yourself to the hospital. Summary  After the procedure, it is normal to have bruising and tenderness at the puncture site in your groin, neck, or forearm.  Check your puncture site every  day for signs of infection.  Get help right away if your puncture site is bleeding and the bleeding does not stop after applying firm pressure to the area. This is a medical emergency. This information is not intended to replace advice given to you by your health care provider. Make sure you discuss any questions you have with your health care provider. Document Released: 12/31/2016 Document Revised: 09/03/2017 Document Reviewed: 12/31/2016 Elsevier Patient Education  [employer].    Femoral Site Care This sheet gives you information about how to care for yourself after your procedure. Your health care provider may also give you more specific instructions. If you have problems or questions, contact your health care provider. What can I expect after the procedure? After the procedure, it is common to have:  Bruising that usually fades within 1-2 weeks.  Tenderness at the site. Follow these instructions at home: Wound care  Follow instructions from your health care provider about how to take care of your insertion site. Make sure you: ? Wash your hands with soap and water before you change your bandage (dressing). If soap and water are not available, use hand sanitizer. ? Change your dressing as told by your health care provider. ? Leave stitches (sutures), skin glue, or adhesive strips in place. These skin closures may need to stay in place for 2 weeks or longer. If adhesive strip edges start to loosen and curl up, you may trim the loose edges. Do not remove adhesive strips completely unless your health care provider tells you to do that.  Do not take baths, swim, or use a hot tub until your health care provider approves.  You may shower 24-48 hours after the procedure or as told by your health care provider. ? Gently wash the site with plain soap and water. ? Pat the area dry with a clean towel. ? Do not rub the site. This may cause bleeding.  Do not apply powder or lotion to  the site. Keep the site clean and dry.  Check your femoral site every day for signs of infection. Check for: ? Redness, swelling, or pain. ? Fluid or blood. ? Warmth. ? Pus or a bad smell. Activity  For the first 2-3 days after your procedure, or as long as directed: ? Avoid climbing stairs as much as possible. ? Do not squat.  Do not lift anything that is heavier than 10 lb (4.5 kg), or the limit that you are told, until your health care provider says that it is safe.  Rest as directed. ? Avoid sitting for a long time without moving. Get up to take short walks every 1-2 hours.  Do not drive for 24 hours if you were given a medicine to help you relax (sedative). General instructions  Take over-the-counter and prescription medicines only as told by your health care provider.  Keep all follow-up visits as told by your health care provider. This is  important. Contact a health care provider if you have:  A fever or chills.  You have redness, swelling, or pain around your insertion site. Get help right away if:  The catheter insertion area swells very fast.  You pass out.  You suddenly start to sweat or your skin gets clammy.  The catheter insertion area is bleeding, and the bleeding does not stop when you hold steady pressure on the area.  The area near or just beyond the catheter insertion site becomes pale, cool, tingly, or numb. These symptoms may represent a serious problem that is an emergency. Do not wait to see if the symptoms will go away. Get medical help right away. Call your local emergency services (911 in the [place] Do not drive yourself to the hospital. Summary  After the procedure, it is common to have bruising that usually fades within 1-2 weeks.  Check your femoral site every day for signs of infection.  Do not lift anything that is heavier than 10 lb (4.5 kg), or the limit that you are told, until your health care provider says that it is safe. This  information is not intended to replace advice given to you by your health care provider. Make sure you discuss any questions you have with your health care provider. Document Released: 05/25/2014 Document Revised: 10/04/2017 Document Reviewed: 10/04/2017 Elsevier Patient Education  2020 [employer].

## 2019-05-09 ENCOUNTER — Encounter (HOSPITAL_COMMUNITY): Payer: Self-pay | Admitting: Internal Medicine

## 2019-05-09 DIAGNOSIS — J4540 Moderate persistent asthma, uncomplicated: Secondary | ICD-10-CM | POA: Diagnosis not present

## 2019-05-09 DIAGNOSIS — Z7901 Long term (current) use of anticoagulants: Secondary | ICD-10-CM | POA: Diagnosis not present

## 2019-05-09 DIAGNOSIS — Z7951 Long term (current) use of inhaled steroids: Secondary | ICD-10-CM | POA: Diagnosis not present

## 2019-05-09 DIAGNOSIS — I483 Typical atrial flutter: Secondary | ICD-10-CM | POA: Diagnosis not present

## 2019-05-09 DIAGNOSIS — Z79899 Other long term (current) drug therapy: Secondary | ICD-10-CM | POA: Diagnosis not present

## 2019-05-09 DIAGNOSIS — Z96641 Presence of right artificial hip joint: Secondary | ICD-10-CM | POA: Diagnosis not present

## 2019-05-09 DIAGNOSIS — I10 Essential (primary) hypertension: Secondary | ICD-10-CM | POA: Diagnosis not present

## 2019-05-09 DIAGNOSIS — Z8249 Family history of ischemic heart disease and other diseases of the circulatory system: Secondary | ICD-10-CM | POA: Diagnosis not present

## 2019-05-10 MED FILL — Bupivacaine HCl Preservative Free (PF) Inj 0.25%: INTRAMUSCULAR | Qty: 30 | Status: AC

## 2019-05-22 ENCOUNTER — Telehealth: Payer: Self-pay | Admitting: Internal Medicine

## 2019-05-22 NOTE — Telephone Encounter (Signed)
Left detailed message per DPR.  Advised Pt to continue Eliquis until his f/u appt with Dr. Lovena Le.

## 2019-05-22 NOTE — Telephone Encounter (Signed)
  Pt c/o medication issue: 1. Name of Medication: apixaban (ELIQUIS) 5 MG TABS tablet  2. How are you currently taking this medication (dosage and times per day)? Take 1 tablet (5 mg total) by mouth 2 (two) times daily  3. Are you having a reaction (difficulty breathing--STAT)?  NA  4. What is your medication issue? Patient states he had surgery a couple weeks ago and he is not sure if he is supposed to continue taking Eliquis

## 2019-06-05 ENCOUNTER — Encounter: Payer: Self-pay | Admitting: Internal Medicine

## 2019-06-05 ENCOUNTER — Other Ambulatory Visit: Payer: Self-pay

## 2019-06-05 ENCOUNTER — Ambulatory Visit (INDEPENDENT_AMBULATORY_CARE_PROVIDER_SITE_OTHER): Payer: Medicare HMO | Admitting: Internal Medicine

## 2019-06-05 VITALS — BP 144/90 | HR 58 | Ht 73.0 in | Wt 230.8 lb

## 2019-06-05 DIAGNOSIS — I4892 Unspecified atrial flutter: Secondary | ICD-10-CM

## 2019-06-05 MED ORDER — METOPROLOL TARTRATE 25 MG PO TABS: 37.5000 mg | ORAL_TABLET | Freq: Two times a day (BID) | ORAL | 3 refills | Status: DC

## 2019-06-05 NOTE — Progress Notes (Signed)
HPI Paul Roach returns today for followup of atrial flutter and HTN,s /p ablation. He is frustrated by his inability to work out. He denies chest pain, sob, or palpitations. No edema. He notes that his pressure is a little elevated.  Allergies  Allergen Reactions  . Altace [Ramipril] Other (See Comments)    Patient is unaware of allergy  . Cozaar [Losartan Potassium] Other (See Comments)    Patient is unaware of allergy     Current Outpatient Medications  Medication Sig Dispense Refill  . amLODipine (NORVASC) 5 MG tablet Take 5 mg by mouth every evening.     Marland Kitchen apixaban (ELIQUIS) 5 MG TABS tablet Take 1 tablet (5 mg total) by mouth 2 (two) times daily. 180 tablet 3  . budesonide-formoterol (SYMBICORT) 160-4.5 MCG/ACT inhaler Inhale 2 puffs into the lungs 2 (two) times daily.    . hydrALAZINE (APRESOLINE) 100 MG tablet Take 100 mg by mouth 3 (three) times daily.    . hydrochlorothiazide (HYDRODIURIL) 12.5 MG tablet Take 12.5 mg by mouth daily as needed (ankle swelling).    . metoprolol tartrate (LOPRESSOR) 25 MG tablet Take 1 tablet by mouth twice daily (Patient taking differently: Take 25 mg by mouth 2 (two) times a day. ) 180 tablet 3  . Multiple Vitamin (MULTIVITAMIN WITH MINERALS) TABS tablet Take 1 tablet by mouth 2 (two) times a week.     No current facility-administered medications for this visit.      Past Medical History:  Diagnosis Date  . Essential hypertension 07/18/2009   Qualifier: Diagnosis of  By: [name], [name]    . Fatigue   . Hypertension   . KIDNEY CANCER 07/18/2009   s/p right neprectomy  . Moderate persistent asthma 07/18/2009       . Nasal polyposis 12/16/2016  . Onychomycosis 12/19/2014  . Pain in lower limb 12/19/2014  . Palpitations   . Perennial allergic rhinitis with nonallergic component 06/17/2016  . Sinusitis, maxillary, chronic 11/09/2012    ROS:   All systems reviewed and negative except as noted in the HPI.   Past Surgical  History:  Procedure Laterality Date  . A-FLUTTER ABLATION N/A 05/08/2019   Procedure: A-FLUTTER ABLATION;  Surgeon: [name], MD;  Location: [facility];  Service: Cardiovascular;  Laterality: N/A;  . HEMORRHOID SURGERY    . right ankle surgery    . right hip replacement    . right kidney removed  2000   for renal ca  . SINOSCOPY       Family History  Problem Relation Age of Onset  . Hypertension Mother   . Diabetes Mother   . Colon cancer Sister   . Lung disease Neg Hx   . Allergic rhinitis Neg Hx   . Angioedema Neg Hx   . Asthma Neg Hx   . Eczema Neg Hx   . Immunodeficiency Neg Hx   . Urticaria Neg Hx      Social History   Socioeconomic History  . Marital status: Married    Spouse name: Not on file  . Number of children: 3  . Years of education: Not on file  . Highest education level: Not on file  Occupational History  . Occupation: Retired    Comment: Patent attorney  Social Needs  . Financial resource strain: Not on file  . Food insecurity    Worry: Not on file    Inability: Not on file  . Transportation needs  Medical: Not on file    Non-medical: Not on file  Tobacco Use  . Smoking status: Never Smoker  . Smokeless tobacco: Never Used  Substance and Sexual Activity  . Alcohol use: Yes    Alcohol/week: 0.0 standard drinks    Comment: 1-2 drinks/month  . Drug use: No  . Sexual activity: Not on file  Lifestyle  . Physical activity    Days per week: Not on file    Minutes per session: Not on file  . Stress: Not on file  Relationships  . Social Herbalist on phone: Not on file    Gets together: Not on file    Attends religious service: Not on file    Active member of club or organization: Not on file    Attends meetings of clubs or organizations: Not on file    Relationship status: Not on file  . Intimate partner violence    Fear of current or ex partner: Not on file    Emotionally abused: Not on file    Physically  abused: Not on file    Forced sexual activity: Not on file  Other Topics Concern  . Not on file  Social History Narrative   Originally from [place]. Moved to [place] in 1996. Has also lived in [place] state. Has traveled to [place], [place], [place], [place], [place], [place], [place], [place], [place], [place], [place], [place], [place], [place], [place], [place], [place], [place], & [place]. 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 No pets currently. No bird, mold or hot tub exposure. Enjoys exercising regularly, fishing & traveling.      BP (!) 144/90   Pulse (!) 58   Ht 6\' 1"  (1.854 m)   Wt 230 lb 12.8 oz (104.7 kg)   SpO2 98%   BMI 30.45 kg/m   Physical Exam:  Well appearing NAD HEENT: Unremarkable Neck:  No JVD, no thyromegally Lymphatics:  No adenopathy Back:  No CVA tenderness Lungs:  Clear with no wheezes HEART:  Regular rate rhythm, no murmurs, no rubs, no clicks Abd:  soft, positive bowel sounds, no organomegally, no rebound, no guarding Ext:  2 plus pulses, no edema, no cyanosis, no clubbing Skin:  No rashes no nodules Neuro:  CN II through XII intact, motor grossly intact  EKG - NSR with PVC's  Assess/Plan: 1. Atrial flutter - he is doing well, s/p catheter ablation. 2. PVC's - he is minimally symptomatic. We will increase the metoprolol. 3. HTN - his pressure is a little high and we will increase metoprolol to 37.5 bid  Paul Roach.D.

## 2019-06-05 NOTE — Patient Instructions (Addendum)
Medication Instructions:  Your physician has recommended you make the following change in your medication:  1.  Increase your metoprolol tartrate 25 mg---Take 1.5 tablets (37.5 mg) by mouth twice a day.   Labwork: None ordered.  Testing/Procedures: None ordered.  Follow-Up: Your physician wants you to follow-up in: 6 months with Dr. Lovena Le.   You will receive a reminder letter in the mail two months in advance. If you don't receive a letter, please call our office to schedule the follow-up appointment.   Any Other Special Instructions Will Be Listed Below (If Applicable).  If you need a refill on your cardiac medications before your next appointment, please call your pharmacy.

## 2019-06-06 DIAGNOSIS — R69 Illness, unspecified: Secondary | ICD-10-CM | POA: Diagnosis not present

## 2019-06-21 DIAGNOSIS — I10 Essential (primary) hypertension: Secondary | ICD-10-CM | POA: Diagnosis not present

## 2019-06-21 DIAGNOSIS — N183 Chronic kidney disease, stage 3 (moderate): Secondary | ICD-10-CM | POA: Diagnosis not present

## 2019-06-26 DIAGNOSIS — H5203 Hypermetropia, bilateral: Secondary | ICD-10-CM | POA: Diagnosis not present

## 2019-06-26 DIAGNOSIS — H52223 Regular astigmatism, bilateral: Secondary | ICD-10-CM | POA: Diagnosis not present

## 2019-06-26 DIAGNOSIS — Z961 Presence of intraocular lens: Secondary | ICD-10-CM | POA: Diagnosis not present

## 2019-06-26 DIAGNOSIS — H524 Presbyopia: Secondary | ICD-10-CM | POA: Diagnosis not present

## 2019-06-30 DIAGNOSIS — N183 Chronic kidney disease, stage 3 (moderate): Secondary | ICD-10-CM | POA: Diagnosis not present

## 2019-06-30 DIAGNOSIS — I10 Essential (primary) hypertension: Secondary | ICD-10-CM | POA: Diagnosis not present

## 2019-06-30 DIAGNOSIS — N390 Urinary tract infection, site not specified: Secondary | ICD-10-CM | POA: Diagnosis not present

## 2019-07-10 DIAGNOSIS — Z86010 Personal history of colonic polyps: Secondary | ICD-10-CM | POA: Diagnosis not present

## 2019-07-11 ENCOUNTER — Telehealth: Payer: Self-pay

## 2019-07-11 DIAGNOSIS — Z23 Encounter for immunization: Secondary | ICD-10-CM | POA: Diagnosis not present

## 2019-07-11 NOTE — Telephone Encounter (Signed)
   [facility] Pre-operative Risk Assessment    Request for surgical clearance:  1. What type of surgery is being performed? COLONOSCOPY     2. When is this surgery scheduled? TBD  3. What type of clearance is required (medical clearance vs. Pharmacy clearance to hold med vs. Both)? PHARMACY   4. Are there any medications that need to be held prior to surgery and how long? ELIQUIS    5. Practice name and name of physician performing surgery? [facility], DR. Michail Sermon   6. What is your office phone number [phone]    7.   What is your office fax number [phone]  8.   Anesthesia type (None, local, MAC, general) ? NONE LISTED   Paul Roach 07/11/2019, 4:23 PM  _________________________________________________________________   (provider comments below)

## 2019-07-12 NOTE — Telephone Encounter (Signed)
I tried to reach out to Dr. Kathline Magic office at [facility] with no answer. I was calling to inform Dr. Michail Sermon of the recommendations in regards to holding Eliquis. I will fax the notes to Dr. Michail Sermon. If any questions please call the office [phone].

## 2019-07-12 NOTE — Telephone Encounter (Signed)
   Primary Cardiologist: [name], MD  Chart reviewed as part of pre-operative protocol coverage. [name] has a hx of aflutter on Eliquis for anticoagulation with a CHADS2-VASc score of  3 (HTN, AGE, AGE) and a CrCl 45 ml/min  Patient is s/p ablation on 05/08/2019. He should continue uninterrupted anticoagulation for 90 days post ablation. Ideally patient should not hold his anticoagulation prior to Aug 07, 2019. Would advise patient hold off on colonoscopy until after 08/07/2019 if possible.  After 08/07/2019 patient may hold Eliquis for 1-2 days prior to procedure.  Pre-op covering staff: - Please contact requesting surgeon's office via preferred method (i.e, phone, fax) to inform them of need to hold procedure until after 08/07/2019.   Kathyrn Drown, NP 07/12/2019, 9:19 AM

## 2019-07-12 NOTE — Telephone Encounter (Signed)
Patient with diagnosis of aflutter on Eliquis for anticoagulation.    Procedure: COLONOSCOPY Date of procedure: TBD  CHADS2-VASc score of  3 (HTN, AGE, AGE)  CrCl 45 ml/min  Patient is s/p ablation on 05/08/2019. He should continue uninterrupted anticoagulation for 90 days post ablation. Ideally patient should not hold his anticoagulation prior to Aug 07, 2019. Would advise patient hold off on colonoscopy until after 08/07/2019 if possible.  After 08/07/2019 patient may hold Eliquis for 1-2 days prior to procedure.

## 2019-08-17 ENCOUNTER — Telehealth: Payer: Self-pay | Admitting: Internal Medicine

## 2019-08-17 NOTE — Telephone Encounter (Signed)
I will route to [facility], LPN to see if pt may qualify for assistance program.

## 2019-08-17 NOTE — Telephone Encounter (Signed)
**Note De-Identified Paul Roach Obfuscation** I called [facility] and was advised that the pts cost for Eliquis is now $76.49/30 day supply and that the cost is higher than his last refill.   I called the pt and he states that he did call [employer] this morning and was advised that he is in the donut hole. The pt states that he cannot afford Eliquis at this time.  He reports that he is not out of Eliquis at this time and is trying to be proactive prior to running out.  We discussed him applying for asst through [place] pt asst program but the pt states that he has looked into that but that he and his wife earn to much money yearly to be accepted in the pt asst program.  We then discussed Coumadin and the pt is not opposed to this option.  He is advised that I am forwarding this message to our pharmacy team to contact him to discuss.

## 2019-08-17 NOTE — Telephone Encounter (Signed)
Called and spoke with patient. He has decided to pay the extra cost for Eliquis for the next two months until he is out of the donut hole. Not interested in coumadin at this time.

## 2019-08-17 NOTE — Telephone Encounter (Signed)
New Message  Pt c/o medication issue:  1. Name of Medication: apixaban (ELIQUIS) 5 MG TABS tablet  2. How are you currently taking this medication (dosage and times per day)?  Take 1 tablet (5 mg total) by mouth 2 (two) times daily.  3. Are you having a reaction (difficulty breathing--STAT)? No  4. What is your medication issue? Patient states that he is in the donut hole with insurance so his medication is no longer affordable. Patient wants to know can he get samples of the Eliquis or if he can be switched to another medication that is more affordable. Please give patient a call back to assist.

## 2019-08-17 NOTE — Telephone Encounter (Signed)
Hey Lynn, LPN, can you please advise on this matter? Thanks  ?

## 2019-08-22 DIAGNOSIS — Z1159 Encounter for screening for other viral diseases: Secondary | ICD-10-CM | POA: Diagnosis not present

## 2019-08-25 DIAGNOSIS — D123 Benign neoplasm of transverse colon: Secondary | ICD-10-CM | POA: Diagnosis not present

## 2019-08-25 DIAGNOSIS — Z86010 Personal history of colonic polyps: Secondary | ICD-10-CM | POA: Diagnosis not present

## 2019-08-25 DIAGNOSIS — D125 Benign neoplasm of sigmoid colon: Secondary | ICD-10-CM | POA: Diagnosis not present

## 2019-08-25 DIAGNOSIS — K640 First degree hemorrhoids: Secondary | ICD-10-CM | POA: Diagnosis not present

## 2019-08-25 DIAGNOSIS — D124 Benign neoplasm of descending colon: Secondary | ICD-10-CM | POA: Diagnosis not present

## 2019-08-30 DIAGNOSIS — D123 Benign neoplasm of transverse colon: Secondary | ICD-10-CM | POA: Diagnosis not present

## 2019-08-30 DIAGNOSIS — D124 Benign neoplasm of descending colon: Secondary | ICD-10-CM | POA: Diagnosis not present

## 2019-08-30 DIAGNOSIS — D125 Benign neoplasm of sigmoid colon: Secondary | ICD-10-CM | POA: Diagnosis not present

## 2019-10-18 ENCOUNTER — Encounter (HOSPITAL_COMMUNITY): Payer: Self-pay | Admitting: Emergency Medicine

## 2019-10-18 ENCOUNTER — Inpatient Hospital Stay (HOSPITAL_COMMUNITY)
Admission: EM | Admit: 2019-10-18 | Discharge: 2019-10-19 | DRG: 378 | Disposition: A | Discharge status: Home / Self Care | Payer: Medicare HMO | Attending: Internal Medicine | Admitting: Internal Medicine

## 2019-10-18 ENCOUNTER — Other Ambulatory Visit: Payer: Self-pay

## 2019-10-18 DIAGNOSIS — I4891 Unspecified atrial fibrillation: Secondary | ICD-10-CM | POA: Diagnosis not present

## 2019-10-18 DIAGNOSIS — K259 Gastric ulcer, unspecified as acute or chronic, without hemorrhage or perforation: Secondary | ICD-10-CM | POA: Diagnosis not present

## 2019-10-18 DIAGNOSIS — Z03818 Encounter for observation for suspected exposure to other biological agents ruled out: Secondary | ICD-10-CM | POA: Diagnosis not present

## 2019-10-18 DIAGNOSIS — K922 Gastrointestinal hemorrhage, unspecified: Secondary | ICD-10-CM | POA: Diagnosis present

## 2019-10-18 DIAGNOSIS — D649 Anemia, unspecified: Secondary | ICD-10-CM | POA: Diagnosis present

## 2019-10-18 DIAGNOSIS — Z7951 Long term (current) use of inhaled steroids: Secondary | ICD-10-CM | POA: Diagnosis not present

## 2019-10-18 DIAGNOSIS — K920 Hematemesis: Secondary | ICD-10-CM | POA: Diagnosis present

## 2019-10-18 DIAGNOSIS — I10 Essential (primary) hypertension: Secondary | ICD-10-CM | POA: Diagnosis present

## 2019-10-18 DIAGNOSIS — Z905 Acquired absence of kidney: Secondary | ICD-10-CM

## 2019-10-18 DIAGNOSIS — I493 Ventricular premature depolarization: Secondary | ICD-10-CM | POA: Insufficient documentation

## 2019-10-18 DIAGNOSIS — M2540 Effusion, unspecified joint: Secondary | ICD-10-CM | POA: Diagnosis present

## 2019-10-18 DIAGNOSIS — K921 Melena: Secondary | ICD-10-CM | POA: Diagnosis present

## 2019-10-18 DIAGNOSIS — Z79899 Other long term (current) drug therapy: Secondary | ICD-10-CM | POA: Diagnosis not present

## 2019-10-18 DIAGNOSIS — Z833 Family history of diabetes mellitus: Secondary | ICD-10-CM

## 2019-10-18 DIAGNOSIS — I129 Hypertensive chronic kidney disease with stage 1 through stage 4 chronic kidney disease, or unspecified chronic kidney disease: Secondary | ICD-10-CM | POA: Diagnosis not present

## 2019-10-18 DIAGNOSIS — R55 Syncope and collapse: Secondary | ICD-10-CM | POA: Diagnosis not present

## 2019-10-18 DIAGNOSIS — Z20822 Contact with and (suspected) exposure to covid-19: Secondary | ICD-10-CM | POA: Diagnosis present

## 2019-10-18 DIAGNOSIS — I4892 Unspecified atrial flutter: Secondary | ICD-10-CM | POA: Diagnosis present

## 2019-10-18 DIAGNOSIS — Z7901 Long term (current) use of anticoagulants: Secondary | ICD-10-CM | POA: Diagnosis not present

## 2019-10-18 DIAGNOSIS — R9431 Abnormal electrocardiogram [ECG] [EKG]: Secondary | ICD-10-CM | POA: Insufficient documentation

## 2019-10-18 DIAGNOSIS — D62 Acute posthemorrhagic anemia: Secondary | ICD-10-CM | POA: Diagnosis present

## 2019-10-18 DIAGNOSIS — I491 Atrial premature depolarization: Secondary | ICD-10-CM | POA: Diagnosis not present

## 2019-10-18 DIAGNOSIS — K254 Chronic or unspecified gastric ulcer with hemorrhage: Secondary | ICD-10-CM | POA: Diagnosis present

## 2019-10-18 DIAGNOSIS — Z85528 Personal history of other malignant neoplasm of kidney: Secondary | ICD-10-CM | POA: Diagnosis not present

## 2019-10-18 DIAGNOSIS — R42 Dizziness and giddiness: Secondary | ICD-10-CM | POA: Diagnosis not present

## 2019-10-18 DIAGNOSIS — K2971 Gastritis, unspecified, with bleeding: Principal | ICD-10-CM | POA: Diagnosis present

## 2019-10-18 DIAGNOSIS — N183 Chronic kidney disease, stage 3 unspecified: Secondary | ICD-10-CM | POA: Insufficient documentation

## 2019-10-18 DIAGNOSIS — K449 Diaphragmatic hernia without obstruction or gangrene: Secondary | ICD-10-CM | POA: Diagnosis not present

## 2019-10-18 DIAGNOSIS — J4540 Moderate persistent asthma, uncomplicated: Secondary | ICD-10-CM | POA: Diagnosis present

## 2019-10-18 DIAGNOSIS — Z8249 Family history of ischemic heart disease and other diseases of the circulatory system: Secondary | ICD-10-CM | POA: Diagnosis not present

## 2019-10-18 DIAGNOSIS — Z800 Family history of malignant neoplasm of digestive organs: Secondary | ICD-10-CM

## 2019-10-18 DIAGNOSIS — M1990 Unspecified osteoarthritis, unspecified site: Secondary | ICD-10-CM | POA: Diagnosis present

## 2019-10-18 DIAGNOSIS — Z96641 Presence of right artificial hip joint: Secondary | ICD-10-CM | POA: Diagnosis present

## 2019-10-18 HISTORY — DX: Chronic kidney disease, stage 3 unspecified: N18.30

## 2019-10-18 HISTORY — DX: Anemia, unspecified: D64.9

## 2019-10-18 HISTORY — DX: Abnormal electrocardiogram (ECG) (EKG): R94.31

## 2019-10-18 HISTORY — DX: Ventricular premature depolarization: I49.3

## 2019-10-18 HISTORY — DX: Unspecified atrial fibrillation: I48.91

## 2019-10-18 LAB — CBC WITH DIFFERENTIAL/PLATELET
Abs Immature Granulocytes: 0.02 10*3/uL (ref 0.00–0.07)
Basophils Absolute: 0 10*3/uL (ref 0.0–0.1)
Basophils Relative: 0 %
Eosinophils Absolute: 0.2 10*3/uL (ref 0.0–0.5)
Eosinophils Relative: 3 %
HCT: 31.2 % — ABNORMAL LOW (ref 39.0–52.0)
Hemoglobin: 10.4 g/dL — ABNORMAL LOW (ref 13.0–17.0)
Immature Granulocytes: 0 %
Lymphocytes Relative: 19 %
Lymphs Abs: 1.2 10*3/uL (ref 0.7–4.0)
MCH: 31.8 pg (ref 26.0–34.0)
MCHC: 33.3 g/dL (ref 30.0–36.0)
MCV: 95.4 fL (ref 80.0–100.0)
Monocytes Absolute: 0.7 10*3/uL (ref 0.1–1.0)
Monocytes Relative: 11 %
Neutro Abs: 4.2 10*3/uL (ref 1.7–7.7)
Neutrophils Relative %: 67 %
Platelets: 140 10*3/uL — ABNORMAL LOW (ref 150–400)
RBC: 3.27 MIL/uL — ABNORMAL LOW (ref 4.22–5.81)
RDW: 13 % (ref 11.5–15.5)
WBC: 6.3 10*3/uL (ref 4.0–10.5)
nRBC: 0 % (ref 0.0–0.2)

## 2019-10-18 LAB — HEMOGLOBIN AND HEMATOCRIT, BLOOD
HCT: 29.6 % — ABNORMAL LOW (ref 39.0–52.0)
Hemoglobin: 10.1 g/dL — ABNORMAL LOW (ref 13.0–17.0)

## 2019-10-18 LAB — COMPREHENSIVE METABOLIC PANEL
ALT: 22 U/L (ref 0–44)
AST: 26 U/L (ref 15–41)
Albumin: 3.3 g/dL — ABNORMAL LOW (ref 3.5–5.0)
Alkaline Phosphatase: 45 U/L (ref 38–126)
Anion gap: 8 (ref 5–15)
BUN: 51 mg/dL — ABNORMAL HIGH (ref 8–23)
CO2: 28 mmol/L (ref 22–32)
Calcium: 8.7 mg/dL — ABNORMAL LOW (ref 8.9–10.3)
Chloride: 102 mmol/L (ref 98–111)
Creatinine, Ser: 2.01 mg/dL — ABNORMAL HIGH (ref 0.61–1.24)
GFR calc Af Amer: 37 mL/min — ABNORMAL LOW (ref >60)
GFR calc non Af Amer: 32 mL/min — ABNORMAL LOW (ref >60)
Glucose, Bld: 154 mg/dL — ABNORMAL HIGH (ref 70–99)
Potassium: 4.2 mmol/L (ref 3.5–5.1)
Sodium: 138 mmol/L (ref 135–145)
Total Bilirubin: 0.6 mg/dL (ref 0.3–1.2)
Total Protein: 6.5 g/dL (ref 6.5–8.1)

## 2019-10-18 LAB — HEMOGLOBIN
Hemoglobin: 9.6 g/dL — ABNORMAL LOW (ref 13.0–17.0)
Hemoglobin: 9.8 g/dL — ABNORMAL LOW (ref 13.0–17.0)

## 2019-10-18 LAB — RESPIRATORY PANEL BY RT PCR (FLU A&B, COVID)
Influenza A by PCR: NEGATIVE
Influenza B by PCR: NEGATIVE
SARS Coronavirus 2 by RT PCR: NEGATIVE

## 2019-10-18 LAB — PROTIME-INR
INR: 1.2 (ref 0.8–1.2)
Prothrombin Time: 14.8 seconds (ref 11.4–15.2)

## 2019-10-18 LAB — SAMPLE TO BLOOD BANK

## 2019-10-18 LAB — POC OCCULT BLOOD, ED: Fecal Occult Bld: POSITIVE — AB

## 2019-10-18 MED ORDER — SODIUM CHLORIDE 0.9 % IV SOLN
8.0000 mg/h | INTRAVENOUS | Status: DC
  Administered 2019-10-18: 8 mg/h via INTRAVENOUS
  Filled 2019-10-18 (×3): qty 80

## 2019-10-18 MED ORDER — SODIUM CHLORIDE 0.9 % IV SOLN: 8.0000 mg/h | INTRAVENOUS | Status: DC

## 2019-10-18 MED ORDER — SODIUM CHLORIDE 0.9 % IV SOLN: INTRAVENOUS | Status: AC

## 2019-10-18 MED ORDER — SODIUM CHLORIDE 0.9% FLUSH
3.0000 mL | Freq: Two times a day (BID) | INTRAVENOUS | Status: DC
  Administered 2019-10-18: 3 mL via INTRAVENOUS

## 2019-10-18 MED ORDER — SODIUM CHLORIDE 0.9 % IV SOLN
80.0000 mg | Freq: Once | INTRAVENOUS | Status: AC
  Administered 2019-10-18: 80 mg via INTRAVENOUS
  Filled 2019-10-18: qty 80

## 2019-10-18 MED ORDER — PANTOPRAZOLE SODIUM 40 MG IV SOLR: 40.0000 mg | Freq: Two times a day (BID) | INTRAVENOUS | Status: DC

## 2019-10-18 MED ORDER — ONDANSETRON HCL 4 MG/2ML IJ SOLN
4.0000 mg | Freq: Once | INTRAMUSCULAR | Status: AC
  Administered 2019-10-18: 03:00:00 4 mg via INTRAVENOUS
  Filled 2019-10-18: qty 2

## 2019-10-18 MED ORDER — FLUTICASONE FUROATE-VILANTEROL 200-25 MCG/INH IN AEPB
1.0000 | INHALATION_SPRAY | Freq: Every day | RESPIRATORY_TRACT | Status: DC
  Administered 2019-10-19: 1 via RESPIRATORY_TRACT
  Filled 2019-10-18: qty 28

## 2019-10-18 MED ORDER — METOPROLOL TARTRATE 12.5 MG HALF TABLET
12.5000 mg | ORAL_TABLET | Freq: Two times a day (BID) | ORAL | Status: DC
  Administered 2019-10-18 – 2019-10-19 (×3): 12.5 mg via ORAL
  Filled 2019-10-18 (×3): qty 1

## 2019-10-18 MED ORDER — SODIUM CHLORIDE 0.9 % IV SOLN: 250.0000 mL | INTRAVENOUS | Status: DC | PRN

## 2019-10-18 MED ORDER — ONDANSETRON HCL 4 MG PO TABS: 4.0000 mg | ORAL_TABLET | Freq: Four times a day (QID) | ORAL | Status: DC | PRN

## 2019-10-18 MED ORDER — SODIUM CHLORIDE 0.9% FLUSH: 3.0000 mL | INTRAVENOUS | Status: DC | PRN

## 2019-10-18 MED ORDER — ONDANSETRON HCL 4 MG/2ML IJ SOLN: 4.0000 mg | Freq: Four times a day (QID) | INTRAMUSCULAR | Status: DC | PRN

## 2019-10-18 NOTE — H&P (View-Only) (Signed)
[facility] Consultation Note  Referring Provider: [facility] Primary Care Physician:  [name], MD Primary Gastroenterologist:  Dr. Cannon Kettle  Reason for Consultation:  Blood in stool  HPI: Paul Osowski Sr. is a 74 y.o. male presenting with blood in stool.  Starting yesterday, began having generalized stomach upset, coffee ground emesis, and melena.  Is on apixaban, last dose yesterday.  Last bleeding several hours ago.  No abdominal pain, change in bowel habits, unintentional weight loss.  Had colonoscopy with polypectomy about 6 weeks ago.  No prior episodes of GI bleeding.  No prior endoscopy.  Been taking NSAIDs of late for joint swelling in hands.   Past Medical History:  Diagnosis Date  . Abnormal EKG   . Anemia   . Atrial fibrillation ([place])   . Essential hypertension 07/18/2009   Qualifier: Diagnosis of  By: [name], [name]    . Fatigue   . Hypertension   . KIDNEY CANCER 07/18/2009   s/p right neprectomy  . Moderate persistent asthma 07/18/2009       . Nasal polyposis 12/16/2016  . Onychomycosis 12/19/2014  . Pain in lower limb 12/19/2014  . Palpitations   . Perennial allergic rhinitis with nonallergic component 06/17/2016  . Sinusitis, maxillary, chronic 11/09/2012    Past Surgical History:  Procedure Laterality Date  . A-FLUTTER ABLATION N/A 05/08/2019   Procedure: A-FLUTTER ABLATION;  Surgeon: [name], MD;  Location: [facility];  Service: Cardiovascular;  Laterality: N/A;  . HEMORRHOID SURGERY    . right ankle surgery    . right hip replacement    . right kidney removed  2000   for renal ca  . SINOSCOPY      Prior to Admission medications   Medication Sig Start Date End Date Taking? Authorizing Provider  amLODipine (NORVASC) 5 MG tablet Take 5 mg by mouth every evening.  11/05/18  Yes [provider]  apixaban (ELIQUIS) 5 MG TABS tablet Take 1 tablet (5 mg total) by mouth 2 (two) times daily. 03/10/19 03/09/20  Yes Turner, Eber Hong, MD  budesonide-formoterol (SYMBICORT) 160-4.5 MCG/ACT inhaler Inhale 2 puffs into the lungs 2 (two) times daily.   Yes [provider]  hydrALAZINE (APRESOLINE) 100 MG tablet Take 100 mg by mouth 3 (three) times daily.   Yes [provider]  hydrochlorothiazide (HYDRODIURIL) 12.5 MG tablet Take 12.5 mg by mouth every Monday, Wednesday, and Friday.    Yes [provider]  metoprolol tartrate (LOPRESSOR) 25 MG tablet Take 1.5 tablets (37.5 mg total) by mouth 2 (two) times daily. 06/05/19 10/17/28 Yes [name], MD    Current Facility-Administered Medications  Medication Dose Route Frequency Provider Last Rate Last Admin  . 0.9 %  sodium chloride infusion  250 mL Intravenous PRN Phillips Grout, MD      . ondansetron [facility]) tablet 4 mg  4 mg Oral Q6H PRN Phillips Grout, MD       Or  . ondansetron (ZOFRAN) injection 4 mg  4 mg Intravenous Q6H PRN Phillips Grout, MD      . pantoprazole (PROTONIX) 80 mg in sodium chloride 0.9 % 250 mL (0.32 mg/mL) infusion  8 mg/hr Intravenous Continuous Horton, Barbette Hair, MD 25 mL/hr at 10/18/19 0330 8 mg/hr at 10/18/19 0330  . [START ON 10/21/2019] pantoprazole (PROTONIX) injection 40 mg  40 mg Intravenous Q12H Horton, Barbette Hair, MD      . sodium chloride flush (NS) 0.9 % injection  3 mL  3 mL Intravenous Q12H Derrill Kay A, MD   3 mL at 10/18/19 0910  . sodium chloride flush (NS) 0.9 % injection 3 mL  3 mL Intravenous PRN Phillips Grout, MD       Current Outpatient Medications  Medication Sig Dispense Refill  . amLODipine (NORVASC) 5 MG tablet Take 5 mg by mouth every evening.     Marland Kitchen apixaban (ELIQUIS) 5 MG TABS tablet Take 1 tablet (5 mg total) by mouth 2 (two) times daily. 180 tablet 3  . budesonide-formoterol (SYMBICORT) 160-4.5 MCG/ACT inhaler Inhale 2 puffs into the lungs 2 (two) times daily.    . hydrALAZINE (APRESOLINE) 100 MG tablet Take 100 mg by mouth 3 (three) times daily.    .  hydrochlorothiazide (HYDRODIURIL) 12.5 MG tablet Take 12.5 mg by mouth every Monday, Wednesday, and Friday.     . metoprolol tartrate (LOPRESSOR) 25 MG tablet Take 1.5 tablets (37.5 mg total) by mouth 2 (two) times daily. 270 tablet 3    Allergies as of 10/18/2019 - Review Complete 10/18/2019  Allergen Reaction Noted  . Altace [ramipril] Other (See Comments) 12/19/2018  . Cozaar [losartan potassium] Other (See Comments) 12/19/2018    Family History  Problem Relation Age of Onset  . Hypertension Mother   . Diabetes Mother   . Colon cancer Sister   . Lung disease Neg Hx   . Allergic rhinitis Neg Hx   . Angioedema Neg Hx   . Asthma Neg Hx   . Eczema Neg Hx   . Immunodeficiency Neg Hx   . Urticaria Neg Hx     Social History   Socioeconomic History  . Marital status: Married    Spouse name: Not on file  . Number of children: 3  . Years of education: Not on file  . Highest education level: Not on file  Occupational History  . Occupation: Retired    Comment: Patent attorney  Tobacco Use  . Smoking status: Never Smoker  . Smokeless tobacco: Never Used  Substance and Sexual Activity  . Alcohol use: Yes    Alcohol/week: 0.0 standard drinks    Comment: 1-2 drinks/month  . Drug use: No  . Sexual activity: Not on file  Other Topics Concern  . Not on file  Social History Narrative   Originally from [place]. Moved to [place] in 1996. Has also lived in [place] state. Has traveled to [place], [place], [place], [place], [place], [place], [place], [place], [place], [place], [place], [place], [place], [place], [place], [place], [place], [place], & [place]. 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 No pets currently. No bird, mold or hot tub exposure. Enjoys exercising regularly, fishing & traveling.    Social Determinants of Health   Financial Resource Strain:   . Difficulty of Paying Living Expenses: Not on file  Food  Insecurity:   . Worried About Charity fundraiser in the Last Year: Not on file  . Ran Out of Food in the Last Year: Not on file  Transportation Needs:   . Lack of Transportation (Medical): Not on file  . Lack of Transportation (Non-Medical): Not on file  Physical Activity:   . Days of Exercise per Week: Not on file  . Minutes of Exercise per Session: Not on file  Stress:   . Feeling of Stress : Not on file  Social Connections:   . Frequency of Communication with Friends  and Family: Not on file  . Frequency of Social Gatherings with Friends and Family: Not on file  . Attends Religious Services: Not on file  . Active Member of Clubs or Organizations: Not on file  . Attends Archivist Meetings: Not on file  . Marital Status: Not on file  Intimate Partner Violence:   . Fear of Current or Ex-Partner: Not on file  . Emotionally Abused: Not on file  . Physically Abused: Not on file  . Sexually Abused: Not on file    Review of Systems: as per hpi, all others negative  Physical Exam: Vital signs in last 24 hours: Temp:  [98.2 F (36.8 C)] 98.2 F (36.8 C) (01/13 0100) Pulse Rate:  [29-93] 43 (01/13 0945) Resp:  [11-26] 17 (01/13 0945) BP: (103-156)/(55-80) 115/60 (01/13 0945) SpO2:  [95 %-100 %] 95 % (01/13 0945)   General:   Alert,  Well-developed, well-nourished, pleasant and cooperative in NAD Head:  Normocephalic and atraumatic. Eyes:  Sclera clear, no icterus.   Conjunctiva pink. Ears:  Normal auditory acuity. Nose:  No deformity, discharge,  or lesions. Mouth:  No deformity or lesions.  Oropharynx pink & moist. Neck:  Supple; no masses or thyromegaly. Lungs:  Clear throughout to auscultation.   No wheezes, crackles, or rhonchi. No acute distress. Heart:  Irregularly irreegular rate and rhythm; no murmurs, clicks, rubs,  or gallops. Abdomen:  Soft, nontender and nondistended. No masses, hepatosplenomegaly or hernias noted. Normal bowel sounds, without guarding,  and without rebound.     Msk:  Symmetrical without gross deformities. Normal posture. Pulses:  Normal pulses noted. Extremities:  Without clubbing or edema. Neurologic:  Alert and  oriented x4;  grossly normal neurologically. Skin:  Intact without significant lesions or rashes. Psych:  Alert and cooperative. Normal mood and affect.   Lab Results: Recent Labs    10/18/19 0107 10/18/19 0330 10/18/19 0439  WBC 6.3  --   --   HGB 10.4* 10.1* 9.6*  HCT 31.2* 29.6*  --   PLT 140*  --   --    BMET Recent Labs    10/18/19 0107  NA 138  K 4.2  CL 102  CO2 28  GLUCOSE 154*  BUN 51*  CREATININE 2.01*  CALCIUM 8.7*   LFT Recent Labs    10/18/19 0107  PROT 6.5  ALBUMIN 3.3*  AST 26  ALT 22  ALKPHOS 45  BILITOT 0.6   PT/INR Recent Labs    10/18/19 0107  LABPROT 14.8  INR 1.2    Studies/Results: No results found.  Impression:  1.  Coffee ground emesis and melena. Normal hemodynamics.  Last episode several hours ago. In conjunction with NSAIDs, suspect peptic ulcer. 2.  Acute blood loss anemia. 3.  Chronic anticoagulation, apixaban.  Plan:  1.  Clear liquid diet, npo after midnight. 2.  Serial cbc's, PPI, transfuse as needed. 3.  Endoscopy tomorrow morning. 4.  Eliquis on hold. 5.  Risks (bleeding, infection, bowel perforation that could require surgery, sedation-related changes in cardiopulmonary systems), benefits (identification and possible treatment of source of symptoms, exclusion of certain causes of symptoms), and alternatives (watchful waiting, radiographic imaging studies, empiric medical treatment) of upper endoscopy (EGD) were explained to patient/family in detail and patient wishes to proceed. 6.  Next step in management pending endoscopy findings.   LOS: 0 days   Quindell Shere M  10/18/2019, 10:13 AM  Cell [phone] If no answer or after 5 PM call [phone]

## 2019-10-18 NOTE — Progress Notes (Addendum)
[facility] PROGRESS NOTE  Paul Roach. GGE:[id number] DOB: 1946-01-08 DOA: 10/18/2019 PCP: [name], MD  Assessment/Plan:  #1. Melena/hematemesis  in setting of chronic anticoagulation. Likely GI bleed. No further episodes of melena or hematemesis. No pain. Has been npo. Reports nsaid use. Reports colonoscopy 11/20. Requested GI consult and tentative plan for endoscopy tomorrow -hold eliquis -serial cbc -clear liquids now -npo past midnight -continue protonix gtt initiated in ED  #2. Anemia. Hg 9.6 this am. Down from 10.4 5 hours earlier and 14.3 5 months ago. Likely related to #1. Hemodynamically stable.  -serial cbc -monitor -transfuse if indicated -see #1  #3. Abnormal EKG/PVC/ ventricular trigeminy. Hx of same as well as aflutter. S/P ablation 8/20 Asymptomatic.  Home meds include metoprolol and dose was increased 8/20. -monitor -holding BB for now -monitor closely  #4. Hypertension. Controlled. Home meds include amlodipine, apresoline, HCTZ and metoprolol. -holding all BP meds for now -monitor closely  #5. Moderate persistent asthma. Stable at baseline  #. CKDIII/hx kidney cancer s/p right nephrectomy. Creatinine 2.0 which appears to be close to baseline -hold nephrotoxins -monitor urine output -gentle IV fluids -recheck in am  Code Status: full Family Communication: patient at baseline Disposition Plan: home when reacy   Consultants: [facility]  Procedures:   Antibiotics:   HPI/Subjective: Awake alert. Denies pain/nausea/discomfort.   Objective: Vitals:   10/18/19 0915 10/18/19 0945  BP: 124/66 115/60  Pulse: 67 (!) 43  Resp: 16 17  Temp:    SpO2: 97% 95%   No intake or output data in the 24 hours ending 10/18/19 1035 There were no vitals filed for this visit.  Exam:  General:  Awake alert no acute distress Cardiovascular: rrr +murmur no LE edema Respiratory: normal effort BS clear bilaterally no wheeze Abdomen:  non-distended. Non-tender +BS no guarding or rebounding Musculoskeletal: joints without swelling/erythema  Data Reviewed: Basic Metabolic Panel: Recent Labs  Lab 10/18/19 0107  NA 138  K 4.2  CL 102  CO2 28  GLUCOSE 154*  BUN 51*  CREATININE 2.01*  CALCIUM 8.7*   Liver Function Tests: Recent Labs  Lab 10/18/19 0107  AST 26  ALT 22  ALKPHOS 45  BILITOT 0.6  PROT 6.5  ALBUMIN 3.3*   No results for input(s): LIPASE, AMYLASE in the last 168 hours. No results for input(s): AMMONIA in the last 168 hours. CBC: Recent Labs  Lab 10/18/19 0107 10/18/19 0330 10/18/19 0439  WBC 6.3  --   --   NEUTROABS 4.2  --   --   HGB 10.4* 10.1* 9.6*  HCT 31.2* 29.6*  --   MCV 95.4  --   --   PLT 140*  --   --    Cardiac Enzymes: No results for input(s): CKTOTAL, CKMB, CKMBINDEX, TROPONINI in the last 168 hours. BNP (last 3 results) No results for input(s): BNP in the last 8760 hours.  ProBNP (last 3 results) No results for input(s): PROBNP in the last 8760 hours.  CBG: No results for input(s): GLUCAP in the last 168 hours.  Recent Results (from the past 240 hour(s))  Respiratory Panel by RT PCR (Flu A&B, Covid) - Nasopharyngeal Swab     Status: None   Collection Time: 10/18/19  2:38 AM   Specimen: Nasopharyngeal Swab  Result Value Ref Range Status   SARS Coronavirus 2 by RT PCR NEGATIVE NEGATIVE Final    Comment: (NOTE) SARS-CoV-2 target nucleic acids are NOT DETECTED. The SARS-CoV-2 RNA is generally detectable in upper  respiratoy specimens during the acute phase of infection. The lowest concentration of SARS-CoV-2 viral copies this assay can detect is 131 copies/mL. A negative result does not preclude SARS-Cov-2 infection and should not be used as the sole basis for treatment or other patient management decisions. A negative result may occur with  improper specimen collection/handling, submission of specimen other than nasopharyngeal swab, presence of viral mutation(s)  within the areas targeted by this assay, and inadequate number of viral copies (<131 copies/mL). A negative result must be combined with clinical observations, patient history, and epidemiological information. The expected result is Negative. Fact Sheet for Patients:  PinkCheek.be Fact Sheet for Healthcare Providers:  GravelBags.it This test is not yet ap proved or cleared by the [place] FDA and  has been authorized for detection and/or diagnosis of SARS-CoV-2 by FDA under an Emergency Use Authorization (EUA). This EUA will remain  in effect (meaning this test can be used) for the duration of the COVID-19 declaration under Section 564(b)(1) of the Act, 21 U.S.C. section 360bbb-3(b)(1), unless the authorization is terminated or revoked sooner.    Influenza A by PCR NEGATIVE NEGATIVE Final   Influenza B by PCR NEGATIVE NEGATIVE Final    Comment: (NOTE) The Xpert Xpress SARS-CoV-2/FLU/RSV assay is intended as an aid in  the diagnosis of influenza from Nasopharyngeal swab specimens and  should not be used as a sole basis for treatment. Nasal washings and  aspirates are unacceptable for Xpert Xpress SARS-CoV-2/FLU/RSV  testing. Fact Sheet for Patients: PinkCheek.be Fact Sheet for Healthcare Providers: GravelBags.it This test is not yet approved or cleared by the [place] FDA and  has been authorized for detection and/or diagnosis of SARS-CoV-2 by  FDA under an Emergency Use Authorization (EUA). This EUA will remain  in effect (meaning this test can be used) for the duration of the  Covid-19 declaration under Section 564(b)(1) of the Act, 21  U.S.C. section 360bbb-3(b)(1), unless the authorization is  terminated or revoked. Performed at [facility], [place] [address] [place], [place] [address]      Studies: No results found.  Scheduled Meds:  [START  ON 10/21/2019] pantoprazole  40 mg Intravenous Q12H   sodium chloride flush  3 mL Intravenous Q12H   Continuous Infusions:  sodium chloride     pantoprozole (PROTONIX) infusion 8 mg/hr (10/18/19 0330)    Principal Problem:   GIB (gastrointestinal bleeding) Active Problems:   Essential hypertension   Chronic anticoagulation   Melena   Hematemesis   Anemia   Unspecified atrial flutter (HCC)   Moderate persistent asthma    Time spent: 45 minutes    Callender NP  [facility]  If 7PM-7AM, please contact night-coverage at [email or web address], password [facility] 10/18/2019, 10:35 AM  LOS: 0 days

## 2019-10-18 NOTE — ED Notes (Signed)
Patient had another episode of  large amount of bloody emesis while at waiting area with pallor and diaphoresis .

## 2019-10-18 NOTE — H&P (Signed)
History and Physical    Paul Dials Sr. [id number]:[id number] DOB: [birth year] DOA: 10/18/2019  PCP: [name], MD  Patient coming from: Home  Chief Complaint: Tarry stools and vomiting  HPI: Paul Holsinger Sr. is a 74 y.o. male with medical history significant of hypertension, A. fib on Eliquis comes in with 1 day of melanotic tarry stool with associated coffee-ground appearing emesis that started 24 hours ago.  Patient denies ever having any epigastric abdominal pain.  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  Other than that he denies any use of Goody powders or BC powders frequently over-the-counter.  He occasionally gets heartburn which he takes Tums for but this is happened a couple times in the last 6 months.  He denies any fevers.  He does not have any history of previous GI bleed.  Patient referred for admission for GI bleed.  He has had a recent colonoscopy by GI but there is no results in the last year in epic.  His baseline hemoglobin is 14 today it is 10.  Patient be referred for admission for acute GI bleed.  Is been placed on Protonix.  Review of Systems: As per HPI otherwise 10 point review of systems negative.   Past Medical History:  Diagnosis Date  . Atrial fibrillation ([place])   . Essential hypertension 07/18/2009   Qualifier: Diagnosis of  By: [name], [name]    . Fatigue   . Hypertension   . KIDNEY CANCER 07/18/2009   s/p right neprectomy  . Moderate persistent asthma 07/18/2009       . Nasal polyposis 12/16/2016  . Onychomycosis 12/19/2014  . Pain in lower limb 12/19/2014  . Palpitations   . Perennial allergic rhinitis with nonallergic component 06/17/2016  . Sinusitis, maxillary, chronic 11/09/2012    Past Surgical History:  Procedure Laterality Date  . A-FLUTTER ABLATION N/A 05/08/2019   Procedure: A-FLUTTER ABLATION;  Surgeon: [name], MD;  Location: [facility];  Service: Cardiovascular;  Laterality: N/A;    . HEMORRHOID SURGERY    . right ankle surgery    . right hip replacement    . right kidney removed  2000   for renal ca  . SINOSCOPY       reports that he has never smoked. He has never used smokeless tobacco. He reports current alcohol use. He reports that he does not use drugs.  Allergies  Allergen Reactions  . Altace [Ramipril] Other (See Comments)    Patient is unaware of allergy  . Cozaar [Losartan Potassium] Other (See Comments)    Patient is unaware of allergy    Family History  Problem Relation Age of Onset  . Hypertension Mother   . Diabetes Mother   . Colon cancer Sister   . Lung disease Neg Hx   . Allergic rhinitis Neg Hx   . Angioedema Neg Hx   . Asthma Neg Hx   . Eczema Neg Hx   . Immunodeficiency Neg Hx   . Urticaria Neg Hx     Prior to Admission medications   Medication Sig Start Date End Date Taking? Authorizing Provider  amLODipine (NORVASC) 5 MG tablet Take 5 mg by mouth every evening.  11/05/18  Yes [provider]  apixaban (ELIQUIS) 5 MG TABS tablet Take 1 tablet (5 mg total) by mouth 2 (two) times daily. 03/10/19 03/09/20 Yes Turner, Eber Hong, MD  budesonide-formoterol (SYMBICORT) 160-4.5 MCG/ACT inhaler Inhale 2  puffs into the lungs 2 (two) times daily.   Yes [provider]  hydrALAZINE (APRESOLINE) 100 MG tablet Take 100 mg by mouth 3 (three) times daily.   Yes [provider]  hydrochlorothiazide (HYDRODIURIL) 12.5 MG tablet Take 12.5 mg by mouth every Monday, Wednesday, and Friday.    Yes [provider]  metoprolol tartrate (LOPRESSOR) 25 MG tablet Take 1.5 tablets (37.5 mg total) by mouth 2 (two) times daily. 06/05/19 10/17/28 Yes [name], MD    Physical Exam: Vitals:   10/18/19 0230 10/18/19 0245 10/18/19 0300 10/18/19 0317  BP: 120/63 117/65 113/64   Pulse: (!) 29 67 66   Resp: 15 16 11    Temp:      SpO2: 99% 100% 99% 100%      Constitutional: NAD, calm, comfortable Vitals:   10/18/19 0230  10/18/19 0245 10/18/19 0300 10/18/19 0317  BP: 120/63 117/65 113/64   Pulse: (!) 29 67 66   Resp: 15 16 11    Temp:      SpO2: 99% 100% 99% 100%   Eyes: PERRL, lids and conjunctivae normal ENMT: Mucous membranes are moist. Posterior pharynx clear of any exudate or lesions.Normal dentition.  Neck: normal, supple, no masses, no thyromegaly Respiratory: clear to auscultation bilaterally, no wheezing, no crackles. Normal respiratory effort. No accessory muscle use.  Cardiovascular: Regular rate and rhythm, no murmurs / rubs / gallops. No extremity edema. 2+ pedal pulses. No carotid bruits.  Abdomen: no tenderness, no masses palpated. No hepatosplenomegaly. Bowel sounds positive.  Musculoskeletal: no clubbing / cyanosis. No joint deformity upper and lower extremities. Good ROM, no contractures. Normal muscle tone.  Skin: no rashes, lesions, ulcers. No induration Neurologic: CN 2-12 grossly intact. Sensation intact, DTR normal. Strength 5/5 in all 4.  Psychiatric: Normal judgment and insight. Alert and oriented x 3. Normal mood.    Labs on Admission: I have personally reviewed following labs and imaging studies  CBC: Recent Labs  Lab 10/18/19 0107 10/18/19 0330  WBC 6.3  --   NEUTROABS 4.2  --   HGB 10.4* 10.1*  HCT 31.2* 29.6*  MCV 95.4  --   PLT 140*  --    Basic Metabolic Panel: Recent Labs  Lab 10/18/19 0107  NA 138  K 4.2  CL 102  CO2 28  GLUCOSE 154*  BUN 51*  CREATININE 2.01*  CALCIUM 8.7*   GFR: CrCl cannot be calculated (Unknown ideal weight.). Liver Function Tests: Recent Labs  Lab 10/18/19 0107  AST 26  ALT 22  ALKPHOS 45  BILITOT 0.6  PROT 6.5  ALBUMIN 3.3*   No results for input(s): LIPASE, AMYLASE in the last 168 hours. No results for input(s): AMMONIA in the last 168 hours. Coagulation Profile: Recent Labs  Lab 10/18/19 0107  INR 1.2   Cardiac Enzymes: No results for input(s): CKTOTAL, CKMB, CKMBINDEX, TROPONINI in the last 168 hours. BNP  (last 3 results) No results for input(s): PROBNP in the last 8760 hours. HbA1C: No results for input(s): HGBA1C in the last 72 hours. CBG: No results for input(s): GLUCAP in the last 168 hours. Lipid Profile: No results for input(s): CHOL, HDL, LDLCALC, TRIG, CHOLHDL, LDLDIRECT in the last 72 hours. Thyroid Function Tests: No results for input(s): TSH, T4TOTAL, FREET4, T3FREE, THYROIDAB in the last 72 hours. Anemia Panel: No results for input(s): VITAMINB12, FOLATE, FERRITIN, TIBC, IRON, RETICCTPCT in the last 72 hours. Urine analysis: No results found for: COLORURINE, APPEARANCEUR, [place], [place], [place], [place], [place], [place], [place],  UROBILINOGEN, NITRITE, LEUKOCYTESUR Sepsis Labs: !!!!!!!!!!!!!!!!!!!!!!!!!!!!!!!!!!!!!!!!!!!! @LABRCNTIP (procalcitonin:4,lacticidven:4) ) Recent Results (from the past 240 hour(s))  Respiratory Panel by RT PCR (Flu A&B, Covid) - Nasopharyngeal Swab     Status: None   Collection Time: 10/18/19  2:38 AM   Specimen: Nasopharyngeal Swab  Result Value Ref Range Status   SARS Coronavirus 2 by RT PCR NEGATIVE NEGATIVE Final    Comment: (NOTE) SARS-CoV-2 target nucleic acids are NOT DETECTED. The SARS-CoV-2 RNA is generally detectable in upper respiratoy specimens during the acute phase of infection. The lowest concentration of SARS-CoV-2 viral copies this assay can detect is 131 copies/mL. A negative result does not preclude SARS-Cov-2 infection and should not be used as the sole basis for treatment or other patient management decisions. A negative result may occur with  improper specimen collection/handling, submission of specimen other than nasopharyngeal swab, presence of viral mutation(s) within the areas targeted by this assay, and inadequate number of viral copies (<131 copies/mL). A negative result must be combined with clinical observations, patient history, and epidemiological information. The expected result is Negative. Fact  Sheet for Patients:  PinkCheek.be Fact Sheet for Healthcare Providers:  GravelBags.it This test is not yet ap proved or cleared by the [place] FDA and  has been authorized for detection and/or diagnosis of SARS-CoV-2 by FDA under an Emergency Use Authorization (EUA). This EUA will remain  in effect (meaning this test can be used) for the duration of the COVID-19 declaration under Section 564(b)(1) of the Act, 21 U.S.C. section 360bbb-3(b)(1), unless the authorization is terminated or revoked sooner.    Influenza A by PCR NEGATIVE NEGATIVE Final   Influenza B by PCR NEGATIVE NEGATIVE Final    Comment: (NOTE) The Xpert Xpress SARS-CoV-2/FLU/RSV assay is intended as an aid in  the diagnosis of influenza from Nasopharyngeal swab specimens and  should not be used as a sole basis for treatment. Nasal washings and  aspirates are unacceptable for Xpert Xpress SARS-CoV-2/FLU/RSV  testing. Fact Sheet for Patients: PinkCheek.be Fact Sheet for Healthcare Providers: GravelBags.it This test is not yet approved or cleared by the [place] FDA and  has been authorized for detection and/or diagnosis of SARS-CoV-2 by  FDA under an Emergency Use Authorization (EUA). This EUA will remain  in effect (meaning this test can be used) for the duration of the  Covid-19 declaration under Section 564(b)(1) of the Act, 21  U.S.C. section 360bbb-3(b)(1), unless the authorization is  terminated or revoked. Performed at [facility], [place] [address], [place], [place] [address]      Radiological Exams on Admission: No results found.  Old chart reviewed Case discussed with EDP  Assessment/Plan 74 year old male with upper GI bleed Principal Problem:   GIB (gastrointestinal bleeding)-placed on Protonix drip.  Consider calling GI in the morning to either arrange outpatient  follow-up if he remains stable or if patient bleeds any further for inpatient scoping.  Keep n.p.o. at this time.  Hold Eliquis.  Patient has had a 4 g drop in his hemoglobin.  Check every 8 hour H&H.  Vital signs have been stable.  Active Problems:   Essential hypertension-hold blood pressure meds at this time   Moderate persistent asthma-stable   Unspecified atrial flutter (HCC)-Eliquis on hold secondary to above   Chronic anticoagulation-holding Eliquis for above    DVT prophylaxis: SCDs Code Status: Full Family Communication: None Disposition Plan: 1 to 2 days if no further bleeding Consults called: Consider calling GI Admission status: Observation   Britini Garcilazo  A MD [facility]  If 7PM-7AM, please contact night-coverage [email or web address] Password [facility]  10/18/2019, 4:41 AM

## 2019-10-18 NOTE — ED Triage Notes (Signed)
Patient reports black stool and brown emesis this evening , he is taking anticoagulant for AFib , denies abdominal pain , no fever or chills.

## 2019-10-18 NOTE — ED Provider Notes (Signed)
[facility]ER[facility]ENC[facility] DEPARTMENT Provider Note   CSN: [id number] Arrival date & time: 10/18/19  0052     History Chief Complaint  Patient presents with  . GI Bleeding    Paul Kriz Sr. is a 74 y.o. male.  HPI     This is a 74 year old male with a history of atrial fibrillation on Eliquis, hypertension who presents with hematemesis and melena.  Patient reports he got home from work around 8 PM.  He felt very "bloated" but denies any abdominal pain.  He went to the bathroom and had a normal bowel movement.  He continued to have a bloating sensation and then became nauseous.  He had several episodes of emesis which he states were "dark brown".  However, he went to the bathroom and had a loose dark black stool.  At that time he felt dizzy and sweaty.  He states that he laid on the floor.  Upon arrival to the ED he was noted to have an episode of hematemesis.  No history of GI bleed in the past.  He denies any history of ulcers or diverticulosis.  Denies any history of significant NSAID use or daily alcohol use.  Reports recently having a colonoscopy done by Dr. Michail Sermon.  Past Medical History:  Diagnosis Date  . Atrial fibrillation ([place])   . Essential hypertension 07/18/2009   Qualifier: Diagnosis of  By: [name], [name]    . Fatigue   . Hypertension   . KIDNEY CANCER 07/18/2009   s/p right neprectomy  . Moderate persistent asthma 07/18/2009       . Nasal polyposis 12/16/2016  . Onychomycosis 12/19/2014  . Pain in lower limb 12/19/2014  . Palpitations   . Perennial allergic rhinitis with nonallergic component 06/17/2016  . Sinusitis, maxillary, chronic 11/09/2012    Patient Active Problem List   Diagnosis Date Noted  . Unspecified atrial flutter ([place]) 03/28/2019  . PAC (premature atrial contraction) 12/21/2018  . Nasal polyposis 12/16/2016  . Perennial allergic rhinitis with nonallergic component 06/17/2016  . Onychomycosis 12/19/2014  . Pain in lower limb  12/19/2014  . Sinusitis, maxillary, chronic 11/09/2012  . KIDNEY CANCER 07/18/2009  . Essential hypertension 07/18/2009  . Moderate persistent asthma 07/18/2009    Past Surgical History:  Procedure Laterality Date  . A-FLUTTER ABLATION N/A 05/08/2019   Procedure: A-FLUTTER ABLATION;  Surgeon: [name], MD;  Location: [facility];  Service: Cardiovascular;  Laterality: N/A;  . HEMORRHOID SURGERY    . right ankle surgery    . right hip replacement    . right kidney removed  2000   for renal ca  . SINOSCOPY         Family History  Problem Relation Age of Onset  . Hypertension Mother   . Diabetes Mother   . Colon cancer Sister   . Lung disease Neg Hx   . Allergic rhinitis Neg Hx   . Angioedema Neg Hx   . Asthma Neg Hx   . Eczema Neg Hx   . Immunodeficiency Neg Hx   . Urticaria Neg Hx     Social History   Tobacco Use  . Smoking status: Never Smoker  . Smokeless tobacco: Never Used  Substance Use Topics  . Alcohol use: Yes    Alcohol/week: 0.0 standard drinks    Comment: 1-2 drinks/month  . Drug use: No    Home Medications Prior to Admission medications   Medication Sig Start Date End Date Taking?  Authorizing Provider  amLODipine (NORVASC) 5 MG tablet Take 5 mg by mouth every evening.  11/05/18  Yes [provider]  apixaban (ELIQUIS) 5 MG TABS tablet Take 1 tablet (5 mg total) by mouth 2 (two) times daily. 03/10/19 03/09/20 Yes Turner, Eber Hong, MD  budesonide-formoterol (SYMBICORT) 160-4.5 MCG/ACT inhaler Inhale 2 puffs into the lungs 2 (two) times daily.   Yes [provider]  hydrALAZINE (APRESOLINE) 100 MG tablet Take 100 mg by mouth 3 (three) times daily.   Yes [provider]  hydrochlorothiazide (HYDRODIURIL) 12.5 MG tablet Take 12.5 mg by mouth every Monday, Wednesday, and Friday.    Yes [provider]  metoprolol tartrate (LOPRESSOR) 25 MG tablet Take 1.5 tablets (37.5 mg total) by mouth 2 (two) times daily. 06/05/19  10/17/28 Yes [name], MD    Allergies    Altace [ramipril] and Cozaar [losartan potassium]  Review of Systems   Review of Systems  Constitutional: Positive for diaphoresis. Negative for fever.  Respiratory: Negative for shortness of breath.   Cardiovascular: Negative for chest pain.  Gastrointestinal: Positive for blood in stool, nausea and vomiting. Negative for abdominal pain.  Genitourinary: Negative for dysuria.  Neurological: Positive for dizziness.  All other systems reviewed and are negative.   Physical Exam Updated Vital Signs BP 113/64   Pulse 66   Temp 98.2 F (36.8 C)   Resp 11   SpO2 100%   Physical Exam Vitals and nursing note reviewed.  Constitutional:      Appearance: He is well-developed. He is not ill-appearing.  HENT:     Head: Normocephalic and atraumatic.     Mouth/Throat:     Mouth: Mucous membranes are moist.  Eyes:     Pupils: Pupils are equal, round, and reactive to light.  Cardiovascular:     Rate and Rhythm: Normal rate and regular rhythm.     Heart sounds: Normal heart sounds. No murmur.  Pulmonary:     Effort: Pulmonary effort is normal. No respiratory distress.     Breath sounds: Normal breath sounds. No wheezing.  Abdominal:     General: Bowel sounds are normal.     Palpations: Abdomen is soft.     Tenderness: There is no abdominal tenderness. There is no rebound.  Genitourinary:    Rectum: Guaiac result positive.     Comments: Melena noted Musculoskeletal:     Cervical back: Neck supple.  Lymphadenopathy:     Cervical: No cervical adenopathy.  Skin:    General: Skin is warm and dry.  Neurological:     Mental Status: He is alert and oriented to person, place, and time.  Psychiatric:        Mood and Affect: Mood normal.     ED Results / Procedures / Treatments   Labs (all labs ordered are listed, but only abnormal results are displayed) Labs Reviewed  CBC WITH DIFFERENTIAL/PLATELET - Abnormal; Notable for the  following components:      Result Value   RBC 3.27 (*)    Hemoglobin 10.4 (*)    HCT 31.2 (*)    Platelets 140 (*)    All other components within normal limits  COMPREHENSIVE METABOLIC PANEL - Abnormal; Notable for the following components:   Glucose, Bld 154 (*)    BUN 51 (*)    Creatinine, Ser 2.01 (*)    Calcium 8.7 (*)    Albumin 3.3 (*)    GFR calc non Af Amer 32 (*)  GFR calc Af Amer 37 (*)    All other components within normal limits  HEMOGLOBIN AND HEMATOCRIT, BLOOD - Abnormal; Notable for the following components:   Hemoglobin 10.1 (*)    HCT 29.6 (*)    All other components within normal limits  POC OCCULT BLOOD, ED - Abnormal; Notable for the following components:   Fecal Occult Bld POSITIVE (*)    All other components within normal limits  RESPIRATORY PANEL BY RT PCR (FLU A&B, COVID)  PROTIME-INR  SAMPLE TO BLOOD BANK    EKG EKG Interpretation  Date/Time:  Wednesday October 18 2019 02:04:10 EST Ventricular Rate:  70 PR Interval:    QRS Duration: 114 QT Interval:  404 QTC Calculation: 436 R Axis:   -69 Text Interpretation: Sinus rhythm Ventricular premature complex Left anterior fascicular block Abnormal R-wave progression, early transition Left ventricular hypertrophy No significant change since last tracing Confirmed by Thayer Jew [id number]) on 10/18/2019 2:28:54 AM   Radiology No results found.  Procedures Procedures (including critical care time)  CRITICAL CARE Performed by: Merryl Hacker   Total critical care time: 31 minutes  Critical care time was exclusive of separately billable procedures and treating other patients.  Critical care was necessary to treat or prevent imminent or life-threatening deterioration.  Critical care was time spent personally by me on the following activities: development of treatment plan with patient and/or surrogate as well as nursing, discussions with consultants, evaluation of patient's response to  treatment, examination of patient, obtaining history from patient or surrogate, ordering and performing treatments and interventions, ordering and review of laboratory studies, ordering and review of radiographic studies, pulse oximetry and re-evaluation of patient's condition.   Medications Ordered in ED Medications  pantoprazole (PROTONIX) 80 mg in sodium chloride 0.9 % 250 mL (0.32 mg/mL) infusion (8 mg/hr Intravenous New Bag/Given 10/18/19 0330)  pantoprazole (PROTONIX) injection 40 mg (has no administration in time range)  ondansetron (ZOFRAN) injection 4 mg (4 mg Intravenous Given 10/18/19 0232)  pantoprazole (PROTONIX) 80 mg in sodium chloride 0.9 % 100 mL IVPB (0 mg Intravenous Stopped 10/18/19 0327)    ED Course  I have reviewed the triage vital signs and the nursing notes.  Pertinent labs & imaging results that were available during my care of the patient were reviewed by me and considered in my medical decision making (see chart for details).    MDM Rules/Calculators/A&P                       Patient presents with coffee-ground emesis and dark stools from home.  Was noted to have an episode of hematemesis in the waiting room.  He is nontoxic on my evaluation.  Vital signs are reassuring including blood pressure and heart rate.  He is on Eliquis.  Hemoccult is positive and he has noted melena on exam.  He has not had any ongoing stools or vomiting in the emergency room.  His initial hemoglobin is 10.4 down from 14 within the last year.  Patient was started on IV Protonix.  Given nature of bleeding, suspect upper GI bleed although he does have a history of colon polyps.  Repeat hemoglobin is 10.1.  Arguably stable.  However, given episode of hematemesis and anticoagulation, feel he warrants admission for observation and possible GI evaluation for endoscopy.    Final Clinical Impression(s) / ED Diagnoses Final diagnoses:  Acute GI bleeding    Rx / DC Orders ED Discharge Orders  None       Merryl Hacker, MD 10/18/19 [phone]

## 2019-10-18 NOTE — Consult Note (Signed)
[facility] Consultation Note  Referring Provider: [facility] Primary Care Physician:  [name], MD Primary Gastroenterologist:  Dr. Cannon Kettle  Reason for Consultation:  Blood in stool  HPI: Paul Brennen Sr. is a 74 y.o. male presenting with blood in stool.  Starting yesterday, began having generalized stomach upset, coffee ground emesis, and melena.  Is on apixaban, last dose yesterday.  Last bleeding several hours ago.  No abdominal pain, change in bowel habits, unintentional weight loss.  Had colonoscopy with polypectomy about 6 weeks ago.  No prior episodes of GI bleeding.  No prior endoscopy.  Been taking NSAIDs of late for joint swelling in hands.   Past Medical History:  Diagnosis Date  . Abnormal EKG   . Anemia   . Atrial fibrillation ([place])   . Essential hypertension 07/18/2009   Qualifier: Diagnosis of  By: [name], [name]    . Fatigue   . Hypertension   . KIDNEY CANCER 07/18/2009   s/p right neprectomy  . Moderate persistent asthma 07/18/2009       . Nasal polyposis 12/16/2016  . Onychomycosis 12/19/2014  . Pain in lower limb 12/19/2014  . Palpitations   . Perennial allergic rhinitis with nonallergic component 06/17/2016  . Sinusitis, maxillary, chronic 11/09/2012    Past Surgical History:  Procedure Laterality Date  . A-FLUTTER ABLATION N/A 05/08/2019   Procedure: A-FLUTTER ABLATION;  Surgeon: [name], MD;  Location: [facility];  Service: Cardiovascular;  Laterality: N/A;  . HEMORRHOID SURGERY    . right ankle surgery    . right hip replacement    . right kidney removed  2000   for renal ca  . SINOSCOPY      Prior to Admission medications   Medication Sig Start Date End Date Taking? Authorizing Provider  amLODipine (NORVASC) 5 MG tablet Take 5 mg by mouth every evening.  11/05/18  Yes [provider]  apixaban (ELIQUIS) 5 MG TABS tablet Take 1 tablet (5 mg total) by mouth 2 (two) times daily. 03/10/19 03/09/20  Yes Turner, Eber Hong, MD  budesonide-formoterol (SYMBICORT) 160-4.5 MCG/ACT inhaler Inhale 2 puffs into the lungs 2 (two) times daily.   Yes [provider]  hydrALAZINE (APRESOLINE) 100 MG tablet Take 100 mg by mouth 3 (three) times daily.   Yes [provider]  hydrochlorothiazide (HYDRODIURIL) 12.5 MG tablet Take 12.5 mg by mouth every Monday, Wednesday, and Friday.    Yes [provider]  metoprolol tartrate (LOPRESSOR) 25 MG tablet Take 1.5 tablets (37.5 mg total) by mouth 2 (two) times daily. 06/05/19 10/17/28 Yes [name], MD    Current Facility-Administered Medications  Medication Dose Route Frequency Provider Last Rate Last Admin  . 0.9 %  sodium chloride infusion  250 mL Intravenous PRN Phillips Grout, MD      . ondansetron [facility]) tablet 4 mg  4 mg Oral Q6H PRN Phillips Grout, MD       Or  . ondansetron (ZOFRAN) injection 4 mg  4 mg Intravenous Q6H PRN Phillips Grout, MD      . pantoprazole (PROTONIX) 80 mg in sodium chloride 0.9 % 250 mL (0.32 mg/mL) infusion  8 mg/hr Intravenous Continuous Horton, Barbette Hair, MD 25 mL/hr at 10/18/19 0330 8 mg/hr at 10/18/19 0330  . [START ON 10/21/2019] pantoprazole (PROTONIX) injection 40 mg  40 mg Intravenous Q12H Horton, Barbette Hair, MD      . sodium chloride flush (NS) 0.9 % injection  3 mL  3 mL Intravenous Q12H Derrill Kay A, MD   3 mL at 10/18/19 0910  . sodium chloride flush (NS) 0.9 % injection 3 mL  3 mL Intravenous PRN Phillips Grout, MD       Current Outpatient Medications  Medication Sig Dispense Refill  . amLODipine (NORVASC) 5 MG tablet Take 5 mg by mouth every evening.     Marland Kitchen apixaban (ELIQUIS) 5 MG TABS tablet Take 1 tablet (5 mg total) by mouth 2 (two) times daily. 180 tablet 3  . budesonide-formoterol (SYMBICORT) 160-4.5 MCG/ACT inhaler Inhale 2 puffs into the lungs 2 (two) times daily.    . hydrALAZINE (APRESOLINE) 100 MG tablet Take 100 mg by mouth 3 (three) times daily.    .  hydrochlorothiazide (HYDRODIURIL) 12.5 MG tablet Take 12.5 mg by mouth every Monday, Wednesday, and Friday.     . metoprolol tartrate (LOPRESSOR) 25 MG tablet Take 1.5 tablets (37.5 mg total) by mouth 2 (two) times daily. 270 tablet 3    Allergies as of 10/18/2019 - Review Complete 10/18/2019  Allergen Reaction Noted  . Altace [ramipril] Other (See Comments) 12/19/2018  . Cozaar [losartan potassium] Other (See Comments) 12/19/2018    Family History  Problem Relation Age of Onset  . Hypertension Mother   . Diabetes Mother   . Colon cancer Sister   . Lung disease Neg Hx   . Allergic rhinitis Neg Hx   . Angioedema Neg Hx   . Asthma Neg Hx   . Eczema Neg Hx   . Immunodeficiency Neg Hx   . Urticaria Neg Hx     Social History   Socioeconomic History  . Marital status: Married    Spouse name: Not on file  . Number of children: 3  . Years of education: Not on file  . Highest education level: Not on file  Occupational History  . Occupation: Retired    Comment: Patent attorney  Tobacco Use  . Smoking status: Never Smoker  . Smokeless tobacco: Never Used  Substance and Sexual Activity  . Alcohol use: Yes    Alcohol/week: 0.0 standard drinks    Comment: 1-2 drinks/month  . Drug use: No  . Sexual activity: Not on file  Other Topics Concern  . Not on file  Social History Narrative   Originally from [place]. Moved to [place] in 1996. Has also lived in [place] state. Has traveled to [place], [place], [place], [place], [place], [place], [place], [place], [place], [place], [place], [place], [place], [place], [place], [place], [place], [place], & [place]. 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 No pets currently. No bird, mold or hot tub exposure. Enjoys exercising regularly, fishing & traveling.    Social Determinants of Health   Financial Resource Strain:   . Difficulty of Paying Living Expenses: Not on file  Food  Insecurity:   . Worried About Charity fundraiser in the Last Year: Not on file  . Ran Out of Food in the Last Year: Not on file  Transportation Needs:   . Lack of Transportation (Medical): Not on file  . Lack of Transportation (Non-Medical): Not on file  Physical Activity:   . Days of Exercise per Week: Not on file  . Minutes of Exercise per Session: Not on file  Stress:   . Feeling of Stress : Not on file  Social Connections:   . Frequency of Communication with Friends  and Family: Not on file  . Frequency of Social Gatherings with Friends and Family: Not on file  . Attends Religious Services: Not on file  . Active Member of Clubs or Organizations: Not on file  . Attends Archivist Meetings: Not on file  . Marital Status: Not on file  Intimate Partner Violence:   . Fear of Current or Ex-Partner: Not on file  . Emotionally Abused: Not on file  . Physically Abused: Not on file  . Sexually Abused: Not on file    Review of Systems: as per hpi, all others negative  Physical Exam: Vital signs in last 24 hours: Temp:  [98.2 F (36.8 C)] 98.2 F (36.8 C) (01/13 0100) Pulse Rate:  [29-93] 43 (01/13 0945) Resp:  [11-26] 17 (01/13 0945) BP: (103-156)/(55-80) 115/60 (01/13 0945) SpO2:  [95 %-100 %] 95 % (01/13 0945)   General:   Alert,  Well-developed, well-nourished, pleasant and cooperative in NAD Head:  Normocephalic and atraumatic. Eyes:  Sclera clear, no icterus.   Conjunctiva pink. Ears:  Normal auditory acuity. Nose:  No deformity, discharge,  or lesions. Mouth:  No deformity or lesions.  Oropharynx pink & moist. Neck:  Supple; no masses or thyromegaly. Lungs:  Clear throughout to auscultation.   No wheezes, crackles, or rhonchi. No acute distress. Heart:  Irregularly irreegular rate and rhythm; no murmurs, clicks, rubs,  or gallops. Abdomen:  Soft, nontender and nondistended. No masses, hepatosplenomegaly or hernias noted. Normal bowel sounds, without guarding,  and without rebound.     Msk:  Symmetrical without gross deformities. Normal posture. Pulses:  Normal pulses noted. Extremities:  Without clubbing or edema. Neurologic:  Alert and  oriented x4;  grossly normal neurologically. Skin:  Intact without significant lesions or rashes. Psych:  Alert and cooperative. Normal mood and affect.   Lab Results: Recent Labs    10/18/19 0107 10/18/19 0330 10/18/19 0439  WBC 6.3  --   --   HGB 10.4* 10.1* 9.6*  HCT 31.2* 29.6*  --   PLT 140*  --   --    BMET Recent Labs    10/18/19 0107  NA 138  K 4.2  CL 102  CO2 28  GLUCOSE 154*  BUN 51*  CREATININE 2.01*  CALCIUM 8.7*   LFT Recent Labs    10/18/19 0107  PROT 6.5  ALBUMIN 3.3*  AST 26  ALT 22  ALKPHOS 45  BILITOT 0.6   PT/INR Recent Labs    10/18/19 0107  LABPROT 14.8  INR 1.2    Studies/Results: No results found.  Impression:  1.  Coffee ground emesis and melena. Normal hemodynamics.  Last episode several hours ago. In conjunction with NSAIDs, suspect peptic ulcer. 2.  Acute blood loss anemia. 3.  Chronic anticoagulation, apixaban.  Plan:  1.  Clear liquid diet, npo after midnight. 2.  Serial cbc's, PPI, transfuse as needed. 3.  Endoscopy tomorrow morning. 4.  Eliquis on hold. 5.  Risks (bleeding, infection, bowel perforation that could require surgery, sedation-related changes in cardiopulmonary systems), benefits (identification and possible treatment of source of symptoms, exclusion of certain causes of symptoms), and alternatives (watchful waiting, radiographic imaging studies, empiric medical treatment) of upper endoscopy (EGD) were explained to patient/family in detail and patient wishes to proceed. 6.  Next step in management pending endoscopy findings.   LOS: 0 days   Kimmarie Pascale M  10/18/2019, 10:13 AM  Cell [phone] If no answer or after 5 PM call [phone]

## 2019-10-18 NOTE — ED Notes (Signed)
Pts cardiac monitor shows ventricular trigeminy. EKG performed and exported to chart. MD notified. V/S stable, pt comfortable, denies pain. Will continue to monitor.

## 2019-10-19 ENCOUNTER — Inpatient Hospital Stay (HOSPITAL_COMMUNITY): Payer: Medicare HMO | Admitting: Certified Registered Nurse Anesthetist

## 2019-10-19 ENCOUNTER — Encounter (HOSPITAL_COMMUNITY)
Admission: EM | Disposition: A | Discharge status: Home / Self Care | Payer: Self-pay | Source: Home / Self Care | Attending: Internal Medicine

## 2019-10-19 ENCOUNTER — Encounter (HOSPITAL_COMMUNITY): Payer: Self-pay | Admitting: Internal Medicine

## 2019-10-19 HISTORY — PX: ESOPHAGOGASTRODUODENOSCOPY (EGD) WITH PROPOFOL: SHX5813

## 2019-10-19 LAB — HEMOGLOBIN
Hemoglobin: 9 g/dL — ABNORMAL LOW (ref 13.0–17.0)
Hemoglobin: 9.4 g/dL — ABNORMAL LOW (ref 13.0–17.0)

## 2019-10-19 SURGERY — ESOPHAGOGASTRODUODENOSCOPY (EGD) WITH PROPOFOL
Anesthesia: Monitor Anesthesia Care | Laterality: Left

## 2019-10-19 MED ORDER — APIXABAN 5 MG PO TABS: 5.0000 mg | ORAL_TABLET | Freq: Two times a day (BID) | ORAL | 0 refills | Status: DC

## 2019-10-19 MED ORDER — LIDOCAINE HCL (CARDIAC) PF 100 MG/5ML IV SOSY
PREFILLED_SYRINGE | INTRAVENOUS | Status: DC | PRN
  Administered 2019-10-19: 50 mg via INTRAVENOUS

## 2019-10-19 MED ORDER — PANTOPRAZOLE SODIUM 40 MG PO TBEC
40.0000 mg | DELAYED_RELEASE_TABLET | Freq: Two times a day (BID) | ORAL | Status: DC
  Administered 2019-10-19: 40 mg via ORAL
  Filled 2019-10-19: qty 1

## 2019-10-19 MED ORDER — SODIUM CHLORIDE 0.9 % IV SOLN: INTRAVENOUS | Status: DC

## 2019-10-19 MED ORDER — PHENYLEPHRINE HCL (PRESSORS) 10 MG/ML IV SOLN
INTRAVENOUS | Status: DC | PRN
  Administered 2019-10-19: 40 ug via INTRAVENOUS

## 2019-10-19 MED ORDER — PROPOFOL 500 MG/50ML IV EMUL
INTRAVENOUS | Status: DC | PRN
  Administered 2019-10-19: 75 ug/kg/min via INTRAVENOUS

## 2019-10-19 MED ORDER — DEXMEDETOMIDINE HCL 200 MCG/2ML IV SOLN
INTRAVENOUS | Status: DC | PRN
  Administered 2019-10-19 (×2): 8 ug via INTRAVENOUS

## 2019-10-19 MED ORDER — PANTOPRAZOLE SODIUM 40 MG PO TBEC: 40.0000 mg | DELAYED_RELEASE_TABLET | Freq: Two times a day (BID) | ORAL | 0 refills | Status: DC

## 2019-10-19 MED ORDER — PROPOFOL 10 MG/ML IV BOLUS
INTRAVENOUS | Status: DC | PRN
  Administered 2019-10-19: 10 mg via INTRAVENOUS
  Administered 2019-10-19 (×2): 20 mg via INTRAVENOUS

## 2019-10-19 SURGICAL SUPPLY — 15 items

## 2019-10-19 NOTE — Discharge Summary (Signed)
Physician Discharge Summary  Paul Siracusa Sr. BWG:[id number] DOB: January 07, 1946 DOA: 10/18/2019  PCP: [name], MD  Admit date: 10/18/2019 Discharge date: 10/19/2019  Admitted From: home Disposition:  home  Recommendations for Outpatient Follow-up:  1. Follow up with PCP in 1-2 weeks, follow-up with GI in 2 weeks and follow-up H. pylori antibody 2. Please obtain BMP/CBC in one week 3. Please follow up on the following pending results:  Home Health:no  Equipment/Devices: nono  Discharge Condition: Stable Code Status: full Diet recommendation: soft diet  Brief/Interim Summary: Per admitting 74 y.o. male with medical history significant of hypertension, A. fib on Eliquis comes in with 1 day of melanotic tarry stool with associated coffee-ground appearing emesis that started 24 hours ago.  Patient denies ever having any epigastric abdominal pain.  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  Other than that he denies any use of Goody powders or BC powders frequently over-the-counter.  He occasionally gets heartburn which he takes Tums for but this is happened a couple times in the last 6 months.  He denies any fevers.  He does not have any history of previous GI bleed.  Patient referred for admission for GI bleed.  He has had a recent colonoscopy by GI but there is no results in the last year in epic.  His baseline hemoglobin is 14 today it is 10.  Patient be referred for admission for acute GI bleed.  Is been placed on Protonix. Patient is admitted for for further management Seen by GI underwent EGD 1/14-have small hiatal hernia/Gastritis/ Non-bleeding gastric ulcer with no stigmata of bleeding/Normal duodenal bulb.  Advised PPI for 6 weeks twice daily then indefinitely, ordered H. pylori antibody IgG and hemoglobin and will need to be followed up as outpatient by GI.  Subjective:  Seen this morning just prior back from endoscopy. denies nausea  vomiting.  Assessment & Plan/Discharge Diagnoses: :  GI bleeding with melena hematemesis likely upper GI bleeding in the setting of Eliquis.  Continue PPI, appreciate GI input status post endoscopy today-noted to have small hiatal hernia/Gastritis/ Non-bleeding gastric ulcer with no stigmata of bleeding/Normal duodenal bulb, first portion of the duodenum and second portion of theduodenum.  Check H. pylori serology.  Start soft diet repeat H&H in the morning.  Continue PPI twice daily for 6 weeks then daily indefinitely. Followed up in the afternoon he was tolerating diet hemoglobin has improved.  He is adamant on going home today advised to follow-up with GI and PCP repeat CBC and BMP in a wk.  Acute blood loss anemia due to #1.  Hemoglobin has stabilized.   Recent Labs  Lab 10/18/19 0107 10/18/19 0330 10/18/19 0439 10/18/19 1713 10/19/19 0444  HGB 10.4* 10.1* 9.6* 9.8* 9.0*--> 9.4 gm on repeat 15:36 10/19/19  HCT 31.2* 29.6*  --   --   --    Abnormal EKG/PVC/ventricular trigeminy : History of same as well as a flutter with ablation in the past.  Continue metoprolol .  Essential hypertension blood pressure controlled.  Home meds on hold which includes alodipine, apresoline, HCTZ and metoprolol.  Moderate persistent asthma is stable  CKD stage III history of kidney cancer status post right nephrectomy.  Creatinine 2.0 BUN 51.  Follow-up BMP in a week from PCP Recent Labs  Lab 10/18/19 0107  BUN 51*  CREATININE 2.01*   Body mass index is 30.34 kg/m.   DVT prophylaxis: SCD Code Status: Full Code  Family Communication: plan of care discussed with patient at bedside. Disposition Plan: Discharge once tolerates diet and labs are drawn and if hemoglobin is stable.  Is adamant on going home today.  Hemoglobin has improved.  Antibodies for h pylori pending and will be followed up as outpatient.  Consultants: Gastroenterology Procedures: EGD Finding - Small hiatal hernia - Gastritis. -  Non-bleeding gastric ulcer with no stigmata of bleeding. - Normal duodenal bulb, first portion of the duodenum and second portion of the duodenum. None recommendsations: - Return patient to hospital ward for ongoing care. - Soft diet today. - Resume Eliquis (apixaban) at prior dose in 1 week. - Use Protonix (pantoprazole) 40 mg PO BID for 6 weeks, then 40 mg po qd thereafter indefinitely (patient will need to be on PPI from here on out without cessation). - Check H. pylori serologies. [facility] will sign-off; we will arrange outpatient follow-up with [place] next several weeks; please call with any questions; thank you for the consultation.  Discharge Exam: Vitals:   10/19/19 1124 10/19/19 1221  BP: 112/66 122/75  Pulse: 65 (!) 58  Resp: 16 18  Temp:  97.8 F (36.6 C)  SpO2: 100% 100%   General: Pt is alert, awake, not in acute distress Cardiovascular: RRR, S1/S2 +, no rubs, no gallops Respiratory: CTA bilaterally, no wheezing, no rhonchi Abdominal: Soft, NT, ND, bowel sounds + Extremities: no edema, no cyanosis  Discharge Instructions  Discharge Instructions    Diet - low sodium heart healthy   Complete by: As directed    Soft diet   Discharge instructions   Complete by: As directed    follow-up with GI in 2 weeks and follow-up H. pylori antibody  Hold Eliquis for 1 week then resume  Please call call MD or return to ER for similar or worsening recurring problem that brought you to hospital or if any fever,nausea/vomiting,abdominal pain, uncontrolled pain, chest pain,  shortness of breath or any other alarming symptoms.  Please follow-up your doctor as instructed in a week time and call the office for appointment.  Please avoid alcohol, smoking, or any other illicit substance and maintain healthy habits including taking your regular medications as prescribed.  You were cared for by a hospitalist during your hospital stay. If you have any questions about your discharge  medications or the care you received while you were in the hospital after you are discharged, you can call the unit and ask to speak with the hospitalist on call if the hospitalist that took care of you is not available.  Once you are discharged, your primary care physician will handle any further medical issues. Please note that NO REFILLS for any discharge medications will be authorized once you are discharged, as it is imperative that you return to your primary care physician (or establish a relationship with a primary care physician if you do not have one) for your aftercare needs so that they can reassess your need for medications and monitor your lab values   Increase activity slowly   Complete by: As directed      Allergies as of 10/19/2019      Reactions   Altace [ramipril] Other (See Comments)   Patient is unaware of allergy   Cozaar [losartan Potassium] Other (See Comments)   Patient is unaware of allergy      Medication List    TAKE these medications   amLODipine 5 MG tablet Commonly known as: NORVASC Take 5 mg by mouth every  evening.   apixaban 5 MG Tabs tablet Commonly known as: ELIQUIS Take 1 tablet (5 mg total) by mouth 2 (two) times daily for 1 day. Hold Eliquis for 1 week due to GI bleeding Start taking on: October 25, 2019 What changed:   additional instructions  These instructions start on October 25, 2019. If you are unsure what to do until then, ask your doctor or other care provider.   budesonide-formoterol 160-4.5 MCG/ACT inhaler Commonly known as: SYMBICORT Inhale 2 puffs into the lungs 2 (two) times daily.   hydrALAZINE 100 MG tablet Commonly known as: APRESOLINE Take 100 mg by mouth 3 (three) times daily.   hydrochlorothiazide 12.5 MG tablet Commonly known as: HYDRODIURIL Take 12.5 mg by mouth every Monday, Wednesday, and Friday.   metoprolol tartrate 25 MG tablet Commonly known as: LOPRESSOR Take 1.5 tablets (37.5 mg total) by mouth 2 (two)  times daily.   pantoprazole 40 MG tablet Commonly known as: PROTONIX Take 1 tablet (40 mg total) by mouth 2 (two) times daily before a meal. Start taking on: October 20, 2019      Follow-up Information    [name], MD Follow up in 1 week(s).   Specialty: Family Medicine Contact information: [address] [place] [address] [phone]        Sueanne Margarita, MD .   Specialty: Cardiology Contact information: [phone] [address] [place] [address] [phone]        [name], MD Follow up in 2 week(s).   Specialty: Gastroenterology Why: follow-up with GI in 2 weeks and follow-up H. pylori antibody  Contact information: [address] [place] [address] [phone]          Allergies  Allergen Reactions  . Altace [Ramipril] Other (See Comments)    Patient is unaware of allergy  . Cozaar [Losartan Potassium] Other (See Comments)    Patient is unaware of allergy    The results of significant diagnostics from this hospitalization (including imaging, microbiology, ancillary and laboratory) are listed below for reference.    Microbiology: Recent Results (from the past 240 hour(s))  Respiratory Panel by RT PCR (Flu A&B, Covid) - Nasopharyngeal Swab     Status: None   Collection Time: 10/18/19  2:38 AM   Specimen: Nasopharyngeal Swab  Result Value Ref Range Status   SARS Coronavirus 2 by RT PCR NEGATIVE NEGATIVE Final    Comment: (NOTE) SARS-CoV-2 target nucleic acids are NOT DETECTED. The SARS-CoV-2 RNA is generally detectable in upper respiratoy specimens during the acute phase of infection. The lowest concentration of SARS-CoV-2 viral copies this assay can detect is 131 copies/mL. A negative result does not preclude SARS-Cov-2 infection and should not be used as the sole basis for treatment or other patient management decisions. A negative result may occur with  improper specimen collection/handling,  submission of specimen other than nasopharyngeal swab, presence of viral mutation(s) within the areas targeted by this assay, and inadequate number of viral copies (<131 copies/mL). A negative result must be combined with clinical observations, patient history, and epidemiological information. The expected result is Negative. Fact Sheet for Patients:  PinkCheek.be Fact Sheet for Healthcare Providers:  GravelBags.it This test is not yet ap proved or cleared by the [place] FDA and  has been authorized for detection and/or diagnosis of SARS-CoV-2 by FDA under an Emergency Use Authorization (EUA). This EUA will remain  in effect (meaning this test can be used) for the  duration of the COVID-19 declaration under Section 564(b)(1) of the Act, 21 U.S.C. section 360bbb-3(b)(1), unless the authorization is terminated or revoked sooner.    Influenza A by PCR NEGATIVE NEGATIVE Final   Influenza B by PCR NEGATIVE NEGATIVE Final    Comment: (NOTE) The Xpert Xpress SARS-CoV-2/FLU/RSV assay is intended as an aid in  the diagnosis of influenza from Nasopharyngeal swab specimens and  should not be used as a sole basis for treatment. Nasal washings and  aspirates are unacceptable for Xpert Xpress SARS-CoV-2/FLU/RSV  testing. Fact Sheet for Patients: PinkCheek.be Fact Sheet for Healthcare Providers: GravelBags.it This test is not yet approved or cleared by the [place] FDA and  has been authorized for detection and/or diagnosis of SARS-CoV-2 by  FDA under an Emergency Use Authorization (EUA). This EUA will remain  in effect (meaning this test can be used) for the duration of the  Covid-19 declaration under Section 564(b)(1) of the Act, 21  U.S.C. section 360bbb-3(b)(1), unless the authorization is  terminated or revoked. Performed at [facility], [place] [address]  [place], [place] [address]     Procedures/Studies: No results found.  Labs: BNP (last 3 results) No results for input(s): BNP in the last 8760 hours. Basic Metabolic Panel: Recent Labs  Lab 10/18/19 0107  NA 138  K 4.2  CL 102  CO2 28  GLUCOSE 154*  BUN 51*  CREATININE 2.01*  CALCIUM 8.7*   Liver Function Tests: Recent Labs  Lab 10/18/19 0107  AST 26  ALT 22  ALKPHOS 45  BILITOT 0.6  PROT 6.5  ALBUMIN 3.3*   No results for input(s): LIPASE, AMYLASE in the last 168 hours. No results for input(s): AMMONIA in the last 168 hours. CBC: Recent Labs  Lab 10/18/19 0107 10/18/19 0107 10/18/19 0330 10/18/19 0439 10/18/19 1713 10/19/19 0444 10/19/19 1536  WBC 6.3  --   --   --   --   --   --   NEUTROABS 4.2  --   --   --   --   --   --   HGB 10.4*   < > 10.1* 9.6* 9.8* 9.0* 9.4*  HCT 31.2*  --  29.6*  --   --   --   --   MCV 95.4  --   --   --   --   --   --   PLT 140*  --   --   --   --   --   --    < > = values in this interval not displayed.   Cardiac Enzymes: No results for input(s): CKTOTAL, CKMB, CKMBINDEX, TROPONINI in the last 168 hours. BNP: Invalid input(s): POCBNP CBG: No results for input(s): GLUCAP in the last 168 hours. D-Dimer No results for input(s): DDIMER in the last 72 hours. Hgb A1c No results for input(s): HGBA1C in the last 72 hours. Lipid Profile No results for input(s): CHOL, HDL, LDLCALC, TRIG, CHOLHDL, LDLDIRECT in the last 72 hours. Thyroid function studies No results for input(s): TSH, T4TOTAL, T3FREE, THYROIDAB in the last 72 hours.  Invalid input(s): FREET3 Anemia work up No results for input(s): VITAMINB12, FOLATE, FERRITIN, TIBC, IRON, RETICCTPCT in the last 72 hours. Urinalysis No results found for: COLORURINE, APPEARANCEUR, [place], [place], GLUCOSEU, [place], [place], KETONESUR, PROTEINUR, UROBILINOGEN, NITRITE, LEUKOCYTESUR Sepsis Labs Invalid input(s): PROCALCITONIN,  WBC,  LACTICIDVEN Microbiology Recent Results (from  the past 240 hour(s))  Respiratory Panel by RT PCR (Flu A&B, Covid) - Nasopharyngeal  Swab     Status: None   Collection Time: 10/18/19  2:38 AM   Specimen: Nasopharyngeal Swab  Result Value Ref Range Status   SARS Coronavirus 2 by RT PCR NEGATIVE NEGATIVE Final    Comment: (NOTE) SARS-CoV-2 target nucleic acids are NOT DETECTED. The SARS-CoV-2 RNA is generally detectable in upper respiratoy specimens during the acute phase of infection. The lowest concentration of SARS-CoV-2 viral copies this assay can detect is 131 copies/mL. A negative result does not preclude SARS-Cov-2 infection and should not be used as the sole basis for treatment or other patient management decisions. A negative result may occur with  improper specimen collection/handling, submission of specimen other than nasopharyngeal swab, presence of viral mutation(s) within the areas targeted by this assay, and inadequate number of viral copies (<131 copies/mL). A negative result must be combined with clinical observations, patient history, and epidemiological information. The expected result is Negative. Fact Sheet for Patients:  PinkCheek.be Fact Sheet for Healthcare Providers:  GravelBags.it This test is not yet ap proved or cleared by the [place] FDA and  has been authorized for detection and/or diagnosis of SARS-CoV-2 by FDA under an Emergency Use Authorization (EUA). This EUA will remain  in effect (meaning this test can be used) for the duration of the COVID-19 declaration under Section 564(b)(1) of the Act, 21 U.S.C. section 360bbb-3(b)(1), unless the authorization is terminated or revoked sooner.    Influenza A by PCR NEGATIVE NEGATIVE Final   Influenza B by PCR NEGATIVE NEGATIVE Final    Comment: (NOTE) The Xpert Xpress SARS-CoV-2/FLU/RSV assay is intended as an aid in  the diagnosis of influenza from Nasopharyngeal swab specimens and  should  not be used as a sole basis for treatment. Nasal washings and  aspirates are unacceptable for Xpert Xpress SARS-CoV-2/FLU/RSV  testing. Fact Sheet for Patients: PinkCheek.be Fact Sheet for Healthcare Providers: GravelBags.it This test is not yet approved or cleared by the [place] FDA and  has been authorized for detection and/or diagnosis of SARS-CoV-2 by  FDA under an Emergency Use Authorization (EUA). This EUA will remain  in effect (meaning this test can be used) for the duration of the  Covid-19 declaration under Section 564(b)(1) of the Act, 21  U.S.C. section 360bbb-3(b)(1), unless the authorization is  terminated or revoked. Performed at [facility], [place] [address] [place], [place] [address]      Time coordinating discharge: 25 minutes  SIGNED: Antonieta Pert, MD  [facility] 10/19/2019, 4:31 PM  If 7PM-7AM, please contact night-coverage [email or web address]

## 2019-10-19 NOTE — Anesthesia Procedure Notes (Signed)
Procedure Name: MAC Date/Time: 10/19/2019 10:35 AM Performed by: Mariea Clonts, CRNA Pre-anesthesia Checklist: Patient identified, Emergency Drugs available, Suction available, Patient being monitored and Timeout performed Patient Re-evaluated:Patient Re-evaluated prior to induction Oxygen Delivery Method: Nasal cannula

## 2019-10-19 NOTE — Anesthesia Preprocedure Evaluation (Signed)
Anesthesia Evaluation  Patient identified by MRN, date of birth, ID band Patient awake    Reviewed: Allergy & Precautions, NPO status , Patient's Chart, lab work & pertinent test results, reviewed documented beta blocker date and time   Airway Mallampati: I       Dental no notable dental hx. (+) Teeth Intact   Pulmonary asthma ,    Pulmonary exam normal breath sounds clear to auscultation       Cardiovascular hypertension, Pt. on medications and Pt. on home beta blockers Normal cardiovascular exam Rhythm:Regular Rate:Normal     Neuro/Psych negative neurological ROS     GI/Hepatic   Endo/Other    Renal/GU Renal diseaseCKD III  negative genitourinary   Musculoskeletal   Abdominal Normal abdominal exam  (+)   Peds  Hematology  (+) anemia ,   Anesthesia Other Findings   Reproductive/Obstetrics                             Anesthesia Physical Anesthesia Plan  ASA: II  Anesthesia Plan: MAC   Post-op Pain Management:    Induction:   PONV Risk Score and Plan: Propofol infusion  Airway Management Planned: Natural Airway and Mask  Additional Equipment: None  Intra-op Plan:   Post-operative Plan:   Informed Consent: I have reviewed the patients History and Physical, chart, labs and discussed the procedure including the risks, benefits and alternatives for the proposed anesthesia with the patient or authorized representative who has indicated his/her understanding and acceptance.     Dental advisory given  Plan Discussed with: CRNA  Anesthesia Plan Comments:         Anesthesia Quick Evaluation

## 2019-10-19 NOTE — Op Note (Signed)
[facility] Patient Name: Paul Roach Procedure Date : 10/19/2019 MRN: [id number] Attending MD: [name] , MD Date of Birth: 31-May-1946 CSN: [id number] Age: 74 Admit Type: Inpatient Procedure:                Upper GI endoscopy Indications:              Acute post hemorrhagic anemia, Coffee-ground                            emesis, Melena Providers:                [name], MD, Angus Seller, Jarick Harkins Dalton, Technician, Theodora Blow, Technician Referring MD:             [facility] Medicines:                Monitored Anesthesia Care Complications:            No immediate complications. Estimated Blood Loss:     Estimated blood loss: none. Procedure:                Pre-Anesthesia Assessment:                           - Prior to the procedure, a History and Physical                            was performed, and patient medications and                            allergies were reviewed. The patient's tolerance of                            previous anesthesia was also reviewed. The risks                            and benefits of the procedure and the sedation                            options and risks were discussed with the patient.                            All questions were answered, and informed consent                            was obtained. Prior Anticoagulants: The patient has                            taken Eliquis (apixaban), last dose was 2 days                            prior to procedure. ASA Grade Assessment: III - A  patient with severe systemic disease. After                            reviewing the risks and benefits, the patient was                            deemed in satisfactory condition to undergo the                            procedure.                           After obtaining informed consent, the endoscope was                            passed under direct vision.  Throughout the                            procedure, the patient's blood pressure, pulse, and                            oxygen saturations were monitored continuously. The                            [id number]-[id number] ([id number]) Olympus gastroscope was                            introduced through the mouth, and advanced to the                            second part of duodenum. The upper GI endoscopy was                            accomplished without difficulty. The patient                            tolerated the procedure well. Scope In: Scope Out: Findings:      A small hiatal hernia was present.      The exam of the esophagus was otherwise normal.      Patchy mild inflammation was found in the prepyloric region of the       stomach.      One non-bleeding cratered gastric ulcer with no stigmata of bleeding was       found in the gastric antrum. The lesion was 6 mm in largest dimension.      The duodenal bulb, first portion of the duodenum and second portion of       the duodenum were normal.      No old or fresh blood was seen to the extent of our examination. Impression:               - Small hiatal hernia.                           - Gastritis.                           -  Non-bleeding gastric ulcer with no stigmata of                            bleeding.                           - Normal duodenal bulb, first portion of the                            duodenum and second portion of the duodenum. Moderate Sedation:      None Recommendation:           - Return patient to hospital ward for ongoing care.                           - Soft diet today.                           - Resume Eliquis (apixaban) at prior dose in 1 week.                           - Use Protonix (pantoprazole) 40 mg PO BID for 6                            weeks, then 40 mg po qd thereafter indefinitely                            (patient will need to be on PPI from here on out                            without  cessation).                           - Check H. pylori serologies.                           - [facility] will sign-off; we will arrange                            outpatient follow-up with [place] next several weeks;                            please call with any questions; thank you for the                            consultation.                           - Lesion low risk for rebleeding; ok from GI                            perspective to discharge home later today or in AM. Procedure Code(s):        --- Professional ---  35009, Esophagogastroduodenoscopy, flexible,                            transoral; diagnostic, including collection of                            specimen(s) by brushing or washing, when performed                            (separate procedure) Diagnosis Code(s):        --- Professional ---                           K44.9, Diaphragmatic hernia without obstruction or                            gangrene                           K29.70, Gastritis, unspecified, without bleeding                           K25.9, Gastric ulcer, unspecified as acute or                            chronic, without hemorrhage or perforation                           D62, Acute posthemorrhagic anemia                           K92.0, Hematemesis                           K92.1, Melena (includes Hematochezia) CPT copyright 2019 American Medical Association. All rights reserved. The codes documented in this report are preliminary and upon coder review may  be revised to meet current compliance requirements. [name], MD 10/19/2019 11:20:30 AM This report has been signed electronically. Number of Addenda: 0

## 2019-10-19 NOTE — Progress Notes (Signed)
RN gave pt discharge instructions and pt stated understanding, IV has been removed. Pt has 1 new prescription that has been escribed to home pharmacy. Belongings packed.

## 2019-10-19 NOTE — Transfer of Care (Signed)
Immediate Anesthesia Transfer of Care Note  Patient: Paul Hangartner Sr.  Procedure(s) Performed: ESOPHAGOGASTRODUODENOSCOPY (EGD) WITH PROPOFOL (Left )  Patient Location: PACU  Anesthesia Type:MAC  Level of Consciousness: awake, alert  and oriented  Airway & Oxygen Therapy: Patient Spontanous Breathing and Patient connected to nasal cannula oxygen  Post-op Assessment: Report given to RN, Post -op Vital signs reviewed and stable and Patient moving all extremities X 4  Post vital signs: Reviewed and stable  Last Vitals:  Vitals Value Taken Time  BP 101/46 10/19/19 1103  Temp    Pulse 50 10/19/19 1107  Resp 6 10/19/19 1107  SpO2 100 % 10/19/19 1107  Vitals shown include unvalidated device data.  Last Pain:  Vitals:   10/19/19 0945  TempSrc: Oral  PainSc: 0-No pain         Complications: No apparent anesthesia complications

## 2019-10-19 NOTE — Interval H&P Note (Signed)
History and Physical Interval Note:  10/19/2019 10:20 AM  Paul Kitchens Sr.  has presented today for surgery, with the diagnosis of melena, coffee ground emesis, anemia.  The various methods of treatment have been discussed with the patient and family. After consideration of risks, benefits and other options for treatment, the patient has consented to  Procedure(s): ESOPHAGOGASTRODUODENOSCOPY (EGD) WITH PROPOFOL (Left) as a surgical intervention.  The patient's history has been reviewed, patient examined, no change in status, stable for surgery.  I have reviewed the patient's chart and labs.  Questions were answered to the patient's satisfaction.     Paul Roach  Assessment:   Melena, coffee ground emesis. Acute blood loss anemia Chronic anticoagulation (apixaban, on hold x 2 days).  Plan:   Endoscopy. Risks (bleeding, infection, bowel perforation that could require surgery, sedation-related changes in cardiopulmonary systems), benefits (identification and possible treatment of source of symptoms, exclusion of certain causes of symptoms), and alternatives (watchful waiting, radiographic imaging studies, empiric medical treatment) of upper endoscopy (EGD) were explained to patient/family in detail and patient wishes to proceed.

## 2019-10-23 ENCOUNTER — Telehealth: Payer: Self-pay | Admitting: Cardiology

## 2019-10-23 NOTE — Anesthesia Postprocedure Evaluation (Signed)
Anesthesia Post Note  Patient: Paul Roach.  Procedure(s) Performed: ESOPHAGOGASTRODUODENOSCOPY (EGD) WITH PROPOFOL (Left )     Patient location during evaluation: Endoscopy Anesthesia Type: MAC Level of consciousness: awake Pain management: pain level controlled Vital Signs Assessment: post-procedure vital signs reviewed and stable Respiratory status: spontaneous breathing Cardiovascular status: stable Postop Assessment: no apparent nausea or vomiting Anesthetic complications: no    Last Vitals:  Vitals:   10/19/19 1124 10/19/19 1221  BP: 112/66 122/75  Pulse: 65 (!) 58  Resp: 16 18  Temp:  36.6 C  SpO2: 100% 100%    Last Pain:  Vitals:   10/19/19 1221  TempSrc: Oral  PainSc:    Pain Goal:                   Huston Foley

## 2019-10-23 NOTE — Telephone Encounter (Signed)
Per pt call stated he would like to know why he needs a follow up apptointment after the Procedure. Pt requesting a call back please.

## 2019-10-23 NOTE — Telephone Encounter (Signed)
Returned call to pt and he has been scheduled to see Dr. Radford Pax 10/27/2019.

## 2019-10-24 LAB — H. PYLORI ANTIBODY, IGG: H Pylori IgG: 5.27 Index Value — ABNORMAL HIGH (ref 0.00–0.79)

## 2019-10-26 NOTE — Progress Notes (Signed)
Cardiology Office Note:    Date:  10/27/2019   ID:  Paul Kitchens Sr., DOB 1946/06/03, MRN [id number]  PCP:  Paul Cruel, MD  Cardiologist:  Paul Him, MD    Referring MD: Paul Cruel, MD   Chief Complaint  Patient presents with  . Atrial Flutter  . Hypertension    History of Present Illness:    Paul Trebilcock Sr. is a 74 y.o. 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  Initially referred to me for palpitations and concern for PAF.  Event monitor showed PACs, blocked PACs, nonsustained atrial tachycardia up to 23 beats and VT up to 12 beats.  2D echo showed low normal LVF with EF 50-55% with mild LVH, mld RVE with mildly reduced RVF and possible mass in IVC.  VQ scan showed no PE.  Complete abdominal US showed normal IVC.  Nuclear stress test showed no ischemia.  At the time of his echo he was noted to be in atrial flutter with CVR and was started on Eliquis.  He was seen by EP and underwent aflutter ablation by Dr. Lovena Le 05/2019.    He is here today for followup and is doing well. He was recently hospitalized for an UGI bleed with endoscopy showing small HH/gastritis and non bleeding gastric ulcer. It was felt by GI that his ulcer had low risk for further bleeding and we was placed back on Eliquis.  He was placed on PPI as well.  He denies any chest pain or pressure,  PND, orthopnea, LE edema, dizziness, palpitations or syncope. He occasionally has DOE with walking up hills. He is compliant with his meds and is tolerating meds with no SE.    Past Medical History:  Diagnosis Date  . Abnormal EKG   . Anemia   . Atrial fibrillation ([place])   . CKD (chronic kidney disease), stage III   . Essential hypertension 07/18/2009   Qualifier: Diagnosis of  By: Paul Roach, Paul Roach    . Fatigue   . Hypertension   . KIDNEY CANCER 07/18/2009   s/p right neprectomy  . Moderate persistent asthma 07/18/2009       . Nasal polyposis  12/16/2016  . Onychomycosis 12/19/2014  . Pain in lower limb 12/19/2014  . Palpitations   . Perennial allergic rhinitis with nonallergic component 06/17/2016  . PVC (premature ventricular contraction)   . Sinusitis, maxillary, chronic 11/09/2012    Past Surgical History:  Procedure Laterality Date  . A-FLUTTER ABLATION N/A 05/08/2019   Procedure: A-FLUTTER ABLATION;  Surgeon: Paul Lance, MD;  Location: [facility];  Service: Cardiovascular;  Laterality: N/A;  . ESOPHAGOGASTRODUODENOSCOPY (EGD) WITH PROPOFOL Left 10/19/2019   Procedure: ESOPHAGOGASTRODUODENOSCOPY (EGD) WITH PROPOFOL;  Surgeon: Paul Silence, MD;  Location: [facility] ENDOSCOPY;  Service: Endoscopy;  Laterality: Left;  . HEMORRHOID SURGERY    . right ankle surgery    . right hip replacement    . right kidney removed  2000   for renal ca  . SINOSCOPY      Current Medications: Current Meds  Medication Sig  . apixaban (ELIQUIS) 5 MG TABS tablet Take 5 mg by mouth 2 (two) times daily.     Allergies:   Altace [ramipril] and Cozaar [losartan potassium]   Social History   Socioeconomic History  . Marital status: Married    Spouse name: Not on file  . Number of children: 3  . Years of  education: Not on file  . Highest education level: Not on file  Occupational History  . Occupation: Retired    Comment: Patent attorney  Tobacco Use  . Smoking status: Never Smoker  . Smokeless tobacco: Never Used  Substance and Sexual Activity  . Alcohol use: Yes    Alcohol/week: 0.0 standard drinks    Comment: 1-2 drinks/month  . Drug use: No  . Sexual activity: Not on file  Other Topics Concern  . Not on file  Social History Narrative   Originally from [place]. Moved to [place] in 1996. Has also lived in [place] state. Has traveled to [place], [place], [place], [place], [place], [place], [place], [place], [place], [place], [place], [place], [place], [place], [place], [place], [place], [place], & [place]. 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 No pets currently. No bird, mold or hot tub exposure. Enjoys exercising regularly, fishing & traveling.    Social Determinants of Health   Financial Resource Strain:   . Difficulty of Paying Living Expenses: Not on file  Food Insecurity:   . Worried About Charity fundraiser in the Last Year: Not on file  . Ran Out of Food in the Last Year: Not on file  Transportation Needs:   . Lack of Transportation (Medical): Not on file  . Lack of Transportation (Non-Medical): Not on file  Physical Activity:   . Days of Exercise per Week: Not on file  . Minutes of Exercise per Session: Not on file  Stress:   . Feeling of Stress : Not on file  Social Connections:   . Frequency of Communication with Friends and Family: Not on file  . Frequency of Social Gatherings with Friends and Family: Not on file  . Attends Religious Services: Not on file  . Active Member of Clubs or Organizations: Not on file  . Attends Archivist Meetings: Not on file  . Marital Status: Not on file     Family History: The patient's family history includes Colon cancer in his sister; Diabetes in his mother; Hypertension in his mother. There is no history of Lung disease, Allergic rhinitis, Angioedema, Asthma, Eczema, Immunodeficiency, or Urticaria.  ROS:   Please see the history of present illness.    ROS  All other systems reviewed and negative.   EKGs/Labs/Other Studies Reviewed:    The following studies were reviewed today: EP notes, labs, 2D echo, VQ scan, stress test, abdominal US  EKG:  EKG is ordered today and showed NSR with PVCs and PACs and LAFB  Recent Labs: 10/18/2019: ALT 22; BUN 51; Creatinine, Ser 2.01; Platelets 140; Potassium 4.2; Sodium 138 10/19/2019: Hemoglobin 9.4   Recent Lipid Panel No results found for: CHOL, TRIG, HDL, CHOLHDL, VLDL, LDLCALC, LDLDIRECT  Physical Exam:    VS:  BP 140/72   Pulse  (!) 50   Ht 6\' 1"  (1.854 m)   Wt 233 lb 12.8 oz (106.1 kg)   SpO2 99%   BMI 30.85 kg/m     Wt Readings from Last 3 Encounters:  10/27/19 233 lb 12.8 oz (106.1 kg)  10/19/19 230 lb (104.3 kg)  06/05/19 230 lb 12.8 oz (104.7 kg)     GEN:  Well nourished, well developed in no acute distress HEENT: Normal NECK: No JVD; No carotid bruits LYMPHATICS: No lymphadenopathy CARDIAC: Irregularly irregular, no murmurs, rubs, gallops RESPIRATORY:  Clear to auscultation without rales, wheezing or rhonchi  ABDOMEN: Soft, non-tender, non-distended MUSCULOSKELETAL:  No edema; No deformity  SKIN: Warm and dry NEUROLOGIC:  Alert and oriented x 3 PSYCHIATRIC:  Normal affect   ASSESSMENT:    1. Atrial flutter, unspecified type ([place])   2. Essential hypertension   3. PVC (premature ventricular contraction)    PLAN:    In order of problems listed above:  1.  Paroxysmal atrial flutter -s/p aflutter ablation 05/2019 -he has not had any reoccurrence of his aflutter abut does occasionally has some mild skipped beats -continue apixaban 5mg  BID and Lopressor 37.5mg  BID -creatinine 2.10 on 10/18/2019 -Hbg 9.4 on 10/19/2019 after UGI  -EKG today shows NSR with PVCs and PACs -he has followup with Dr. Lovena Le on 12/12/2019 -I am going to get a sleep study as his wife is concerned that he is too sleepy during the day  2.  HTN -BP controlled -continue Lopressor 37.5mg  BID, HCTZ 12.5mg  daily, amlodipine 5mg  daily and Hydralazine 100mg  TID  3.  PVC -denies any palpitations -continue BB  4.  CKD stage 3 -creatinine was 2.10 a week ago -he is followed by a nephrologist in [place]  5.  UGI -recently hospitalized with UGI -GI did endo showing HH/gastritis and non bleeding gastric ulcer felt to be low risk for further bleeding -continue PPI -GI felt safe to continue anticoagulation for aflutter   Medication Adjustments/Labs and Tests Ordered: Current medicines are reviewed at length with the patient today.   Concerns regarding medicines are outlined above.  No orders of the defined types were placed in this encounter.  No orders of the defined types were placed in this encounter.   Signed, Paul Him, MD  10/27/2019 11:42 AM    [place]

## 2019-10-27 ENCOUNTER — Ambulatory Visit: Payer: Medicare HMO | Admitting: Cardiology

## 2019-10-27 ENCOUNTER — Other Ambulatory Visit: Payer: Self-pay

## 2019-10-27 ENCOUNTER — Encounter: Payer: Self-pay | Admitting: Cardiology

## 2019-10-27 ENCOUNTER — Telehealth: Payer: Self-pay | Admitting: *Deleted

## 2019-10-27 VITALS — BP 140/72 | HR 50 | Ht 73.0 in | Wt 233.8 lb

## 2019-10-27 DIAGNOSIS — I4892 Unspecified atrial flutter: Secondary | ICD-10-CM | POA: Diagnosis not present

## 2019-10-27 DIAGNOSIS — G4719 Other hypersomnia: Secondary | ICD-10-CM

## 2019-10-27 DIAGNOSIS — I493 Ventricular premature depolarization: Secondary | ICD-10-CM | POA: Diagnosis not present

## 2019-10-27 DIAGNOSIS — I10 Essential (primary) hypertension: Secondary | ICD-10-CM

## 2019-10-27 NOTE — Patient Instructions (Signed)
Medication Instructions:  Your physician recommends that you continue on your current medications as directed. Please refer to the Current Medication list given to you today.  *If you need a refill on your cardiac medications before your next appointment, please call your pharmacy*  Testing/Procedures: Your physician has recommended that you have a sleep study. This test records several body functions during sleep, including: brain activity, eye movement, oxygen and carbon dioxide blood levels, heart rate and rhythm, breathing rate and rhythm, the flow of air through your mouth and nose, snoring, body muscle movements, and chest and belly movement.  Follow-Up: At [facility], you and your health needs are our priority.  As part of our continuing mission to provide you with exceptional heart care, we have created designated Provider Care Teams.  These Care Teams include your primary Cardiologist (physician) and Advanced Practice Providers (APPs -  Physician Assistants and Nurse Practitioners) who all work together to provide you with the care you need, when you need it.  Your next appointment:   6 month(s)  The format for your next appointment:   Either In Person or Virtual  Provider:   You may see [name], MD or one of the following Advanced Practice Providers on your designated Care Team:    Melina Copa, PA-C  Ermalinda Barrios, PA-C

## 2019-10-27 NOTE — Telephone Encounter (Signed)
-----   Message from Antonieta Iba, RN sent at 10/27/2019 12:02 PM EST ----- Home sleep study has been ordered.  Thanks!

## 2019-10-30 DIAGNOSIS — R69 Illness, unspecified: Secondary | ICD-10-CM | POA: Diagnosis not present

## 2019-10-30 NOTE — Addendum Note (Signed)
Addended by: Antonieta Iba on: 10/30/2019 03:20 PM   Modules accepted: Orders

## 2019-10-31 DIAGNOSIS — M79651 Pain in right thigh: Secondary | ICD-10-CM | POA: Diagnosis not present

## 2019-11-03 ENCOUNTER — Encounter (HOSPITAL_BASED_OUTPATIENT_CLINIC_OR_DEPARTMENT_OTHER): Payer: Self-pay | Admitting: Emergency Medicine

## 2019-11-03 ENCOUNTER — Encounter: Payer: Self-pay | Admitting: Family Medicine

## 2019-11-03 ENCOUNTER — Ambulatory Visit: Payer: Medicare HMO | Admitting: Family Medicine

## 2019-11-03 ENCOUNTER — Telehealth: Payer: Self-pay | Admitting: *Deleted

## 2019-11-03 ENCOUNTER — Emergency Department (HOSPITAL_BASED_OUTPATIENT_CLINIC_OR_DEPARTMENT_OTHER)
Admission: EM | Admit: 2019-11-03 | Discharge: 2019-11-03 | Disposition: A | Discharge status: Home / Self Care | Payer: Medicare HMO | Attending: Emergency Medicine | Admitting: Emergency Medicine

## 2019-11-03 ENCOUNTER — Other Ambulatory Visit: Payer: Self-pay

## 2019-11-03 ENCOUNTER — Emergency Department (HOSPITAL_BASED_OUTPATIENT_CLINIC_OR_DEPARTMENT_OTHER): Payer: Medicare HMO

## 2019-11-03 DIAGNOSIS — G5701 Lesion of sciatic nerve, right lower limb: Secondary | ICD-10-CM

## 2019-11-03 DIAGNOSIS — Z79899 Other long term (current) drug therapy: Secondary | ICD-10-CM | POA: Insufficient documentation

## 2019-11-03 DIAGNOSIS — R52 Pain, unspecified: Secondary | ICD-10-CM

## 2019-11-03 DIAGNOSIS — Z7901 Long term (current) use of anticoagulants: Secondary | ICD-10-CM | POA: Diagnosis not present

## 2019-11-03 DIAGNOSIS — M1611 Unilateral primary osteoarthritis, right hip: Secondary | ICD-10-CM | POA: Insufficient documentation

## 2019-11-03 DIAGNOSIS — Z888 Allergy status to other drugs, medicaments and biological substances status: Secondary | ICD-10-CM | POA: Insufficient documentation

## 2019-11-03 DIAGNOSIS — I4891 Unspecified atrial fibrillation: Secondary | ICD-10-CM | POA: Insufficient documentation

## 2019-11-03 DIAGNOSIS — M545 Low back pain, unspecified: Secondary | ICD-10-CM | POA: Insufficient documentation

## 2019-11-03 DIAGNOSIS — M25551 Pain in right hip: Secondary | ICD-10-CM | POA: Diagnosis present

## 2019-11-03 DIAGNOSIS — Z96641 Presence of right artificial hip joint: Secondary | ICD-10-CM | POA: Diagnosis not present

## 2019-11-03 DIAGNOSIS — Z85528 Personal history of other malignant neoplasm of kidney: Secondary | ICD-10-CM | POA: Diagnosis not present

## 2019-11-03 DIAGNOSIS — I129 Hypertensive chronic kidney disease with stage 1 through stage 4 chronic kidney disease, or unspecified chronic kidney disease: Secondary | ICD-10-CM | POA: Diagnosis not present

## 2019-11-03 DIAGNOSIS — N183 Chronic kidney disease, stage 3 unspecified: Secondary | ICD-10-CM | POA: Diagnosis not present

## 2019-11-03 DIAGNOSIS — Z471 Aftercare following joint replacement surgery: Secondary | ICD-10-CM | POA: Diagnosis not present

## 2019-11-03 MED ORDER — KETOROLAC TROMETHAMINE 30 MG/ML IJ SOLN
30.0000 mg | Freq: Once | INTRAMUSCULAR | Status: AC
  Administered 2019-11-03: 30 mg via INTRAMUSCULAR
  Filled 2019-11-03: qty 1

## 2019-11-03 MED ORDER — HYDROCODONE-ACETAMINOPHEN 5-325 MG PO TABS: 1.0000 | ORAL_TABLET | Freq: Three times a day (TID) | ORAL | 0 refills | Status: DC | PRN

## 2019-11-03 MED ORDER — BACLOFEN 10 MG PO TABS: 10.0000 mg | ORAL_TABLET | Freq: Two times a day (BID) | ORAL | 0 refills | Status: DC | PRN

## 2019-11-03 MED ORDER — DICLOFENAC SODIUM 1 % EX GEL: 4.0000 g | Freq: Four times a day (QID) | CUTANEOUS | 0 refills | Status: DC

## 2019-11-03 MED ORDER — PREDNISONE 5 MG PO TABS: ORAL_TABLET | ORAL | 0 refills | Status: DC

## 2019-11-03 MED ORDER — ACETAMINOPHEN 500 MG PO TABS
1000.0000 mg | ORAL_TABLET | Freq: Once | ORAL | Status: AC
  Administered 2019-11-03: 1000 mg via ORAL
  Filled 2019-11-03: qty 2

## 2019-11-03 NOTE — Telephone Encounter (Signed)
Staff message sent to Paul Roach ok to schedule HST. Patient has [employer], which does not require a PA.

## 2019-11-03 NOTE — Patient Instructions (Signed)
Nice to meet you  Please try the medicines. The muscle relaxer and norco can make you sleepy. Please do not drive with the norco  Please try the exercises  Please send me a message in MyChart with any questions or updates.  Please see me back in 2-3 weeks.   --Dr. Raeford Razor

## 2019-11-03 NOTE — ED Triage Notes (Signed)
Pt reports right sided hip pain since Saturday, states he had a replacement in that hip over 20 years ago. States pain in hip radiating down to knee. No trauma/falls. States virtual visit with PCP and started prednisone.

## 2019-11-03 NOTE — ED Provider Notes (Signed)
[facility] EMERGENCY DEPARTMENT Provider Note   CSN: [id number] Arrival date & time: 11/03/19  0200     History Chief Complaint  Patient presents with  . Hip Pain    Dearl Rudden Sr. is a 74 y.o. male.  The history is provided by the patient.  Hip Pain This is a recurrent problem. The current episode started more than 2 days ago. The problem occurs constantly. The problem has not changed since onset.Pertinent negatives include no chest pain, no abdominal pain, no headaches and no shortness of breath. Nothing aggravates the symptoms. Nothing relieves the symptoms. He has tried nothing for the symptoms. The treatment provided no relief.  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  Was started on prednisone taper by his PMD. It initially helped now pain returned.  No f/c/r.  No urinary symptoms no foot or calf pain.  No wounds.  No trauma.       Past Medical History:  Diagnosis Date  . Abnormal EKG   . Anemia   . Atrial fibrillation ([place])   . CKD (chronic kidney disease), stage III   . Essential hypertension 07/18/2009   Qualifier: Diagnosis of  By: [name], [name]    . Fatigue   . Hypertension   . KIDNEY CANCER 07/18/2009   s/p right neprectomy  . Moderate persistent asthma 07/18/2009       . Nasal polyposis 12/16/2016  . Onychomycosis 12/19/2014  . Pain in lower limb 12/19/2014  . Palpitations   . Perennial allergic rhinitis with nonallergic component 06/17/2016  . PVC (premature ventricular contraction)   . Sinusitis, maxillary, chronic 11/09/2012    Patient Active Problem List   Diagnosis Date Noted  . GIB (gastrointestinal bleeding) 10/18/2019  . Chronic anticoagulation 10/18/2019  . Melena 10/18/2019  . Hematemesis 10/18/2019  . Anemia 10/18/2019  . GI bleed 10/18/2019  . Abnormal EKG   . PVC (premature ventricular contraction)   . CKD (chronic kidney disease), stage III   . Unspecified  atrial flutter ([place]) 03/28/2019  . PAC (premature atrial contraction) 12/21/2018  . Nasal polyposis 12/16/2016  . Perennial allergic rhinitis with nonallergic component 06/17/2016  . Onychomycosis 12/19/2014  . Pain in lower limb 12/19/2014  . Sinusitis, maxillary, chronic 11/09/2012  . KIDNEY CANCER 07/18/2009  . Essential hypertension 07/18/2009  . Moderate persistent asthma 07/18/2009    Past Surgical History:  Procedure Laterality Date  . A-FLUTTER ABLATION N/A 05/08/2019   Procedure: A-FLUTTER ABLATION;  Surgeon: [name], MD;  Location: [facility];  Service: Cardiovascular;  Laterality: N/A;  . ESOPHAGOGASTRODUODENOSCOPY (EGD) WITH PROPOFOL Left 10/19/2019   Procedure: ESOPHAGOGASTRODUODENOSCOPY (EGD) WITH PROPOFOL;  Surgeon: [name], MD;  Location: [facility] ENDOSCOPY;  Service: Endoscopy;  Laterality: Left;  . HEMORRHOID SURGERY    . right ankle surgery    . right hip replacement    . right kidney removed  2000   for renal ca  . SINOSCOPY         Family History  Problem Relation Age of Onset  . Hypertension Mother   . Diabetes Mother   . Colon cancer Sister   . Lung disease Neg Hx   . Allergic rhinitis Neg Hx   . Angioedema Neg Hx   . Asthma Neg Hx   . Eczema Neg Hx   . Immunodeficiency Neg Hx   . Urticaria Neg Hx     Social History  Tobacco Use  . Smoking status: Never Smoker  . Smokeless tobacco: Never Used  Substance Use Topics  . Alcohol use: Yes    Alcohol/week: 0.0 standard drinks    Comment: 1-2 drinks/month  . Drug use: No    Home Medications Prior to Admission medications   Medication Sig Start Date End Date Taking? Authorizing Provider  amLODipine (NORVASC) 5 MG tablet Take 5 mg by mouth every evening.  11/05/18   [provider]  apixaban (ELIQUIS) 5 MG TABS tablet Take 5 mg by mouth 2 (two) times daily.    [provider]  budesonide-formoterol (SYMBICORT) 160-4.5 MCG/ACT inhaler Inhale 2 puffs into the lungs  2 (two) times daily.    [provider]  hydrALAZINE (APRESOLINE) 100 MG tablet Take 100 mg by mouth 3 (three) times daily.    [provider]  hydrochlorothiazide (HYDRODIURIL) 12.5 MG tablet Take 12.5 mg by mouth every Monday, Wednesday, and Friday.     [provider]  metoprolol tartrate (LOPRESSOR) 25 MG tablet Take 1.5 tablets (37.5 mg total) by mouth 2 (two) times daily. 06/05/19 10/17/28  [name], MD  pantoprazole (PROTONIX) 40 MG tablet Take 1 tablet (40 mg total) by mouth 2 (two) times daily before a meal. 10/20/19 12/01/19  Antonieta Pert, MD    Allergies    Altace [ramipril] and Cozaar [losartan potassium]  Review of Systems   Review of Systems  Constitutional: Negative for fever.  HENT: Negative for congestion.   Eyes: Negative for visual disturbance.  Respiratory: Negative for shortness of breath.   Cardiovascular: Negative for chest pain.  Gastrointestinal: Negative for abdominal pain.  Genitourinary: Negative for difficulty urinating.  Musculoskeletal: Positive for arthralgias. Negative for back pain.  Skin: Negative for wound.  Neurological: Negative for weakness and headaches.  Psychiatric/Behavioral: Negative for agitation.  All other systems reviewed and are negative.   Physical Exam Updated Vital Signs BP (!) 166/60 (BP Location: Right Arm)   Pulse 77   Temp 98.1 F (36.7 C) (Oral)   Resp 18   Ht 6\' 1"  (1.854 m)   Wt 105.4 kg   SpO2 98%   BMI 30.66 kg/m   Physical Exam Vitals and nursing note reviewed.  Constitutional:      General: He is not in acute distress.    Appearance: Normal appearance.  HENT:     Head: Normocephalic and atraumatic.     Nose: Nose normal.  Eyes:     Conjunctiva/sclera: Conjunctivae normal.     Pupils: Pupils are equal, round, and reactive to light.  Cardiovascular:     Rate and Rhythm: Normal rate and regular rhythm.     Pulses: Normal pulses.     Heart sounds: Normal heart sounds.    Pulmonary:     Effort: Pulmonary effort is normal.     Breath sounds: Normal breath sounds.  Abdominal:     General: Abdomen is flat. Bowel sounds are normal.     Tenderness: There is no abdominal tenderness. There is no guarding or rebound.  Musculoskeletal:        General: No swelling or tenderness. Normal range of motion.     Cervical back: Normal range of motion and neck supple.     Right hip: Normal.     Left hip: Normal.     Right upper leg: Normal.     Right knee: Normal.     Right lower leg: Normal. No swelling. No edema.     Right  ankle: Normal.     Right Achilles Tendon: Normal.     Right foot: Normal.  Skin:    General: Skin is warm and dry.     Capillary Refill: Capillary refill takes less than 2 seconds.  Neurological:     General: No focal deficit present.     Mental Status: He is alert and oriented to person, place, and time.     Deep Tendon Reflexes: Reflexes normal.  Psychiatric:        Mood and Affect: Mood normal.        Behavior: Behavior normal.     ED Results / Procedures / Treatments   Labs (all labs ordered are listed, but only abnormal results are displayed) Labs Reviewed - No data to display  EKG None  Radiology No results found.  Procedures Procedures (including critical care time)  Medications Ordered in ED Medications  ketorolac (TORADOL) 30 MG/ML injection 30 mg (has no administration in time range)  acetaminophen (TYLENOL) tablet 1,000 mg (1,000 mg Oral Given 11/03/19 0304)    ED Course  I have reviewed the triage vital signs and the nursing notes.  Pertinent labs & imaging results that were available during my care of the patient were reviewed by me and considered in my medical decision making (see chart for details).  I do not think this is a clot, there is no calf tenderness and the patient is on full anticoagulation.  It is limited to the hip.  No signs of infection on Xray.  Will refer to sports medicine.   Davon Abdelaziz  Sr. was evaluated in Emergency Department on 11/03/2019 for the symptoms described in the history of present illness. He was evaluated in the context of the global COVID-19 pandemic, which necessitated consideration that the patient might be at risk for infection with the SARS-CoV-2 virus that causes COVID-19. Institutional protocols and algorithms that pertain to the evaluation of patients at risk for COVID-19 are in a state of rapid change based on information released by regulatory bodies including the CDC and federal and state organizations. These policies and algorithms were followed during the patient's care in the ED.  Final Clinical Impression(s) / ED Diagnoses Return for weakness, numbness, changes in vision or speech, fevers >100.4 unrelieved by medication, shortness of breath, intractable vomiting, or diarrhea, abdominal pain, Inability to tolerate liquids or food, cough, altered mental status or any concerns. No signs of systemic illness or infection. The patient is nontoxic-appearing on exam and vital signs are within normal limits.   I have reviewed the triage vital signs and the nursing notes. Pertinent labs &imaging results that were available during my care of the patient were reviewed by me and considered in my medical decision making (see chart for details).  After history, exam, and medical workup I feel the patient has been appropriately medically screened and is safe for discharge home. Pertinent diagnoses were discussed with the patient. Patient was given return precautions   Marios Gaiser, MD 11/03/19 [phone]

## 2019-11-03 NOTE — Progress Notes (Signed)
Paul Trettin Sr. - 74 y.o. male MRN [id number]  Date of birth: 02/29/1946  SUBJECTIVE:  Including CC & ROS.  Chief Complaint  Patient presents with  . Hip Pain    right hip x 1 week    Paul Brew Sr. is a 74 y.o. male that is presenting with right leg pain.  The pain is been ongoing for 1 week.  It is become more severe and constant.  It is worse at night.  He has a history of a total hip arthroplasty from 1998.  No new or different exercises.  He tries to be active on a regular basis and works a part-time job.  He was seen in the emergency department earlier today and limited improvement with Toradol injection..  Independent review of the right hip and AP pelvis from 1/29 shows a right total hip arthroplasty that appears to be stable position.   Review of Systems See HPI   HISTORY: Past Medical, Surgical, Social, and Family History Reviewed & Updated per EMR.   Pertinent Historical Findings include:  Past Medical History:  Diagnosis Date  . Abnormal EKG   . Anemia   . Atrial fibrillation ([place])   . CKD (chronic kidney disease), stage III   . Essential hypertension 07/18/2009   Qualifier: Diagnosis of  By: [name], [name]    . Fatigue   . Hypertension   . KIDNEY CANCER 07/18/2009   s/p right neprectomy  . Moderate persistent asthma 07/18/2009       . Nasal polyposis 12/16/2016  . Onychomycosis 12/19/2014  . Pain in lower limb 12/19/2014  . Palpitations   . Perennial allergic rhinitis with nonallergic component 06/17/2016  . PVC (premature ventricular contraction)   . Sinusitis, maxillary, chronic 11/09/2012    Past Surgical History:  Procedure Laterality Date  . A-FLUTTER ABLATION N/A 05/08/2019   Procedure: A-FLUTTER ABLATION;  Surgeon: [name], MD;  Location: [facility];  Service: Cardiovascular;  Laterality: N/A;  . ESOPHAGOGASTRODUODENOSCOPY (EGD) WITH PROPOFOL Left 10/19/2019   Procedure: ESOPHAGOGASTRODUODENOSCOPY (EGD) WITH PROPOFOL;  Surgeon:  [name], MD;  Location: [facility] ENDOSCOPY;  Service: Endoscopy;  Laterality: Left;  . HEMORRHOID SURGERY    . right ankle surgery    . right hip replacement    . right kidney removed  2000   for renal ca  . SINOSCOPY      Allergies  Allergen Reactions  . Altace [Ramipril] Other (See Comments)    Patient is unaware of allergy  . Cozaar [Losartan Potassium] Other (See Comments)    Patient is unaware of allergy    Family History  Problem Relation Age of Onset  . Hypertension Mother   . Diabetes Mother   . Colon cancer Sister   . Lung disease Neg Hx   . Allergic rhinitis Neg Hx   . Angioedema Neg Hx   . Asthma Neg Hx   . Eczema Neg Hx   . Immunodeficiency Neg Hx   . Urticaria Neg Hx      Social History   Socioeconomic History  . Marital status: Married    Spouse name: Not on file  . Number of children: 3  . Years of education: Not on file  . Highest education level: Not on file  Occupational History  . Occupation: Retired    Comment: Patent attorney  Tobacco Use  . Smoking status: Never Smoker  . Smokeless tobacco: Never Used  Substance and Sexual Activity  . Alcohol use: Yes  Alcohol/week: 0.0 standard drinks    Comment: 1-2 drinks/month  . Drug use: No  . Sexual activity: Not on file  Other Topics Concern  . Not on file  Social History Narrative   Originally from [place]. Moved to [place] in 1996. Has also lived in [place] state. Has traveled to [place], [place], [place], [place], [place], [place], [place], [place], [place], [place], [place], [place], [place], [place], [place], [place], [place], [place], & [place]. 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 No pets currently. No bird, mold or hot tub exposure. Enjoys exercising regularly, fishing & traveling.    Social Determinants of Health   Financial Resource Strain:   . Difficulty of Paying Living Expenses: Not on file  Food Insecurity:   .  Worried About Charity fundraiser in the Last Year: Not on file  . Ran Out of Food in the Last Year: Not on file  Transportation Needs:   . Lack of Transportation (Medical): Not on file  . Lack of Transportation (Non-Medical): Not on file  Physical Activity:   . Days of Exercise per Week: Not on file  . Minutes of Exercise per Session: Not on file  Stress:   . Feeling of Stress : Not on file  Social Connections:   . Frequency of Communication with Friends and Family: Not on file  . Frequency of Social Gatherings with Friends and Family: Not on file  . Attends Religious Services: Not on file  . Active Member of Clubs or Organizations: Not on file  . Attends Archivist Meetings: Not on file  . Marital Status: Not on file  Intimate Partner Violence:   . Fear of Current or Ex-Partner: Not on file  . Emotionally Abused: Not on file  . Physically Abused: Not on file  . Sexually Abused: Not on file     PHYSICAL EXAM:  VS: BP 139/71   Pulse 73   Ht 6\' 1"  (1.854 m)   Wt 230 lb (104.3 kg)   BMI 30.34 kg/m  Physical Exam Gen: NAD, alert, cooperative with exam, well-appearing ENT: normal lips, normal nasal mucosa,  Eye: normal EOM, normal conjunctiva and lids Skin: no rashes, no areas of induration  Neuro: normal tone, normal sensation to touch Psych:  normal insight, alert and oriented MSK:  Right leg/hip: No signs of atrophy. Has good range of motion with surgical component. Normal strength resistance with hip flexion, knee flexion extension. +2 deep tendon reflexes at the knee. Negative straight leg raise. Neurovascularly intact     ASSESSMENT & PLAN:   Piriformis syndrome of right side Pain is acutely occurring.  It seems more likely to be piriformis.  His right total hip arthroplasty was done with a posterior approach so would have cut this muscle.  May have gotten weak recently and cause the impingement of the sciatic nerve.  Could be possibly related to  radiculopathy from the lumbar area. -Prednisone. -Baclofen. -[place] on home exercise therapy and supportive care. -If no improvement can consider imaging.  Could consider SI joint injection.

## 2019-11-03 NOTE — Assessment & Plan Note (Addendum)
Pain is acutely occurring.  It seems more likely to be piriformis.  His right total hip arthroplasty was done with a posterior approach so would have cut this muscle.  May have gotten weak recently and cause the impingement of the sciatic nerve.  Could be possibly related to radiculopathy from the lumbar area. -Prednisone. -Baclofen. -[place] on home exercise therapy and supportive care. -If no improvement can consider imaging.  Could consider SI joint injection.

## 2019-11-03 NOTE — ED Notes (Signed)
Ambulatory to XRAY

## 2019-11-07 ENCOUNTER — Encounter: Payer: Self-pay | Admitting: Family Medicine

## 2019-11-07 ENCOUNTER — Ambulatory Visit: Payer: Self-pay

## 2019-11-07 ENCOUNTER — Ambulatory Visit (INDEPENDENT_AMBULATORY_CARE_PROVIDER_SITE_OTHER): Payer: Medicare HMO | Admitting: Family Medicine

## 2019-11-07 ENCOUNTER — Ambulatory Visit (HOSPITAL_BASED_OUTPATIENT_CLINIC_OR_DEPARTMENT_OTHER)
Admission: RE | Admit: 2019-11-07 | Discharge: 2019-11-07 | Disposition: A | Discharge status: Home / Self Care | Payer: Medicare HMO | Source: Ambulatory Visit | Attending: Family Medicine | Admitting: Family Medicine

## 2019-11-07 ENCOUNTER — Telehealth: Payer: Self-pay | Admitting: Family Medicine

## 2019-11-07 ENCOUNTER — Other Ambulatory Visit: Payer: Self-pay

## 2019-11-07 VITALS — BP 148/69 | HR 67 | Ht 73.0 in | Wt 230.0 lb

## 2019-11-07 DIAGNOSIS — M5441 Lumbago with sciatica, right side: Secondary | ICD-10-CM

## 2019-11-07 DIAGNOSIS — M545 Low back pain: Secondary | ICD-10-CM | POA: Diagnosis not present

## 2019-11-07 NOTE — Telephone Encounter (Signed)
Informed of results.   Rosemarie Ax, MD [facility] 11/07/2019, 4:59 PM

## 2019-11-07 NOTE — Telephone Encounter (Signed)
Patient is aware and agreeable to Home Sleep Study through [facility]. Patient is scheduled for 01/11/20 at 9 am to pick up home sleep kit and meet with Respiratory therapist at [facility]. Patient is aware that if this appointment date and time does not work for them they should contact Artis Delay directly at [phone]. Patient is aware that a sleep packet will be sent from [facility] in week. Patient is agreeable to treatment and thankful for call.

## 2019-11-07 NOTE — Patient Instructions (Signed)
Good to see you Please try ice Please continue the exercises Physical therapy will give you a call  I will call with the results from today   Please send me a message in MyChart with any questions or updates.  Please see me back in 3 weeks.   --Dr. Raeford Razor

## 2019-11-07 NOTE — Assessment & Plan Note (Signed)
Pain is acutely worsening.  No improvement with medications.  Discussed an SI joint injection today but decided to put that on hold.  Appears more radicular and less associated with the hip arthroplasty. -Referral to physical therapy. -Lumbar x-ray. -Would consider CT scan of the hip as it was done in 1998.  Could consider SI joint injection.

## 2019-11-07 NOTE — Telephone Encounter (Signed)
  Lauralee Evener, CMA  Freada Bergeron, CMA  Ok to schedule HST.patient has [facility] and it does not require a PA.

## 2019-11-07 NOTE — Progress Notes (Signed)
Paul Letizia Sr. - 74 y.o. male MRN [id number]  Date of birth: May 25, 1946  SUBJECTIVE:  Including CC & ROS.  Chief Complaint  Patient presents with  . Back Pain    right-sided low back    Paul Matheson Sr. is a 74 y.o. male that is presenting with worsening right-sided leg pain.  He was prescribed different medications which have not improved his symptoms whatsoever.  His symptoms seem to be worse at night when lying on the affected side.  He is feeling the pain around his knee as well as his lateral aspect of his thigh and calf.   Review of Systems See HPI   HISTORY: Past Medical, Surgical, Social, and Family History Reviewed & Updated per EMR.   Pertinent Historical Findings include:  Past Medical History:  Diagnosis Date  . Abnormal EKG   . Anemia   . Atrial fibrillation ([place])   . CKD (chronic kidney disease), stage III   . Essential hypertension 07/18/2009   Qualifier: Diagnosis of  By: [name], [name]    . Fatigue   . Hypertension   . KIDNEY CANCER 07/18/2009   s/p right neprectomy  . Moderate persistent asthma 07/18/2009       . Nasal polyposis 12/16/2016  . Onychomycosis 12/19/2014  . Pain in lower limb 12/19/2014  . Palpitations   . Perennial allergic rhinitis with nonallergic component 06/17/2016  . PVC (premature ventricular contraction)   . Sinusitis, maxillary, chronic 11/09/2012    Past Surgical History:  Procedure Laterality Date  . A-FLUTTER ABLATION N/A 05/08/2019   Procedure: A-FLUTTER ABLATION;  Surgeon: [name], MD;  Location: [facility];  Service: Cardiovascular;  Laterality: N/A;  . ESOPHAGOGASTRODUODENOSCOPY (EGD) WITH PROPOFOL Left 10/19/2019   Procedure: ESOPHAGOGASTRODUODENOSCOPY (EGD) WITH PROPOFOL;  Surgeon: [name], MD;  Location: [facility] ENDOSCOPY;  Service: Endoscopy;  Laterality: Left;  . HEMORRHOID SURGERY    . right ankle surgery    . right hip replacement    . right kidney removed  2000   for renal ca  . SINOSCOPY       Allergies  Allergen Reactions  . Altace [Ramipril] Other (See Comments)    Patient is unaware of allergy  . Cozaar [Losartan Potassium] Other (See Comments)    Patient is unaware of allergy    Family History  Problem Relation Age of Onset  . Hypertension Mother   . Diabetes Mother   . Colon cancer Sister   . Lung disease Neg Hx   . Allergic rhinitis Neg Hx   . Angioedema Neg Hx   . Asthma Neg Hx   . Eczema Neg Hx   . Immunodeficiency Neg Hx   . Urticaria Neg Hx      Social History   Socioeconomic History  . Marital status: Married    Spouse name: Not on file  . Number of children: 3  . Years of education: Not on file  . Highest education level: Not on file  Occupational History  . Occupation: Retired    Comment: Patent attorney  Tobacco Use  . Smoking status: Never Smoker  . Smokeless tobacco: Never Used  Substance and Sexual Activity  . Alcohol use: Yes    Alcohol/week: 0.0 standard drinks    Comment: 1-2 drinks/month  . Drug use: No  . Sexual activity: Not on file  Other Topics Concern  . Not on file  Social History Narrative   Originally from [place]. Moved to  in 1996. Has  also lived in [place]. Has traveled to [place], [place], [place], [place], [place], [place], [place], [place], [place], [place], [place], [place], [place], [place], [place], [place], [place], [place], & [place]. 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 No pets currently. No bird, mold or hot tub exposure. Enjoys exercising regularly, fishing & traveling.    Social Determinants of Health   Financial Resource Strain:   . Difficulty of Paying Living Expenses: Not on file  Food Insecurity:   . Worried About Charity fundraiser in the Last Year: Not on file  . Ran Out of Food in the Last Year: Not on file  Transportation Needs:   . Lack of Transportation (Medical): Not on file  . Lack of Transportation (Non-Medical):  Not on file  Physical Activity:   . Days of Exercise per Week: Not on file  . Minutes of Exercise per Session: Not on file  Stress:   . Feeling of Stress : Not on file  Social Connections:   . Frequency of Communication with Friends and Family: Not on file  . Frequency of Social Gatherings with Friends and Family: Not on file  . Attends Religious Services: Not on file  . Active Member of Clubs or Organizations: Not on file  . Attends Archivist Meetings: Not on file  . Marital Status: Not on file  Intimate Partner Violence:   . Fear of Current or Ex-Partner: Not on file  . Emotionally Abused: Not on file  . Physically Abused: Not on file  . Sexually Abused: Not on file     PHYSICAL EXAM:  VS: BP (!) 148/69   Pulse 67   Ht 6\' 1"  (1.854 m)   Wt 230 lb (104.3 kg)   BMI 30.34 kg/m  Physical Exam Gen: NAD, alert, cooperative with exam, well-appearing ENT: normal lips, normal nasal mucosa,  Eye: normal EOM, normal conjunctiva and lids Skin: no rashes, no areas of induration  Neuro: normal tone, normal sensation to touch Psych:  normal insight, alert and oriented MSK:  Right leg: Normal internal and external rotation. Normal strength resistance with hip flexion. Negative straight leg raise. Neurovascularly intact     ASSESSMENT & PLAN:   Low back pain Pain is acutely worsening.  No improvement with medications.  Discussed an SI joint injection today but decided to put that on hold.  Appears more radicular and less associated with the hip arthroplasty. -Referral to physical therapy. -Lumbar x-ray. -Would consider CT scan of the hip as it was done in 1998.  Could consider SI joint injection.

## 2019-11-13 ENCOUNTER — Ambulatory Visit: Payer: Medicare HMO | Attending: Internal Medicine

## 2019-11-13 DIAGNOSIS — Z23 Encounter for immunization: Secondary | ICD-10-CM | POA: Insufficient documentation

## 2019-11-13 NOTE — Progress Notes (Signed)
   [id number]  Name:  Paul Harnois Sr.    MRN: [id number] DOB: 1946-04-07  11/13/2019  [name] was observed post Covid-19 immunization for 15 minutes without incidence. He was provided with Vaccine Information Sheet and instruction to access the V-Safe system.   [name] was instructed to call 911 with any severe reactions post vaccine: Marland Kitchen Difficulty breathing  . Swelling of your face and throat  . A fast heartbeat  . A bad rash all over your body  . Dizziness and weakness    Immunizations Administered    Name Date Dose VIS Date Route   Pfizer COVID-19 Vaccine 11/13/2019  4:47 PM 0.3 mL 09/15/2019 Intramuscular   Manufacturer: [employer]   Lot: [id number]   [place]: [id number]

## 2019-11-15 DIAGNOSIS — D649 Anemia, unspecified: Secondary | ICD-10-CM | POA: Diagnosis not present

## 2019-11-15 DIAGNOSIS — K259 Gastric ulcer, unspecified as acute or chronic, without hemorrhage or perforation: Secondary | ICD-10-CM | POA: Diagnosis not present

## 2019-11-15 DIAGNOSIS — B9681 Helicobacter pylori [H. pylori] as the cause of diseases classified elsewhere: Secondary | ICD-10-CM | POA: Diagnosis not present

## 2019-11-22 ENCOUNTER — Ambulatory Visit: Payer: Medicare HMO | Admitting: Family Medicine

## 2019-11-29 ENCOUNTER — Other Ambulatory Visit: Payer: Self-pay

## 2019-11-29 ENCOUNTER — Ambulatory Visit: Payer: Medicare HMO | Admitting: Family Medicine

## 2019-11-29 ENCOUNTER — Ambulatory Visit: Payer: Self-pay

## 2019-11-29 ENCOUNTER — Encounter: Payer: Self-pay | Admitting: Family Medicine

## 2019-11-29 VITALS — BP 137/79 | HR 76 | Ht 73.0 in | Wt 230.0 lb

## 2019-11-29 DIAGNOSIS — M5441 Lumbago with sciatica, right side: Secondary | ICD-10-CM | POA: Diagnosis not present

## 2019-11-29 DIAGNOSIS — M79642 Pain in left hand: Secondary | ICD-10-CM

## 2019-11-29 DIAGNOSIS — M170 Bilateral primary osteoarthritis of knee: Secondary | ICD-10-CM | POA: Insufficient documentation

## 2019-11-29 DIAGNOSIS — M10042 Idiopathic gout, left hand: Secondary | ICD-10-CM

## 2019-11-29 MED ORDER — PREDNISONE 20 MG PO TABS: ORAL_TABLET | ORAL | 0 refills | Status: DC

## 2019-11-29 NOTE — Assessment & Plan Note (Signed)
Pain has improved slightly.  Still notices changes of sensation from time to time. -Counseled on home exercise therapy and supportive care. -Continue with physical therapy. -If pain is ongoing since would consider MRI to evaluate for nerve impingement.

## 2019-11-29 NOTE — Assessment & Plan Note (Signed)
It appears he has acutely occurring gout in this area.  There was hyperechoic areas on ultrasound.  No history of similar symptoms. - prednisone.  Avoiding NSAIDs with GI bleed and chronic kidney disease. -Uric acid. -Could consider injection if needed.

## 2019-11-29 NOTE — Progress Notes (Signed)
Paul Masi Sr. - 74 y.o. male MRN [id number]  Date of birth: 07-16-1946  SUBJECTIVE:  Including CC & ROS.  Chief Complaint  Patient presents with  . Follow-up    follow up for right hip    Paul Manasco Sr. is a 74 y.o. male that is presenting with ongoing low back with right-sided sciatica type symptoms, bilateral knee pain, and left dorsal hand pain.  His sciatic type symptoms have improved and are not as severe.  He feels these from time to time.  Has been experiencing bilateral knee pain.  Right is worse than the left.  Is occurring over the medial joint line.  Denies any inciting event or trauma.  Has been having to rub cream on it help with the pain.  Experiencing left dorsal wrist and hand swelling.  This started last night.  Did not have any inciting event or trauma.  Has not been doing any repetitive motions or movements.  Having some pain associated with it.  No history of similar symptoms.   Review of Systems See HPI   HISTORY: Past Medical, Surgical, Social, and Family History Reviewed & Updated per EMR.   Pertinent Historical Findings include:  Past Medical History:  Diagnosis Date  . Abnormal EKG   . Anemia   . Atrial fibrillation ([place])   . CKD (chronic kidney disease), stage III   . Essential hypertension 07/18/2009   Qualifier: Diagnosis of  By: [name], [name]    . Fatigue   . Hypertension   . KIDNEY CANCER 07/18/2009   s/p right neprectomy  . Moderate persistent asthma 07/18/2009       . Nasal polyposis 12/16/2016  . Onychomycosis 12/19/2014  . Pain in lower limb 12/19/2014  . Palpitations   . Perennial allergic rhinitis with nonallergic component 06/17/2016  . PVC (premature ventricular contraction)   . Sinusitis, maxillary, chronic 11/09/2012    Past Surgical History:  Procedure Laterality Date  . A-FLUTTER ABLATION N/A 05/08/2019   Procedure: A-FLUTTER ABLATION;  Surgeon: [name], MD;  Location: [facility];  Service: Cardiovascular;   Laterality: N/A;  . ESOPHAGOGASTRODUODENOSCOPY (EGD) WITH PROPOFOL Left 10/19/2019   Procedure: ESOPHAGOGASTRODUODENOSCOPY (EGD) WITH PROPOFOL;  Surgeon: [name], MD;  Location: [facility] ENDOSCOPY;  Service: Endoscopy;  Laterality: Left;  . HEMORRHOID SURGERY    . right ankle surgery    . right hip replacement    . right kidney removed  2000   for renal ca  . SINOSCOPY      Family History  Problem Relation Age of Onset  . Hypertension Mother   . Diabetes Mother   . Colon cancer Sister   . Lung disease Neg Hx   . Allergic rhinitis Neg Hx   . Angioedema Neg Hx   . Asthma Neg Hx   . Eczema Neg Hx   . Immunodeficiency Neg Hx   . Urticaria Neg Hx     Social History   Socioeconomic History  . Marital status: Married    Spouse name: Not on file  . Number of children: 3  . Years of education: Not on file  . Highest education level: Not on file  Occupational History  . Occupation: Retired    Comment: Patent attorney  Tobacco Use  . Smoking status: Never Smoker  . Smokeless tobacco: Never Used  Substance and Sexual Activity  . Alcohol use: Yes    Alcohol/week: 0.0 standard drinks    Comment: 1-2 drinks/month  . Drug  use: No  . Sexual activity: Not on file  Other Topics Concern  . Not on file  Social History Narrative   Originally from [place]. Moved to [place] in 1996. Has also lived in [place] state. Has traveled to [place], [place], [place], [place], [place], [place], [place], [place], [place], [place], [place], [place], [place], [place], [place], [place], [place], [place], & [place]. 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 No pets currently. No bird, mold or hot tub exposure. Enjoys exercising regularly, fishing & traveling.    Social Determinants of Health   Financial Resource Strain:   . Difficulty of Paying Living Expenses: Not on file  Food Insecurity:   . Worried About Charity fundraiser in the Last Year:  Not on file  . Ran Out of Food in the Last Year: Not on file  Transportation Needs:   . Lack of Transportation (Medical): Not on file  . Lack of Transportation (Non-Medical): Not on file  Physical Activity:   . Days of Exercise per Week: Not on file  . Minutes of Exercise per Session: Not on file  Stress:   . Feeling of Stress : Not on file  Social Connections:   . Frequency of Communication with Friends and Family: Not on file  . Frequency of Social Gatherings with Friends and Family: Not on file  . Attends Religious Services: Not on file  . Active Member of Clubs or Organizations: Not on file  . Attends Archivist Meetings: Not on file  . Marital Status: Not on file  Intimate Partner Violence:   . Fear of Current or Ex-Partner: Not on file  . Emotionally Abused: Not on file  . Physically Abused: Not on file  . Sexually Abused: Not on file     PHYSICAL EXAM:  VS: BP 137/79   Pulse 76   Ht 6\' 1"  (1.854 m)   Wt 230 lb (104.3 kg)   BMI 30.34 kg/m  Physical Exam Gen: NAD, alert, cooperative with exam, well-appearing MSK:  Right and left knee: No obvious effusion. Normal range of motion. Tenderness to palpation over the medial joint line. No instability with valgus varus stress testing. Negative Murray's test. Left hand/wrist: Obvious swelling and some redness over the dorsum of the base of the hand  Limitations with flexion of the fingers. Normal wrist flexion and extension. Back: Right leg: Normal internal and external rotation of the hips. Normal flexion and extension. Negative straight leg raise. Neurovascularly intact   Limited ultrasound: Left hand, right knee, left knee:  Left hand: Obvious effusion emanating from the radius scaphoid joint and involving the fourth dorsal compartment.  Fourth dorsal compartment shows significant effusion as well as hyperechoic debris.  This would suggest a gouty infiltration.  Right knee: No obvious  effusion. Significant narrowing of the medial joint line. Mild degenerative changes of the meniscus medially.  Left knee: No obvious effusion. Moderate medial joint space narrowing and mild degenerative change of the meniscus  Summary: Left hand with findings suggestive of gouty tenosynovitis.  Right and left knee with degenerative changes  Ultrasound and interpretation by Clearance Coots, MD   ASSESSMENT & PLAN:   Primary osteoarthritis of both knees Acutely occurring in nature.  Likely pain is a result of degenerative changes observed.  No significant effusion. -Counseled on home exercise therapy and supportive care. -Could consider injection and imaging.  Acute idiopathic gout of left hand  It appears he has acutely occurring gout in this area.  There was hyperechoic areas on ultrasound.  No history of similar symptoms. - prednisone.  Avoiding NSAIDs with GI bleed and chronic kidney disease. -Uric acid. -Could consider injection if needed.   Low back pain Pain has improved slightly.  Still notices changes of sensation from time to time. -Counseled on home exercise therapy and supportive care. -Continue with physical therapy. -If pain is ongoing since would consider MRI to evaluate for nerve impingement.

## 2019-11-29 NOTE — Assessment & Plan Note (Signed)
Acutely occurring in nature.  Likely pain is a result of degenerative changes observed.  No significant effusion. -Counseled on home exercise therapy and supportive care. -Could consider injection and imaging.

## 2019-11-29 NOTE — Patient Instructions (Signed)
Good to see you Please try the prednisone  Please try physical therapy  I will call with the results   Please send me a message in MyChart with any questions or updates.  Please see me back in 4 weeks or sooner if needed.   --Dr. Raeford Razor

## 2019-11-30 ENCOUNTER — Ambulatory Visit: Payer: Medicare HMO | Attending: Family Medicine | Admitting: Physical Therapy

## 2019-11-30 ENCOUNTER — Telehealth: Payer: Self-pay | Admitting: Family Medicine

## 2019-11-30 ENCOUNTER — Other Ambulatory Visit: Payer: Self-pay

## 2019-11-30 ENCOUNTER — Encounter: Payer: Self-pay | Admitting: Physical Therapy

## 2019-11-30 DIAGNOSIS — M5441 Lumbago with sciatica, right side: Secondary | ICD-10-CM

## 2019-11-30 DIAGNOSIS — R262 Difficulty in walking, not elsewhere classified: Secondary | ICD-10-CM | POA: Diagnosis not present

## 2019-11-30 DIAGNOSIS — R29898 Other symptoms and signs involving the musculoskeletal system: Secondary | ICD-10-CM

## 2019-11-30 LAB — URIC ACID: Uric Acid: 6.9 mg/dL (ref 3.8–8.4)

## 2019-11-30 NOTE — Therapy (Signed)
[facility] [address]  [place], [place], [address] Phone: [phone]   Fax:  [phone]  Physical Therapy Evaluation  Patient Details  Name: Paul Richman Sr. MRN: [id number] Date of Birth: 22-Feb-1946 Referring Provider (PT): Clearance Coots, MD   Encounter Date: 11/30/2019  PT End of Session - 11/30/19 1138    Visit Number  1    Number of Visits  5    Date for PT Re-Evaluation  12/28/19    Authorization Type  [employer]na Medicare    PT Start Time  [phone]    PT Stop Time  1014    PT Time Calculation (min)  36 min    Activity Tolerance  Patient tolerated treatment well    Behavior During Therapy  [facility] for tasks assessed/performed       Past Medical History:  Diagnosis Date  . Abnormal EKG   . Anemia   . Atrial fibrillation ([place])   . CKD (chronic kidney disease), stage III   . Essential hypertension 07/18/2009   Qualifier: Diagnosis of  By: [name], [name]    . Fatigue   . Hypertension   . KIDNEY CANCER 07/18/2009   s/p right neprectomy  . Moderate persistent asthma 07/18/2009       . Nasal polyposis 12/16/2016  . Onychomycosis 12/19/2014  . Pain in lower limb 12/19/2014  . Palpitations   . Perennial allergic rhinitis with nonallergic component 06/17/2016  . PVC (premature ventricular contraction)   . Sinusitis, maxillary, chronic 11/09/2012    Past Surgical History:  Procedure Laterality Date  . A-FLUTTER ABLATION N/A 05/08/2019   Procedure: A-FLUTTER ABLATION;  Surgeon: [name], MD;  Location: [place];  Service: Cardiovascular;  Laterality: N/A;  . ESOPHAGOGASTRODUODENOSCOPY (EGD) WITH PROPOFOL Left 10/19/2019   Procedure: ESOPHAGOGASTRODUODENOSCOPY (EGD) WITH PROPOFOL;  Surgeon: [name], MD;  Location: [facility] ENDOSCOPY;  Service: Endoscopy;  Laterality: Left;  . HEMORRHOID SURGERY    . right ankle surgery    . right hip replacement    . right kidney removed  2000   for renal ca  .  SINOSCOPY      There were no vitals filed for this visit.   Subjective Assessment - 11/30/19 0939    Subjective  Patient reporting LBP for the past month. Denies inciting trauma. Pain is located over R side of posterior hip/LB with radiation down to anterior knee and shin stopping at ankle. Reports slight tingling down this leg without numbness. Worse with laying in any position, sitting, standing, walking. Pain has since improved with Prednisone.    Pertinent History  PVC, asthma, kidney CA, HTN, CKD III, a-fib, anemia, R ankle surgery, R hip replacement, R nephrectomy 2000    Limitations  House hold activities;Walking;Standing;Sitting    How long can you sit comfortably?  couple minutes    How long can you stand comfortably?  unsure    How long can you walk comfortably?  unsure    Diagnostic tests  11/07/19 lumbar xray: Multilevel degenerative changes, greatest at L4-L5    Patient Stated Goals  "strengthen my muscles and get to the root cause of what's going on"    Currently in Pain?  Yes    Pain Score  0-No pain    Pain Location  Hip    Pain Orientation  Right    Pain Descriptors / Indicators  Sharp    Pain Type  Acute pain    Pain  Radiating Towards  down R LE stopping at ankle         [facility] PT Assessment - 11/30/19 0946      Assessment   Medical Diagnosis  Acute R sided LBP with R sided scaitica    Referring Provider (PT)  Clearance Coots, MD    Onset Date/Surgical Date  10/30/19    Prior Therapy  yes      Precautions   Precautions  None      Balance Screen   Has the patient fallen in the past 6 months  Yes    How many times?  1   d/t dizziness   Has the patient had a decrease in activity level because of a fear of falling?   No    Is the patient reluctant to leave their home because of a fear of falling?   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#    Leisure  none      Cognition   Overall Cognitive Status  Within Functional Limits for tasks assessed      Sensation   Light Touch  Appears Intact      Coordination   Gross Motor Movements are Fluid and Coordinated  Yes      Posture/Postural Control   Posture/Postural Control  Postural limitations    Postural Limitations  Rounded Shoulders;Forward head;Weight shift right      ROM / Strength   AROM / PROM / Strength  AROM;Strength      AROM   AROM Assessment Site  Lumbar    Lumbar Flexion  nearly to toes    Lumbar Extension  WFL   tingling in R posterior thigh   Lumbar - Right Side Bend  jt line   slight tingling over R LB   Lumbar - Left Side Bend  jt line    Lumbar - Right Rotation  mildly limited   slight tingling in R LB   Lumbar - Left Rotation  mildly limited   slight tingling in R LB     Strength   Strength Assessment Site  Hip;Knee;Ankle    Right/Left Hip  Right;Left    Right Hip Flexion  4+/5    Right Hip External Rotation   4+/5    Right Hip Internal Rotation  4+/5    Right Hip ABduction  4+/5    Right Hip ADduction  4+/5    Left Hip Flexion  4+/5    Left Hip External Rotation  4+/5    Left Hip Internal Rotation  4+/5    Left Hip ABduction  4+/5    Left Hip ADduction  4+/5    Right/Left Knee  Right;Left    Right Knee Flexion  4+/5    Right Knee Extension  5/5    Left Knee Flexion  4+/5    Left Knee Extension  5/5    Right/Left Ankle  Right;Left    Right Ankle Dorsiflexion  5/5    Right Ankle Plantar Flexion  4+/5    Left Ankle Dorsiflexion  5/5    Left Ankle Plantar Flexion  4+/5      Flexibility   Soft Tissue Assessment /Muscle Length  yes    Hamstrings   WNL    Quadriceps  B WNL in mod thomas position     Piriformis  B mildly tight in KTOS      Palpation   Palpation comment  increased soft tissue restriction in L thoracolumbar paraspinals, R glutes and piriformis, B QL; no TTP      Ambulation/Gait   Assistive device  None    Gait Pattern  Step-through pattern;Within Functional Limits    Ambulation Surface  Level;Indoor    Gait velocity  WFL                Objective measurements completed on examination: See above findings.              PT Education - 11/30/19 1135    Education Details  prognosis, POC, HEP    Person(s) Educated  Patient    Methods  Explanation;Demonstration;Tactile cues;Verbal cues;Handout    Comprehension  Verbalized understanding;Returned demonstration       PT Short Term Goals - 11/30/19 1150      PT SHORT TERM GOAL #1   Title  Patient to be independent with initial HEP.    Time  2    Period  Weeks    Status  New    Target Date  12/14/19        PT Long Term Goals - 11/30/19 1150      PT LONG TERM GOAL #1   Title  Patient to be independent with advanced HEP.    Time  4    Period  Weeks    Status  New    Target Date  12/28/19      PT LONG TERM GOAL #2   Title  Patient to demonstrate lumbar AROM WNL and without sensation of N/T or pain.    Time  4    Period  Weeks    Status  New    Target Date  12/28/19      PT LONG TERM GOAL #3   Title  Patient to demonstrate no tightness in B piriformis.    Time  4    Period  Weeks    Status  New    Target Date  12/28/19      PT LONG TERM GOAL #4   Title  Patient to report 6 hours of sleep without pain limiting for the past week.    Time  4    Period  Weeks    Status  New    Target Date  12/28/19      PT LONG TERM GOAL #5   Title  Patient to report 90% resolution in pain.    Time  4    Period  Weeks    Status  New    Target Date  12/28/19             Plan - 11/30/19 1139    Clinical Impression Statement  Patient is a 73y/o M presenting to OPPT with c/o insidious R  sided LBP/buttock pain for the past month. Pain radiates down to R knee and shin, stopping at ankle with intermittent tingling. Pain has improved since onset with Prednisone. Aggravating factors include supine, prone, sidelying positioning, sitting, standing, and walking. Patient today presenting with slight tingling in R LB with  lumbar AROM, good LE strength, tightness in B piriformis, and soft tissue restriction in L thoracolumbar paraspinals, R glutes and piriformis, and B QL. Patient educated on gentle stretching HEP- patient reported understanding. Would benefit from skilled PT services 1x/week for 4 weeks to address remaining impairments.    Personal Factors and Comorbidities  Age;Comorbidity 3+;Fitness;Past/Current Experience;Profession;Time since onset of injury/illness/exacerbation    Comorbidities  PVC, asthma, kidney CA, HTN, CKD III, a-fib, anemia, R ankle surgery, R hip replacement, R nephrectomy 2000    Examination-Activity Limitations  Sit;Bed Mobility;Sleep;Bend;Squat;Stairs;Carry;Stand;Transfers;Dressing;Hygiene/Grooming;Lift;Locomotion Level    Examination-Participation Restrictions  Church;Cleaning;Shop;Community Activity;Driving;Yard Work;Interpersonal Relationship;Laundry;Meal Prep    Stability/Clinical Decision Making  Stable/Uncomplicated    Clinical Decision Making  Low    Rehab Potential  Good    PT Frequency  1x / week    PT Duration  4 weeks    PT Treatment/Interventions  ADLs/Self Care Home Management;Cryotherapy;Electrical Stimulation;Moist Heat;Traction;Therapeutic exercise;Therapeutic activities;Functional mobility training;Stair training;Gait training;Ultrasound;Neuromuscular re-education;Patient/family education;Manual techniques;Taping;Energy conservation;Dry needling;Passive range of motion    PT Next Visit Plan  reassess HEP; lumbar FOTO    Consulted and Agree with Plan of Care  Patient       Patient will benefit from skilled therapeutic intervention in order to  improve the following deficits and impairments:  Decreased activity tolerance, Decreased strength, Pain, Increased fascial restricitons, Difficulty walking, Increased muscle spasms, Improper body mechanics, Decreased range of motion, Postural dysfunction, Impaired flexibility  Visit Diagnosis: Acute right-sided low back pain with right-sided sciatica  Other symptoms and signs involving the musculoskeletal system  Difficulty in walking, not elsewhere classified     Problem List Patient Active Problem List   Diagnosis Date Noted  . Acute idiopathic gout of left hand 11/29/2019  . Primary osteoarthritis of both knees 11/29/2019  . Low back pain 11/03/2019  . GIB (gastrointestinal bleeding) 10/18/2019  . Chronic anticoagulation 10/18/2019  . Melena 10/18/2019  . Hematemesis 10/18/2019  . Anemia 10/18/2019  . GI bleed 10/18/2019  . Abnormal EKG   . PVC (premature ventricular contraction)   . CKD (chronic kidney disease), stage III   . Unspecified atrial flutter ([place]) 03/28/2019  . PAC (premature atrial contraction) 12/21/2018  . Nasal polyposis 12/16/2016  . Perennial allergic rhinitis with nonallergic component 06/17/2016  . Onychomycosis 12/19/2014  . Pain in lower limb 12/19/2014  . Sinusitis, maxillary, chronic 11/09/2012  . KIDNEY CANCER 07/18/2009  . Essential hypertension 07/18/2009  . Moderate persistent asthma 07/18/2009    Janene Harvey, PT, DPT 11/30/19 11:55 AM   [facility] [address]  [place], [place], [address] Phone: [phone]   Fax:  [phone]  Name: Paul Cipollone Sr. MRN: [id number] Date of Birth: 1945-11-16

## 2019-11-30 NOTE — Telephone Encounter (Signed)
Informed of results.   Rosemarie Ax, MD [facility] 11/30/2019, 8:00 AM

## 2019-12-06 ENCOUNTER — Ambulatory Visit: Payer: Medicare HMO | Attending: Family Medicine | Admitting: Physical Therapy

## 2019-12-06 ENCOUNTER — Encounter: Payer: Self-pay | Admitting: Physical Therapy

## 2019-12-06 ENCOUNTER — Other Ambulatory Visit: Payer: Self-pay

## 2019-12-06 DIAGNOSIS — R29898 Other symptoms and signs involving the musculoskeletal system: Secondary | ICD-10-CM | POA: Diagnosis not present

## 2019-12-06 DIAGNOSIS — M5441 Lumbago with sciatica, right side: Secondary | ICD-10-CM

## 2019-12-06 DIAGNOSIS — R262 Difficulty in walking, not elsewhere classified: Secondary | ICD-10-CM

## 2019-12-06 NOTE — Therapy (Signed)
[facility] [address]  [place], [place], [address] Phone: [phone]   Fax:  [phone]  Physical Therapy Treatment  Patient Details  Name: Paul Karlen Sr. MRN: [id number] Date of Birth: December 29, 1945 Referring Provider (PT): Clearance Coots, MD   Encounter Date: 12/06/2019  PT End of Session - 12/06/19 0927    Visit Number  2    Number of Visits  5    Date for PT Re-Evaluation  12/28/19    Authorization Type  Aetna Medicare    PT Start Time  [phone]    PT Stop Time  0927    PT Time Calculation (min)  41 min    Activity Tolerance  Patient tolerated treatment well    Behavior During Therapy  [facility] for tasks assessed/performed       Past Medical History:  Diagnosis Date  . Abnormal EKG   . Anemia   . Atrial fibrillation ([place])   . CKD (chronic kidney disease), stage III   . Essential hypertension 07/18/2009   Qualifier: Diagnosis of  By: [name], [name]    . Fatigue   . Hypertension   . KIDNEY CANCER 07/18/2009   s/p right neprectomy  . Moderate persistent asthma 07/18/2009       . Nasal polyposis 12/16/2016  . Onychomycosis 12/19/2014  . Pain in lower limb 12/19/2014  . Palpitations   . Perennial allergic rhinitis with nonallergic component 06/17/2016  . PVC (premature ventricular contraction)   . Sinusitis, maxillary, chronic 11/09/2012    Past Surgical History:  Procedure Laterality Date  . A-FLUTTER ABLATION N/A 05/08/2019   Procedure: A-FLUTTER ABLATION;  Surgeon: [name], MD;  Location: [place] [facility];  Service: Cardiovascular;  Laterality: N/A;  . ESOPHAGOGASTRODUODENOSCOPY (EGD) WITH PROPOFOL Left 10/19/2019   Procedure: ESOPHAGOGASTRODUODENOSCOPY (EGD) WITH PROPOFOL;  Surgeon: [name], MD;  Location[facility];  Service: Endoscopy;  Laterality: Left;  . HEMORRHOID SURGERY    . right ankle surgery    . right hip replacement    . right kidney removed  2000   for renal ca  .  SINOSCOPY      There were no vitals filed for this visit.  Subjective Assessment - 12/06/19 0847    Subjective  Not sure what he did, but is having some increased pain in his LB. No trouble or questions with HEP.    Pertinent History  PVC, asthma, kidney CA, HTN, CKD III, a-fib, anemia, R ankle surgery, R hip replacement, R nephrectomy 2000    Diagnostic tests  11/07/19 lumbar xray: Multilevel degenerative changes, greatest at L4-L5    Patient Stated Goals  "strengthen my muscles and get to the root cause of what's going on"    Currently in Pain?  Yes    Pain Score  7     Pain Location  Back    Pain Orientation  Right    Pain Descriptors / Indicators  Aching    Pain Type  Acute pain         [facility] PT Assessment - 12/06/19 0001      Observation/Other Assessments   Focus on Therapeutic Outcomes (FOTO)   Lumbar: 94 (6% limited, 1% predicted)                   [facility] Adult PT Treatment/Exercise - 12/06/19 0001      Exercises   Exercises  Lumbar;Knee/Hip      Lumbar Exercises: Stretches  Piriformis Stretch  Right;Left;1 rep;30 seconds    Piriformis Stretch Limitations  KTOS in supine    Other Lumbar Stretch Exercise  R QL stretch in doorway 2x30"    Other Lumbar Stretch Exercise  R/L supine sciatic glide x20 each LE      Lumbar Exercises: Aerobic   Nustep  L4 x 6 min (LEs only)      Lumbar Exercises: Seated   Other Seated Lumbar Exercises  prayer stretch with green pball 5x3" each to forward/R/L to tolerance      Lumbar Exercises: Supine   Bridge with clamshell  10 reps   red TB above knees     Lumbar Exercises: Sidelying   Other Sidelying Lumbar Exercises  open book stretch x10 to each side       Manual Therapy   Manual Therapy  Soft tissue mobilization;Myofascial release    Manual therapy comments  L sidelying    Soft tissue mobilization  STM to R QL, lumbar paraspinals, piriformis, and prozimal glutes- TTP in QL    Myofascial Release  manual TPR to R  piriformis- tightness but no TTP             PT Education - 12/06/19 0927    Education Details  update to HEP    Person(s) Educated  Patient    Methods  Explanation;Demonstration;Tactile cues;Verbal cues;Handout    Comprehension  Verbalized understanding;Returned demonstration       PT Short Term Goals - 12/06/19 8182      PT SHORT TERM GOAL #1   Title  Patient to be independent with initial HEP.    Time  2    Period  Weeks    Status  On-going    Target Date  12/14/19        PT Long Term Goals - 12/06/19 0929      PT LONG TERM GOAL #1   Title  Patient to be independent with advanced HEP.    Time  4    Period  Weeks    Status  On-going      PT LONG TERM GOAL #2   Title  Patient to demonstrate lumbar AROM WNL and without sensation of N/T or pain.    Time  4    Period  Weeks    Status  On-going      PT LONG TERM GOAL #3   Title  Patient to demonstrate no tightness in B piriformis.    Time  4    Period  Weeks    Status  On-going      PT LONG TERM GOAL #4   Title  Patient to report 6 hours of sleep without pain limiting for the past week.    Time  4    Period  Weeks    Status  On-going      PT LONG TERM GOAL #5   Title  Patient to report 90% resolution in pain.    Time  4    Period  Weeks    Status  On-going            Plan - 12/06/19 9937    Clinical Impression Statement  Patient reported increase in R LBP today without known cause. Noting no questions or trouble with HEP. Reviewed HEP with patient requiring minor cues for form. Demonstrated limited ROM with bridges, suggesting glute weakness. Initiated open book stretch with visible limitation in ROM- patient only reporting "stretch" sensation rather than pain. Addressed LBP  today with STM and TPR, with patient reporting tenderness to R QL and demonstrating soft tissue restriction in R piriformis. Updated HEP with exercise that was well-tolerated today. Patient reported understanding. Noted decrease  in pain levels to 3/10 at end of session.    Comorbidities  PVC, asthma, kidney CA, HTN, CKD III, a-fib, anemia, R ankle surgery, R hip replacement, R nephrectomy 2000    PT Treatment/Interventions  ADLs/Self Care Home Management;Cryotherapy;Electrical Stimulation;Moist Heat;Traction;Therapeutic exercise;Therapeutic activities;Functional mobility training;Stair training;Gait training;Ultrasound;Neuromuscular re-education;Patient/family education;Manual techniques;Taping;Energy conservation;Dry needling;Passive range of motion    PT Next Visit Plan  progress LE stretching and lumbopelvic ROM    Consulted and Agree with Plan of Care  Patient       Patient will benefit from skilled therapeutic intervention in order to improve the following deficits and impairments:  Decreased activity tolerance, Decreased strength, Pain, Increased fascial restricitons, Difficulty walking, Increased muscle spasms, Improper body mechanics, Decreased range of motion, Postural dysfunction, Impaired flexibility  Visit Diagnosis: Acute right-sided low back pain with right-sided sciatica  Other symptoms and signs involving the musculoskeletal system  Difficulty in walking, not elsewhere classified     Problem List Patient Active Problem List   Diagnosis Date Noted  . Acute idiopathic gout of left hand 11/29/2019  . Primary osteoarthritis of both knees 11/29/2019  . Low back pain 11/03/2019  . GIB (gastrointestinal bleeding) 10/18/2019  . Chronic anticoagulation 10/18/2019  . Melena 10/18/2019  . Hematemesis 10/18/2019  . Anemia 10/18/2019  . GI bleed 10/18/2019  . Abnormal EKG   . PVC (premature ventricular contraction)   . CKD (chronic kidney disease), stage III   . Unspecified atrial flutter ([place]) 03/28/2019  . PAC (premature atrial contraction) 12/21/2018  . Nasal polyposis 12/16/2016  . Perennial allergic rhinitis with nonallergic component 06/17/2016  . Onychomycosis 12/19/2014  . Pain in lower  limb 12/19/2014  . Sinusitis, maxillary, chronic 11/09/2012  . KIDNEY CANCER 07/18/2009  . Essential hypertension 07/18/2009  . Moderate persistent asthma 07/18/2009     Janene Harvey, PT, DPT 12/06/19 9:30 AM   [facility] [address]  [place], [place], [address] Phone: [phone]   Fax:  [phone]  Name: Paul Mccreedy Sr. MRN: [id number] Date of Birth: [birth year]

## 2019-12-08 ENCOUNTER — Ambulatory Visit: Payer: Medicare HMO | Attending: Internal Medicine

## 2019-12-08 DIAGNOSIS — Z23 Encounter for immunization: Secondary | ICD-10-CM | POA: Insufficient documentation

## 2019-12-08 NOTE — Progress Notes (Signed)
   [id number]  Name:  Paul Fontanilla Sr.    MRN: [id number] DOB: 10-07-1945  12/08/2019  Paul Roach was observed post Covid-19 immunization for 15 minutes without incident. He was provided with Vaccine Information Sheet and instruction to access the V-Safe system.   Paul Roach was instructed to call 911 with any severe reactions post vaccine: Marland Kitchen Difficulty breathing  . Swelling of face and throat  . A fast heartbeat  . A bad rash all over body  . Dizziness and weakness   Immunizations Administered    Name Date Dose VIS Date Route   Pfizer COVID-19 Vaccine 12/08/2019  2:32 PM 0.3 mL 09/15/2019 Intramuscular   Manufacturer: [place]   Lot: [id number]   Lares: [address]-6

## 2019-12-11 DIAGNOSIS — D649 Anemia, unspecified: Secondary | ICD-10-CM | POA: Diagnosis not present

## 2019-12-12 ENCOUNTER — Other Ambulatory Visit: Payer: Self-pay

## 2019-12-12 ENCOUNTER — Ambulatory Visit: Payer: Medicare HMO | Admitting: Internal Medicine

## 2019-12-12 ENCOUNTER — Encounter: Payer: Self-pay | Admitting: Internal Medicine

## 2019-12-12 VITALS — BP 140/74 | HR 81 | Ht 73.0 in | Wt 231.0 lb

## 2019-12-12 DIAGNOSIS — I10 Essential (primary) hypertension: Secondary | ICD-10-CM | POA: Diagnosis not present

## 2019-12-12 DIAGNOSIS — I4892 Unspecified atrial flutter: Secondary | ICD-10-CM | POA: Diagnosis not present

## 2019-12-12 DIAGNOSIS — I493 Ventricular premature depolarization: Secondary | ICD-10-CM | POA: Diagnosis not present

## 2019-12-12 NOTE — Progress Notes (Signed)
HPI [name] returns today for followup of atrial flutter and HTN,s/p ablation. He is frustrated by his inability to work out though he is considering starting back. He denies chest pain, sob, or palpitations. No edema.  Allergies  Allergen Reactions  . Altace [Ramipril] Other (See Comments)    Patient is unaware of allergy  . Cozaar [Losartan Potassium] Other (See Comments)    Patient is unaware of allergy     Current Outpatient Medications  Medication Sig Dispense Refill  . amLODipine (NORVASC) 5 MG tablet Take 5 mg by mouth every evening.     Marland Kitchen apixaban (ELIQUIS) 5 MG TABS tablet Take 5 mg by mouth 2 (two) times daily.    . budesonide-formoterol (SYMBICORT) 160-4.5 MCG/ACT inhaler Inhale 2 puffs into the lungs 2 (two) times daily.    . hydrALAZINE (APRESOLINE) 100 MG tablet Take 100 mg by mouth 3 (three) times daily.    . hydrochlorothiazide (HYDRODIURIL) 12.5 MG tablet Take 12.5 mg by mouth every Monday, Wednesday, and Friday.     Marland Kitchen HYDROcodone-acetaminophen (NORCO/VICODIN) 5-325 MG tablet Take 1 tablet by mouth every 8 (eight) hours as needed for moderate pain. 15 tablet 0  . metoprolol tartrate (LOPRESSOR) 25 MG tablet Take 1.5 tablets (37.5 mg total) by mouth 2 (two) times daily. 270 tablet 3  . pantoprazole (PROTONIX) 40 MG tablet Take 1 tablet (40 mg total) by mouth 2 (two) times daily before a meal. 84 tablet 0   No current facility-administered medications for this visit.     Past Medical History:  Diagnosis Date  . Abnormal EKG   . Anemia   . Atrial fibrillation ([place])   . CKD (chronic kidney disease), stage III   . Essential hypertension 07/18/2009   Qualifier: Diagnosis of  By: [name], [name]    . Fatigue   . Hypertension   . KIDNEY CANCER 07/18/2009   s/p right neprectomy  . Moderate persistent asthma 07/18/2009       . Nasal polyposis 12/16/2016  . Onychomycosis 12/19/2014  . Pain in lower limb 12/19/2014  . Palpitations   . Perennial allergic  rhinitis with nonallergic component 06/17/2016  . PVC (premature ventricular contraction)   . Sinusitis, maxillary, chronic 11/09/2012    ROS:   All systems reviewed and negative except as noted in the HPI.   Past Surgical History:  Procedure Laterality Date  . A-FLUTTER ABLATION N/A 05/08/2019   Procedure: A-FLUTTER ABLATION;  Surgeon: [name], MD;  Location: [facility];  Service: Cardiovascular;  Laterality: N/A;  . ESOPHAGOGASTRODUODENOSCOPY (EGD) WITH PROPOFOL Left 10/19/2019   Procedure: ESOPHAGOGASTRODUODENOSCOPY (EGD) WITH PROPOFOL;  Surgeon: [name], MD;  Location: [facility];  Service: Endoscopy;  Laterality: Left;  . HEMORRHOID SURGERY    . right ankle surgery    . right hip replacement    . right kidney removed  2000   for renal ca  . SINOSCOPY       Family History  Problem Relation Age of Onset  . Hypertension Mother   . Diabetes Mother   . Colon cancer Sister   . Lung disease Neg Hx   . Allergic rhinitis Neg Hx   . Angioedema Neg Hx   . Asthma Neg Hx   . Eczema Neg Hx   . Immunodeficiency Neg Hx   . Urticaria Neg Hx      Social History   Socioeconomic History  . Marital status: Married    Spouse name:  Not on file  . Number of children: 3  . Years of education: Not on file  . Highest education level: Not on file  Occupational History  . Occupation: Retired    Comment: Patent attorney  Tobacco Use  . Smoking status: Never Smoker  . Smokeless tobacco: Never Used  Substance and Sexual Activity  . Alcohol use: Yes    Alcohol/week: 0.0 standard drinks    Comment: 1-2 drinks/month  . Drug use: No  . Sexual activity: Not on file  Other Topics Concern  . Not on file  Social History Narrative   Originally from [place]. Moved to [place] in 1996. Has also lived in [place]. Has traveled to [place], [place], [place], [place], [place], [place], [place], [place], [place], [place], [place], [place], [place], [place], [place], [place], [place], [place], & [place]. 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 No pets currently. No bird, mold or hot tub exposure. Enjoys exercising regularly, fishing & traveling.    Social Determinants of Health   Financial Resource Strain:   . Difficulty of Paying Living Expenses: Not on file  Food Insecurity:   . Worried About Charity fundraiser in the Last Year: Not on file  . Ran Out of Food in the Last Year: Not on file  Transportation Needs:   . Lack of Transportation (Medical): Not on file  . Lack of Transportation (Non-Medical): Not on file  Physical Activity:   . Days of Exercise per Week: Not on file  . Minutes of Exercise per Session: Not on file  Stress:   . Feeling of Stress : Not on file  Social Connections:   . Frequency of Communication with Friends and Family: Not on file  . Frequency of Social Gatherings with Friends and Family: Not on file  . Attends Religious Services: Not on file  . Active Member of Clubs or Organizations: Not on file  . Attends Archivist Meetings: Not on file  . Marital Status: Not on file  Intimate Partner Violence:   . Fear of Current or Ex-Partner: Not on file  . Emotionally Abused: Not on file  . Physically Abused: Not on file  . Sexually Abused: Not on file     BP 140/74   Pulse 81   Ht 6\' 1"  (1.854 m)   Wt 231 lb (104.8 kg)   SpO2 99%   BMI 30.48 kg/m   Physical Exam:  Well appearing NAD HEENT: Unremarkable Neck:  No JVD, no thyromegally Lymphatics:  No adenopathy Back:  No CVA tenderness Lungs:  Clear with no wheezes HEART:  Regular rate rhythm, no murmurs, no rubs, no clicks Abd:  soft, positive bowel sounds, no organomegally, no rebound, no guarding Ext:  2 plus pulses, no edema, no cyanosis, no clubbing Skin:  No rashes no nodules Neuro:  CN II through XII intact, motor grossly intact   Assess/Plan: 1. Atrial flutter - he is s/p ablation and had no  recurrence. At this point I think his thromboembolic risk is low. I have recommended he stop eliquis. I have asked him to check his bp regularly and if his HR goes over 100/min, then he will call us to discuss treatment options.  2. HTN - his SBP is up a bit but this is mostly well controlled.   [name].D.

## 2019-12-12 NOTE — Patient Instructions (Addendum)
Medication Instructions:  Your physician has recommended you make the following change in your medication:   1.  STOP taking ELIQUIS  Call office if heart rate greater than 100 beats per minute  Labwork: None ordered.  Testing/Procedures: None ordered.  Follow-Up: Your physician wants you to follow-up in: one year with Dr. Lovena Le.  You will receive a reminder letter in the mail two months in advance. If you don't receive a letter, please call our office to schedule the follow-up appointment.  Any Other Special Instructions Will Be Listed Below (If Applicable).  If you need a refill on your cardiac medications before your next appointment, please call your pharmacy.

## 2019-12-13 ENCOUNTER — Ambulatory Visit: Payer: Medicare HMO

## 2019-12-13 DIAGNOSIS — R262 Difficulty in walking, not elsewhere classified: Secondary | ICD-10-CM | POA: Diagnosis not present

## 2019-12-13 DIAGNOSIS — R29898 Other symptoms and signs involving the musculoskeletal system: Secondary | ICD-10-CM

## 2019-12-13 DIAGNOSIS — M5441 Lumbago with sciatica, right side: Secondary | ICD-10-CM | POA: Diagnosis not present

## 2019-12-13 NOTE — Therapy (Addendum)
[facility] [address]  [place], [place], [address] Phone: [phone]   Fax:  [phone]  Physical Therapy Treatment  Patient Details  Name: Paul Amory Sr. MRN: [id number] Date of Birth: 08/27/1946 Referring Provider (PT): Clearance Coots, MD   Progress Note Reporting Period 11/30/19 to 12/13/19  See note below for Objective Data and Assessment of Progress/Goals.     Encounter Date: 12/13/2019  PT End of Session - 12/13/19 0939    Visit Number  3    Number of Visits  5    Date for PT Re-Evaluation  12/28/19    Authorization Type  [employer] Medicare    PT Start Time  [phone]    PT Stop Time  1012    PT Time Calculation (min)  41 min    Activity Tolerance  Patient tolerated treatment well    Behavior During Therapy  [facility] for tasks assessed/performed       Past Medical History:  Diagnosis Date  . Abnormal EKG   . Anemia   . Atrial fibrillation ([place])   . CKD (chronic kidney disease), stage III   . Essential hypertension 07/18/2009   Qualifier: Diagnosis of  By: [name], [name]    . Fatigue   . Hypertension   . KIDNEY CANCER 07/18/2009   s/p right neprectomy  . Moderate persistent asthma 07/18/2009       . Nasal polyposis 12/16/2016  . Onychomycosis 12/19/2014  . Pain in lower limb 12/19/2014  . Palpitations   . Perennial allergic rhinitis with nonallergic component 06/17/2016  . PVC (premature ventricular contraction)   . Sinusitis, maxillary, chronic 11/09/2012    Past Surgical History:  Procedure Laterality Date  . A-FLUTTER ABLATION N/A 05/08/2019   Procedure: A-FLUTTER ABLATION;  Surgeon: [name], MD;  Location: [facility];  Service: Cardiovascular;  Laterality: N/A;  . ESOPHAGOGASTRODUODENOSCOPY (EGD) WITH PROPOFOL Left 10/19/2019   Procedure: ESOPHAGOGASTRODUODENOSCOPY (EGD) WITH PROPOFOL;  Surgeon: [name], MD;  Location: [facility] ENDOSCOPY;  Service: Endoscopy;  Laterality: Left;   . HEMORRHOID SURGERY    . right ankle surgery    . right hip replacement    . right kidney removed  2000   for renal ca  . SINOSCOPY      There were no vitals filed for this visit.  Subjective Assessment - 12/13/19 0936    Subjective  Pt. reporting pain intensity up to 3-4/10 at times however pain remains "sporatic"    Pertinent History  PVC, asthma, kidney CA, HTN, CKD III, a-fib, anemia, R ankle surgery, R hip replacement, R nephrectomy 2000    Diagnostic tests  11/07/19 lumbar xray: Multilevel degenerative changes, greatest at L4-L5    Patient Stated Goals  "strengthen my muscles and get to the root cause of what's going on"    Currently in Pain?  No/denies    Pain Score  0-No pain   pain rising to 3-4/10   Pain Location  Back    Pain Orientation  Right    Pain Descriptors / Indicators  Aching    Pain Type  Acute pain    Pain Radiating Towards  down R lateral LE stopping    Multiple Pain Sites  No                       [facility] Adult PT Treatment/Exercise - 12/13/19 0001      Self-Care   Self-Care  Other  Self-Care Comments    Other Self-Care Comments   ball self-massage to R buttocks on wall       Lumbar Exercises: Stretches   Passive Hamstring Stretch  2 reps;30 seconds    Passive Hamstring Stretch Limitations  supine with strap     Lower Trunk Rotation Limitations  5" x 10 reps     Figure 4 Stretch  2 reps;30 seconds    Figure 4 Stretch Limitations  Strap Figure-4 B      Lumbar Exercises: Aerobic   Nustep  L5 x 6 min (LEs only)      Lumbar Exercises: Supine   Bridge with clamshell  15 reps;3 seconds   green TB at knees      Lumbar Exercises: Sidelying   Other Sidelying Lumbar Exercises  open book stretch x10 to each side       Lumbar Exercises: Quadruped   Straight Leg Raise  10 reps;3 seconds    Straight Leg Raises Limitations  quadruped              PT Education - 12/13/19 1020    Education Details  HEP update;  quadruped LE raise     Person(s) Educated  Patient    Methods  Explanation;Demonstration;Verbal cues;Handout    Comprehension  Verbalized understanding;Returned demonstration;Verbal cues required       PT Short Term Goals - 12/13/19 1110      PT SHORT TERM GOAL #1   Title  Patient to be independent with initial HEP.    Time  2    Period  Weeks    Status  Achieved    Target Date  12/14/19        PT Long Term Goals - 12/06/19 0929      PT LONG TERM GOAL #1   Title  Patient to be independent with advanced HEP.    Time  4    Period  Weeks    Status  On-going      PT LONG TERM GOAL #2   Title  Patient to demonstrate lumbar AROM WNL and without sensation of N/T or pain.    Time  4    Period  Weeks    Status  On-going      PT LONG TERM GOAL #3   Title  Patient to demonstrate no tightness in B piriformis.    Time  4    Period  Weeks    Status  On-going      PT LONG TERM GOAL #4   Title  Patient to report 6 hours of sleep without pain limiting for the past week.    Time  4    Period  Weeks    Status  On-going      PT LONG TERM GOAL #5   Title  Patient to report 90% resolution in pain.    Time  4    Period  Weeks    Status  On-going            Plan - 12/13/19 0945    Clinical Impression Statement  Eliab reporting reduction in R LE radicular pain and improving intensity of LBP recently.  Progressed extension-based strengthening with quadruped LE kickbacks and instructed pt. in self-tennis ball release to R buttocks with pt. noting muscular tenderness has also improved over this past week.  Ended session with HEP update and handout issued to pt.  Pt. reporting he is performing HEP daily without questions.  STG #1 achieved.  Comorbidities  PVC, asthma, kidney CA, HTN, CKD III, a-fib, anemia, R ankle surgery, R hip replacement, R nephrectomy 2000    Rehab Potential  Good    PT Treatment/Interventions  ADLs/Self Care Home Management;Cryotherapy;Electrical Stimulation;Moist  Heat;Traction;Therapeutic exercise;Therapeutic activities;Functional mobility training;Stair training;Gait training;Ultrasound;Neuromuscular re-education;Patient/family education;Manual techniques;Taping;Energy conservation;Dry needling;Passive range of motion    PT Next Visit Plan  machine strengthening per pt. request; progress LE stretching and lumbopelvic ROM    Consulted and Agree with Plan of Care  Patient       Patient will benefit from skilled therapeutic intervention in order to improve the following deficits and impairments:  Decreased activity tolerance, Decreased strength, Pain, Increased fascial restricitons, Difficulty walking, Increased muscle spasms, Improper body mechanics, Decreased range of motion, Postural dysfunction, Impaired flexibility  Visit Diagnosis: Acute right-sided low back pain with right-sided sciatica  Other symptoms and signs involving the musculoskeletal system  Difficulty in walking, not elsewhere classified     Problem List Patient Active Problem List   Diagnosis Date Noted  . Acute idiopathic gout of left hand 11/29/2019  . Primary osteoarthritis of both knees 11/29/2019  . Low back pain 11/03/2019  . GIB (gastrointestinal bleeding) 10/18/2019  . Chronic anticoagulation 10/18/2019  . Melena 10/18/2019  . Hematemesis 10/18/2019  . Anemia 10/18/2019  . GI bleed 10/18/2019  . Abnormal EKG   . PVC (premature ventricular contraction)   . CKD (chronic kidney disease), stage III   . Unspecified atrial flutter ([place]) 03/28/2019  . PAC (premature atrial contraction) 12/21/2018  . Nasal polyposis 12/16/2016  . Perennial allergic rhinitis with nonallergic component 06/17/2016  . Onychomycosis 12/19/2014  . Pain in lower limb 12/19/2014  . Sinusitis, maxillary, chronic 11/09/2012  . KIDNEY CANCER 07/18/2009  . Essential hypertension 07/18/2009  . Moderate persistent asthma 07/18/2009    Bess Harvest, PTA 12/13/19 11:12 AM   [facility] [address]  [place], [place], [address] Phone: [phone]   Fax:  [phone]  Name: Loc Feinstein Sr. MRN: [id number] Date of Birth: Apr 27, 1946   PHYSICAL THERAPY DISCHARGE SUMMARY  Visits from Start of Care: 3  Current functional level related to goals / functional outcomes: Unable to assess; patient did not return d/t feeling better   Remaining deficits: Unable to assess   Education / Equipment: HEP  Plan: Patient agrees to discharge.  Patient goals were not met. Patient is being discharged due to not returning since the last visit.  ?????     Janene Harvey, PT, DPT 01/31/20 3:14 PM

## 2019-12-19 ENCOUNTER — Telehealth: Payer: Self-pay | Admitting: Internal Medicine

## 2019-12-19 DIAGNOSIS — I10 Essential (primary) hypertension: Secondary | ICD-10-CM | POA: Diagnosis not present

## 2019-12-19 DIAGNOSIS — N183 Chronic kidney disease, stage 3 unspecified: Secondary | ICD-10-CM | POA: Diagnosis not present

## 2019-12-19 DIAGNOSIS — Z125 Encounter for screening for malignant neoplasm of prostate: Secondary | ICD-10-CM | POA: Diagnosis not present

## 2019-12-19 DIAGNOSIS — M7989 Other specified soft tissue disorders: Secondary | ICD-10-CM | POA: Diagnosis not present

## 2019-12-19 DIAGNOSIS — Z0000 Encounter for general adult medical examination without abnormal findings: Secondary | ICD-10-CM | POA: Diagnosis not present

## 2019-12-19 DIAGNOSIS — J45998 Other asthma: Secondary | ICD-10-CM | POA: Diagnosis not present

## 2019-12-19 DIAGNOSIS — M6740 Ganglion, unspecified site: Secondary | ICD-10-CM | POA: Diagnosis not present

## 2019-12-19 DIAGNOSIS — E7800 Pure hypercholesterolemia, unspecified: Secondary | ICD-10-CM | POA: Diagnosis not present

## 2019-12-19 DIAGNOSIS — M79643 Pain in unspecified hand: Secondary | ICD-10-CM | POA: Diagnosis not present

## 2019-12-19 NOTE — Telephone Encounter (Signed)
Returned call to Pt.  Advised per nurse at PCP office, pulse at wrist was 40.  PCP auscultated 80 d/t Pt with frequent PVC's.  Pt is asymptomatic.  Pt states just wants to keep [place] informed.  Advised if he is not having any symptoms that he is doing well.

## 2019-12-19 NOTE — Telephone Encounter (Signed)
STAT if HR is under 50 or over 120 (normal HR is 60-100 beats per minute)  1) What is your heart rate? 39  2) Do you have a log of your heart rate readings (document readings)? 38, 39 but most time it is in the 70's and 80's.  3) Do you have any other symptoms? Patient went to PCP today for annual physical, BP is good 132/66 but HR was 38-39. Patient concerned about how low HR was.

## 2019-12-19 NOTE — Telephone Encounter (Signed)
The patient reports he went to his PCP today and his BP was 132/66 and HR 38-39, when his HR is usually 70-80. He has no symptoms and specifically denies dizziness, lightheadedness, and feeling like he may faint. Confirmed he takes metoprolol 37.5 mg BID. He took his AM dose at 0730 today.  He takes his PM metop dose around 2000.  Instructed him to hold metoprolol until he has instructions from Dr. Lovena Le.  He will call prior to that time if symptoms occur.

## 2019-12-20 ENCOUNTER — Ambulatory Visit: Payer: Medicare HMO

## 2019-12-27 ENCOUNTER — Ambulatory Visit: Payer: Medicare HMO | Admitting: Family Medicine

## 2019-12-27 ENCOUNTER — Ambulatory Visit: Payer: Medicare HMO | Admitting: Physical Therapy

## 2020-01-03 DIAGNOSIS — R5382 Chronic fatigue, unspecified: Secondary | ICD-10-CM | POA: Diagnosis not present

## 2020-01-03 DIAGNOSIS — M7989 Other specified soft tissue disorders: Secondary | ICD-10-CM | POA: Diagnosis not present

## 2020-01-03 DIAGNOSIS — M2550 Pain in unspecified joint: Secondary | ICD-10-CM | POA: Diagnosis not present

## 2020-01-03 DIAGNOSIS — E669 Obesity, unspecified: Secondary | ICD-10-CM | POA: Diagnosis not present

## 2020-01-03 DIAGNOSIS — Z6830 Body mass index (BMI) 30.0-30.9, adult: Secondary | ICD-10-CM | POA: Diagnosis not present

## 2020-01-11 ENCOUNTER — Encounter (HOSPITAL_BASED_OUTPATIENT_CLINIC_OR_DEPARTMENT_OTHER): Payer: Medicare HMO | Admitting: Internal Medicine

## 2020-01-22 DIAGNOSIS — N183 Chronic kidney disease, stage 3 unspecified: Secondary | ICD-10-CM | POA: Diagnosis not present

## 2020-01-23 DIAGNOSIS — N183 Chronic kidney disease, stage 3 unspecified: Secondary | ICD-10-CM | POA: Diagnosis not present

## 2020-01-23 DIAGNOSIS — E669 Obesity, unspecified: Secondary | ICD-10-CM | POA: Diagnosis not present

## 2020-01-23 DIAGNOSIS — R5382 Chronic fatigue, unspecified: Secondary | ICD-10-CM | POA: Diagnosis not present

## 2020-01-23 DIAGNOSIS — M2550 Pain in unspecified joint: Secondary | ICD-10-CM | POA: Diagnosis not present

## 2020-01-23 DIAGNOSIS — M0579 Rheumatoid arthritis with rheumatoid factor of multiple sites without organ or systems involvement: Secondary | ICD-10-CM | POA: Diagnosis not present

## 2020-01-23 DIAGNOSIS — Z6830 Body mass index (BMI) 30.0-30.9, adult: Secondary | ICD-10-CM | POA: Diagnosis not present

## 2020-01-26 DIAGNOSIS — I10 Essential (primary) hypertension: Secondary | ICD-10-CM | POA: Diagnosis not present

## 2020-01-26 DIAGNOSIS — N390 Urinary tract infection, site not specified: Secondary | ICD-10-CM | POA: Diagnosis not present

## 2020-01-26 DIAGNOSIS — R809 Proteinuria, unspecified: Secondary | ICD-10-CM | POA: Diagnosis not present

## 2020-01-26 DIAGNOSIS — N183 Chronic kidney disease, stage 3 unspecified: Secondary | ICD-10-CM | POA: Diagnosis not present

## 2020-02-12 ENCOUNTER — Other Ambulatory Visit: Payer: Self-pay | Admitting: Gastroenterology

## 2020-02-12 DIAGNOSIS — K254 Chronic or unspecified gastric ulcer with hemorrhage: Secondary | ICD-10-CM

## 2020-02-20 ENCOUNTER — Ambulatory Visit
Admission: RE | Admit: 2020-02-20 | Discharge: 2020-02-20 | Disposition: A | Discharge status: Home / Self Care | Payer: Medicare HMO | Source: Ambulatory Visit | Attending: Gastroenterology | Admitting: Gastroenterology

## 2020-02-20 DIAGNOSIS — K254 Chronic or unspecified gastric ulcer with hemorrhage: Secondary | ICD-10-CM

## 2020-02-20 DIAGNOSIS — K449 Diaphragmatic hernia without obstruction or gangrene: Secondary | ICD-10-CM | POA: Diagnosis not present

## 2020-02-20 DIAGNOSIS — K219 Gastro-esophageal reflux disease without esophagitis: Secondary | ICD-10-CM | POA: Diagnosis not present

## 2020-02-21 DIAGNOSIS — B9681 Helicobacter pylori [H. pylori] as the cause of diseases classified elsewhere: Secondary | ICD-10-CM | POA: Diagnosis not present

## 2020-02-28 DIAGNOSIS — E1169 Type 2 diabetes mellitus with other specified complication: Secondary | ICD-10-CM | POA: Diagnosis not present

## 2020-02-28 DIAGNOSIS — R634 Abnormal weight loss: Secondary | ICD-10-CM | POA: Diagnosis not present

## 2020-03-01 DIAGNOSIS — I10 Essential (primary) hypertension: Secondary | ICD-10-CM | POA: Diagnosis not present

## 2020-03-01 DIAGNOSIS — N183 Chronic kidney disease, stage 3 unspecified: Secondary | ICD-10-CM | POA: Diagnosis not present

## 2020-03-01 DIAGNOSIS — N390 Urinary tract infection, site not specified: Secondary | ICD-10-CM | POA: Diagnosis not present

## 2020-03-03 ENCOUNTER — Emergency Department (HOSPITAL_BASED_OUTPATIENT_CLINIC_OR_DEPARTMENT_OTHER)
Admission: EM | Admit: 2020-03-03 | Discharge: 2020-03-03 | Disposition: A | Discharge status: Home / Self Care | Payer: Medicare HMO | Attending: Emergency Medicine | Admitting: Emergency Medicine

## 2020-03-03 ENCOUNTER — Encounter (HOSPITAL_BASED_OUTPATIENT_CLINIC_OR_DEPARTMENT_OTHER): Payer: Self-pay | Admitting: *Deleted

## 2020-03-03 ENCOUNTER — Other Ambulatory Visit: Payer: Self-pay

## 2020-03-03 DIAGNOSIS — Z905 Acquired absence of kidney: Secondary | ICD-10-CM | POA: Diagnosis not present

## 2020-03-03 DIAGNOSIS — R202 Paresthesia of skin: Secondary | ICD-10-CM | POA: Diagnosis present

## 2020-03-03 DIAGNOSIS — I4891 Unspecified atrial fibrillation: Secondary | ICD-10-CM | POA: Diagnosis not present

## 2020-03-03 DIAGNOSIS — E871 Hypo-osmolality and hyponatremia: Secondary | ICD-10-CM | POA: Diagnosis not present

## 2020-03-03 DIAGNOSIS — D696 Thrombocytopenia, unspecified: Secondary | ICD-10-CM

## 2020-03-03 DIAGNOSIS — Z888 Allergy status to other drugs, medicaments and biological substances status: Secondary | ICD-10-CM | POA: Diagnosis not present

## 2020-03-03 DIAGNOSIS — N183 Chronic kidney disease, stage 3 unspecified: Secondary | ICD-10-CM | POA: Diagnosis not present

## 2020-03-03 DIAGNOSIS — Z85528 Personal history of other malignant neoplasm of kidney: Secondary | ICD-10-CM | POA: Insufficient documentation

## 2020-03-03 DIAGNOSIS — E1122 Type 2 diabetes mellitus with diabetic chronic kidney disease: Secondary | ICD-10-CM | POA: Diagnosis not present

## 2020-03-03 DIAGNOSIS — Y92009 Unspecified place in unspecified non-institutional (private) residence as the place of occurrence of the external cause: Secondary | ICD-10-CM | POA: Diagnosis not present

## 2020-03-03 DIAGNOSIS — E1165 Type 2 diabetes mellitus with hyperglycemia: Secondary | ICD-10-CM

## 2020-03-03 DIAGNOSIS — N189 Chronic kidney disease, unspecified: Secondary | ICD-10-CM

## 2020-03-03 DIAGNOSIS — I129 Hypertensive chronic kidney disease with stage 1 through stage 4 chronic kidney disease, or unspecified chronic kidney disease: Secondary | ICD-10-CM | POA: Insufficient documentation

## 2020-03-03 LAB — URINALYSIS, ROUTINE W REFLEX MICROSCOPIC
Bilirubin Urine: NEGATIVE
Glucose, UA: >=500 mg/dL — AB
Hgb urine dipstick: NEGATIVE
Ketones, ur: NEGATIVE mg/dL
Leukocytes,Ua: NEGATIVE
Nitrite: NEGATIVE
Protein, ur: 30 mg/dL — AB
Specific Gravity, Urine: 1.015 (ref 1.005–1.030)
pH: 6 (ref 5.0–8.0)

## 2020-03-03 LAB — CBC WITH DIFFERENTIAL/PLATELET
Abs Immature Granulocytes: 0.02 10*3/uL (ref 0.00–0.07)
Basophils Absolute: 0 10*3/uL (ref 0.0–0.1)
Basophils Relative: 1 %
Eosinophils Absolute: 0 10*3/uL (ref 0.0–0.5)
Eosinophils Relative: 1 %
HCT: 44.1 % (ref 39.0–52.0)
Hemoglobin: 15.1 g/dL (ref 13.0–17.0)
Immature Granulocytes: 1 %
Lymphocytes Relative: 33 %
Lymphs Abs: 1.4 10*3/uL (ref 0.7–4.0)
MCH: 30.3 pg (ref 26.0–34.0)
MCHC: 34.2 g/dL (ref 30.0–36.0)
MCV: 88.6 fL (ref 80.0–100.0)
Monocytes Absolute: 0.3 10*3/uL (ref 0.1–1.0)
Monocytes Relative: 8 %
Neutro Abs: 2.5 10*3/uL (ref 1.7–7.7)
Neutrophils Relative %: 56 %
Platelets: 100 10*3/uL — ABNORMAL LOW (ref 150–400)
RBC: 4.98 MIL/uL (ref 4.22–5.81)
RDW: 13.8 % (ref 11.5–15.5)
Smear Review: NORMAL
WBC: 4.4 10*3/uL (ref 4.0–10.5)
nRBC: 0 % (ref 0.0–0.2)

## 2020-03-03 LAB — BASIC METABOLIC PANEL
Anion gap: 10 (ref 5–15)
BUN: 24 mg/dL — ABNORMAL HIGH (ref 8–23)
CO2: 27 mmol/L (ref 22–32)
Calcium: 8.9 mg/dL (ref 8.9–10.3)
Chloride: 96 mmol/L — ABNORMAL LOW (ref 98–111)
Creatinine, Ser: 1.64 mg/dL — ABNORMAL HIGH (ref 0.61–1.24)
GFR calc Af Amer: 47 mL/min — ABNORMAL LOW (ref >60)
GFR calc non Af Amer: 40 mL/min — ABNORMAL LOW (ref >60)
Glucose, Bld: 267 mg/dL — ABNORMAL HIGH (ref 70–99)
Potassium: 3.8 mmol/L (ref 3.5–5.1)
Sodium: 133 mmol/L — ABNORMAL LOW (ref 135–145)

## 2020-03-03 LAB — URINALYSIS, MICROSCOPIC (REFLEX)
Squamous Epithelial / HPF: NONE SEEN (ref 0–5)
WBC, UA: NONE SEEN WBC/hpf (ref 0–5)

## 2020-03-03 LAB — CBG MONITORING, ED: Glucose-Capillary: 244 mg/dL — ABNORMAL HIGH (ref 70–99)

## 2020-03-03 NOTE — Discharge Instructions (Addendum)
Monitor your symptoms as discussed and follow-up with your local doctor.  Return for new or worsening symptoms.  Have your platelet number rechecked by primary doctor and less it was similar to 100 last time it was checked.

## 2020-03-03 NOTE — ED Provider Notes (Signed)
[facility]ERG[facility]Y DEPARTMENT Provider Note   CSN: [id number] Arrival date & time: 03/03/20  1312     History Chief Complaint  Patient presents with  . Medication Reaction    Paul Gutman Sr. is a 74 y.o. male.  Patient with history of single kidney due to removal from cancer, high blood pressure history presents with tingly sensation in bilateral lower extremities that he has had from recent diagnosed diabetes in addition to intermittent lightheadedness.  Patient recently was asked to stop taking few medications that are hard on his kidneys including Metformin and prednisone.  Patient feels the symptoms have improved mildly he just want to make sure of any else was okay.  Patient has close follow-up with nephrology and primary doctor.        Past Medical History:  Diagnosis Date  . Abnormal EKG   . Anemia   . Atrial fibrillation ([place])   . CKD (chronic kidney disease), stage III   . Essential hypertension 07/18/2009   Qualifier: Diagnosis of  By: [name], [name]    . Fatigue   . Hypertension   . KIDNEY CANCER 07/18/2009   s/p right neprectomy  . Moderate persistent asthma 07/18/2009       . Nasal polyposis 12/16/2016  . Onychomycosis 12/19/2014  . Pain in lower limb 12/19/2014  . Palpitations   . Perennial allergic rhinitis with nonallergic component 06/17/2016  . PVC (premature ventricular contraction)   . Sinusitis, maxillary, chronic 11/09/2012    Patient Active Problem List   Diagnosis Date Noted  . Acute idiopathic gout of left hand 11/29/2019  . Primary osteoarthritis of both knees 11/29/2019  . Low back pain 11/03/2019  . GIB (gastrointestinal bleeding) 10/18/2019  . Chronic anticoagulation 10/18/2019  . Melena 10/18/2019  . Hematemesis 10/18/2019  . Anemia 10/18/2019  . GI bleed 10/18/2019  . Abnormal EKG   . PVC (premature ventricular contraction)   . CKD (chronic kidney disease), stage III   . Unspecified atrial flutter ([place]) 03/28/2019    . PAC (premature atrial contraction) 12/21/2018  . Nasal polyposis 12/16/2016  . Perennial allergic rhinitis with nonallergic component 06/17/2016  . Onychomycosis 12/19/2014  . Pain in lower limb 12/19/2014  . Sinusitis, maxillary, chronic 11/09/2012  . KIDNEY CANCER 07/18/2009  . Essential hypertension 07/18/2009  . Moderate persistent asthma 07/18/2009    Past Surgical History:  Procedure Laterality Date  . A-FLUTTER ABLATION N/A 05/08/2019   Procedure: A-FLUTTER ABLATION;  Surgeon: [name], MD;  Location: [facility];  Service: Cardiovascular;  Laterality: N/A;  . ESOPHAGOGASTRODUODENOSCOPY (EGD) WITH PROPOFOL Left 10/19/2019   Procedure: ESOPHAGOGASTRODUODENOSCOPY (EGD) WITH PROPOFOL;  Surgeon: [name], MD;  Location[facility];  Service: Endoscopy;  Laterality: Left;  . HEMORRHOID SURGERY    . right ankle surgery    . right hip replacement    . right kidney removed  2000   for renal ca  . SINOSCOPY         Family History  Problem Relation Age of Onset  . Hypertension Mother   . Diabetes Mother   . Colon cancer Sister   . Lung disease Neg Hx   . Allergic rhinitis Neg Hx   . Angioedema Neg Hx   . Asthma Neg Hx   . Eczema Neg Hx   . Immunodeficiency Neg Hx   . Urticaria Neg Hx     Social History   Tobacco Use  . Smoking status: Never Smoker  . Smokeless  tobacco: Never Used  Substance Use Topics  . Alcohol use: Yes    Alcohol/week: 0.0 standard drinks    Comment: 1-2 drinks/month  . Drug use: No    Home Medications Prior to Admission medications   Medication Sig Start Date End Date Taking? Authorizing Provider  amLODipine (NORVASC) 5 MG tablet Take 5 mg by mouth every evening.  11/05/18   [provider]  budesonide-formoterol (SYMBICORT) 160-4.5 MCG/ACT inhaler Inhale 2 puffs into the lungs 2 (two) times daily.    [provider]  hydrALAZINE (APRESOLINE) 100 MG tablet Take 100 mg by mouth 3 (three) times daily.     [provider]  hydrochlorothiazide (HYDRODIURIL) 12.5 MG tablet Take 12.5 mg by mouth every Monday, Wednesday, and Friday.     [provider]  HYDROcodone-acetaminophen (NORCO/VICODIN) 5-325 MG tablet Take 1 tablet by mouth every 8 (eight) hours as needed for moderate pain. 11/03/19   Rosemarie Ax, MD  metoprolol tartrate (LOPRESSOR) 25 MG tablet Take 1.5 tablets (37.5 mg total) by mouth 2 (two) times daily. 06/05/19 10/17/28  [name], MD  pantoprazole (PROTONIX) 40 MG tablet Take 1 tablet (40 mg total) by mouth 2 (two) times daily before a meal. 10/20/19 12/01/19  Antonieta Pert, MD    Allergies    Altace [ramipril] and Cozaar [losartan potassium]  Review of Systems   Review of Systems  Constitutional: Negative for chills and fever.  HENT: Negative for congestion.   Eyes: Negative for visual disturbance.  Respiratory: Negative for shortness of breath.   Cardiovascular: Negative for chest pain.  Gastrointestinal: Negative for abdominal pain and vomiting.  Genitourinary: Negative for dysuria and flank pain.  Musculoskeletal: Negative for back pain, neck pain and neck stiffness.  Skin: Negative for rash.  Neurological: Positive for light-headedness. Negative for weakness and headaches.    Physical Exam Updated Vital Signs BP (!) 157/96 (BP Location: Left Arm)   Pulse 70   Temp 98.4 F (36.9 C) (Oral)   Resp 18   Ht 6\' 1"  (1.854 m)   Wt 98 kg   SpO2 100%   BMI 28.50 kg/m   Physical Exam Vitals and nursing note reviewed.  Constitutional:      Appearance: He is well-developed.  HENT:     Head: Normocephalic and atraumatic.  Eyes:     General:        Right eye: No discharge.        Left eye: No discharge.     Conjunctiva/sclera: Conjunctivae normal.  Neck:     Trachea: No tracheal deviation.  Cardiovascular:     Rate and Rhythm: Normal rate and regular rhythm.     Heart sounds: No murmur.  Pulmonary:     Effort: Pulmonary effort is normal.      Breath sounds: Normal breath sounds.  Abdominal:     General: There is no distension.     Palpations: Abdomen is soft.     Tenderness: There is no abdominal tenderness. There is no guarding.  Musculoskeletal:        General: No swelling.     Cervical back: Normal range of motion and neck supple.  Skin:    General: Skin is warm.     Findings: No rash.  Neurological:     General: No focal deficit present.     Mental Status: He is alert and oriented to person, place, and time.  Psychiatric:        Mood and Affect: Mood normal.  ED Results / Procedures / Treatments   Labs (all labs ordered are listed, but only abnormal results are displayed) Labs Reviewed  BASIC METABOLIC PANEL - Abnormal; Notable for the following components:      Result Value   Sodium 133 (*)    Chloride 96 (*)    Glucose, Bld 267 (*)    BUN 24 (*)    Creatinine, Ser 1.64 (*)    GFR calc non Af Amer 40 (*)    GFR calc Af Amer 47 (*)    All other components within normal limits  URINALYSIS, ROUTINE W REFLEX MICROSCOPIC - Abnormal; Notable for the following components:   Glucose, UA >=500 (*)    Protein, ur 30 (*)    All other components within normal limits  CBC WITH DIFFERENTIAL/PLATELET - Abnormal; Notable for the following components:   Platelets 100 (*)    All other components within normal limits  URINALYSIS, MICROSCOPIC (REFLEX) - Abnormal; Notable for the following components:   Bacteria, UA RARE (*)    All other components within normal limits  CBG MONITORING, ED - Abnormal; Notable for the following components:   Glucose-Capillary 244 (*)    All other components within normal limits    EKG None  Radiology No results found.  Procedures Procedures (including critical care time)  Medications Ordered in ED Medications - No data to display  ED Course  I have reviewed the triage vital signs and the nursing notes.  Pertinent labs & imaging results that were available during my care of  the patient were reviewed by me and considered in my medical decision making (see chart for details).    MDM Rules/Calculators/A&P                      Patient presents for assessment since changes in his medication.  Overall patient is doing well but has mild symptoms.  Discussed plan to check screening blood work including kidney function which is 1.6 for creatinine, sodium 133.  Reviewed previous medical records and this is similar to creatinines he had before.  Patient has close outpatient follow-up and no emergent further testing indicated.  Glucose 267. No anion gap. Platelets mild low 100 and normal hemoglobin.  Final Clinical Impression(s) / ED Diagnoses Final diagnoses:  Hyponatremia  Type 2 diabetes mellitus with hyperglycemia, without long-term current use of insulin (HCC)  Chronic renal failure, unspecified CKD stage  Thrombocytopenia [facility])    Rx / DC Orders ED Discharge Orders    None       Elnora Morrison, MD 03/03/20 1511

## 2020-03-03 NOTE — ED Triage Notes (Signed)
Pt reports that he was started on steroids for RA 3 weeks ago. States that the steroid made him 'diabetic'. States that his PCP put him on metformin, his nephrologist took him off and started him on glipizide on 02/27/20. States that his CBG reads have been 300 at home. Reports improvement in 'diabetic' symptoms that he was having 2 weeks ago. A/O, no acute distress noted.

## 2020-03-11 DIAGNOSIS — E663 Overweight: Secondary | ICD-10-CM | POA: Diagnosis not present

## 2020-03-11 DIAGNOSIS — N183 Chronic kidney disease, stage 3 unspecified: Secondary | ICD-10-CM | POA: Diagnosis not present

## 2020-03-11 DIAGNOSIS — M2550 Pain in unspecified joint: Secondary | ICD-10-CM | POA: Diagnosis not present

## 2020-03-11 DIAGNOSIS — Z6829 Body mass index (BMI) 29.0-29.9, adult: Secondary | ICD-10-CM | POA: Diagnosis not present

## 2020-03-11 DIAGNOSIS — M0579 Rheumatoid arthritis with rheumatoid factor of multiple sites without organ or systems involvement: Secondary | ICD-10-CM | POA: Diagnosis not present

## 2020-03-11 DIAGNOSIS — R5382 Chronic fatigue, unspecified: Secondary | ICD-10-CM | POA: Diagnosis not present

## 2020-03-14 DIAGNOSIS — D638 Anemia in other chronic diseases classified elsewhere: Secondary | ICD-10-CM | POA: Diagnosis not present

## 2020-03-14 DIAGNOSIS — I10 Essential (primary) hypertension: Secondary | ICD-10-CM | POA: Diagnosis not present

## 2020-03-14 DIAGNOSIS — N183 Chronic kidney disease, stage 3 unspecified: Secondary | ICD-10-CM | POA: Diagnosis not present

## 2020-03-14 DIAGNOSIS — E1165 Type 2 diabetes mellitus with hyperglycemia: Secondary | ICD-10-CM | POA: Diagnosis not present

## 2020-04-02 DIAGNOSIS — R609 Edema, unspecified: Secondary | ICD-10-CM | POA: Diagnosis not present

## 2020-04-02 DIAGNOSIS — I10 Essential (primary) hypertension: Secondary | ICD-10-CM | POA: Diagnosis not present

## 2020-04-10 DIAGNOSIS — I10 Essential (primary) hypertension: Secondary | ICD-10-CM | POA: Diagnosis not present

## 2020-04-10 DIAGNOSIS — N183 Chronic kidney disease, stage 3 unspecified: Secondary | ICD-10-CM | POA: Diagnosis not present

## 2020-04-12 DIAGNOSIS — N390 Urinary tract infection, site not specified: Secondary | ICD-10-CM | POA: Diagnosis not present

## 2020-04-12 DIAGNOSIS — N183 Chronic kidney disease, stage 3 unspecified: Secondary | ICD-10-CM | POA: Diagnosis not present

## 2020-04-12 DIAGNOSIS — I10 Essential (primary) hypertension: Secondary | ICD-10-CM | POA: Diagnosis not present

## 2020-04-24 DIAGNOSIS — E1140 Type 2 diabetes mellitus with diabetic neuropathy, unspecified: Secondary | ICD-10-CM | POA: Diagnosis not present

## 2020-04-24 DIAGNOSIS — E119 Type 2 diabetes mellitus without complications: Secondary | ICD-10-CM | POA: Diagnosis not present

## 2020-04-25 DIAGNOSIS — E1140 Type 2 diabetes mellitus with diabetic neuropathy, unspecified: Secondary | ICD-10-CM | POA: Insufficient documentation

## 2020-05-01 DIAGNOSIS — M0579 Rheumatoid arthritis with rheumatoid factor of multiple sites without organ or systems involvement: Secondary | ICD-10-CM | POA: Diagnosis not present

## 2020-05-14 DIAGNOSIS — R69 Illness, unspecified: Secondary | ICD-10-CM | POA: Diagnosis not present

## 2020-05-22 DIAGNOSIS — M2550 Pain in unspecified joint: Secondary | ICD-10-CM | POA: Diagnosis not present

## 2020-05-22 DIAGNOSIS — R5382 Chronic fatigue, unspecified: Secondary | ICD-10-CM | POA: Diagnosis not present

## 2020-05-22 DIAGNOSIS — N183 Chronic kidney disease, stage 3 unspecified: Secondary | ICD-10-CM | POA: Diagnosis not present

## 2020-05-22 DIAGNOSIS — E663 Overweight: Secondary | ICD-10-CM | POA: Diagnosis not present

## 2020-05-22 DIAGNOSIS — M0579 Rheumatoid arthritis with rheumatoid factor of multiple sites without organ or systems involvement: Secondary | ICD-10-CM | POA: Diagnosis not present

## 2020-05-22 DIAGNOSIS — Z6829 Body mass index (BMI) 29.0-29.9, adult: Secondary | ICD-10-CM | POA: Diagnosis not present

## 2020-05-27 DIAGNOSIS — I10 Essential (primary) hypertension: Secondary | ICD-10-CM | POA: Diagnosis not present

## 2020-05-27 DIAGNOSIS — E1165 Type 2 diabetes mellitus with hyperglycemia: Secondary | ICD-10-CM | POA: Diagnosis not present

## 2020-05-27 DIAGNOSIS — D638 Anemia in other chronic diseases classified elsewhere: Secondary | ICD-10-CM | POA: Diagnosis not present

## 2020-05-27 DIAGNOSIS — N183 Chronic kidney disease, stage 3 unspecified: Secondary | ICD-10-CM | POA: Diagnosis not present

## 2020-05-28 DIAGNOSIS — I10 Essential (primary) hypertension: Secondary | ICD-10-CM | POA: Diagnosis not present

## 2020-05-28 DIAGNOSIS — N183 Chronic kidney disease, stage 3 unspecified: Secondary | ICD-10-CM | POA: Diagnosis not present

## 2020-05-31 DIAGNOSIS — I10 Essential (primary) hypertension: Secondary | ICD-10-CM | POA: Diagnosis not present

## 2020-05-31 DIAGNOSIS — N183 Chronic kidney disease, stage 3 unspecified: Secondary | ICD-10-CM | POA: Diagnosis not present

## 2020-05-31 DIAGNOSIS — N390 Urinary tract infection, site not specified: Secondary | ICD-10-CM | POA: Diagnosis not present

## 2020-06-04 DIAGNOSIS — E1140 Type 2 diabetes mellitus with diabetic neuropathy, unspecified: Secondary | ICD-10-CM | POA: Diagnosis not present

## 2020-06-05 IMAGING — CR DG HIP (WITH OR WITHOUT PELVIS) 2-3V*R*
3 series · 3 of 3 positions shown · non-contrast
Comparison: None.

CLINICAL DATA: Right hip pain since [REDACTED], remote hip
arthroplasty

EXAM:
DG HIP (WITH OR WITHOUT PELVIS) 2-3V RIGHT

[t pelvis a.p.]
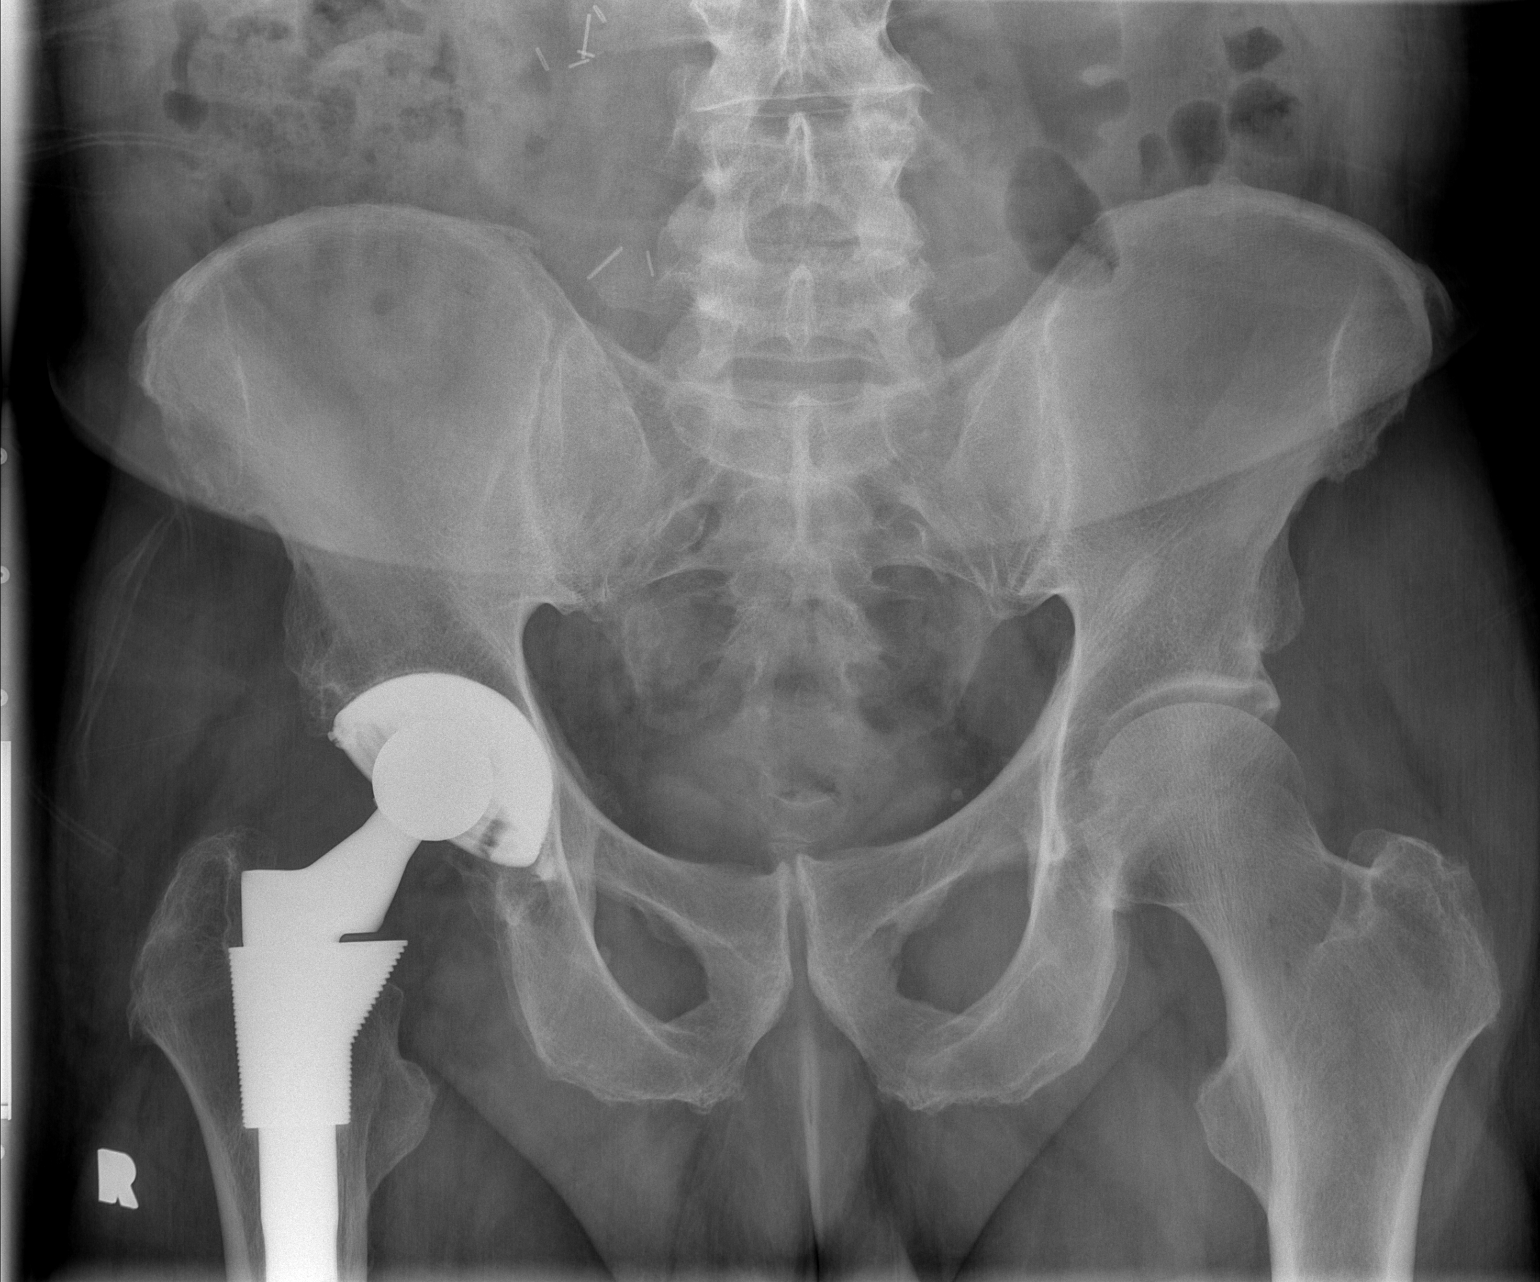

[t hip ap right]
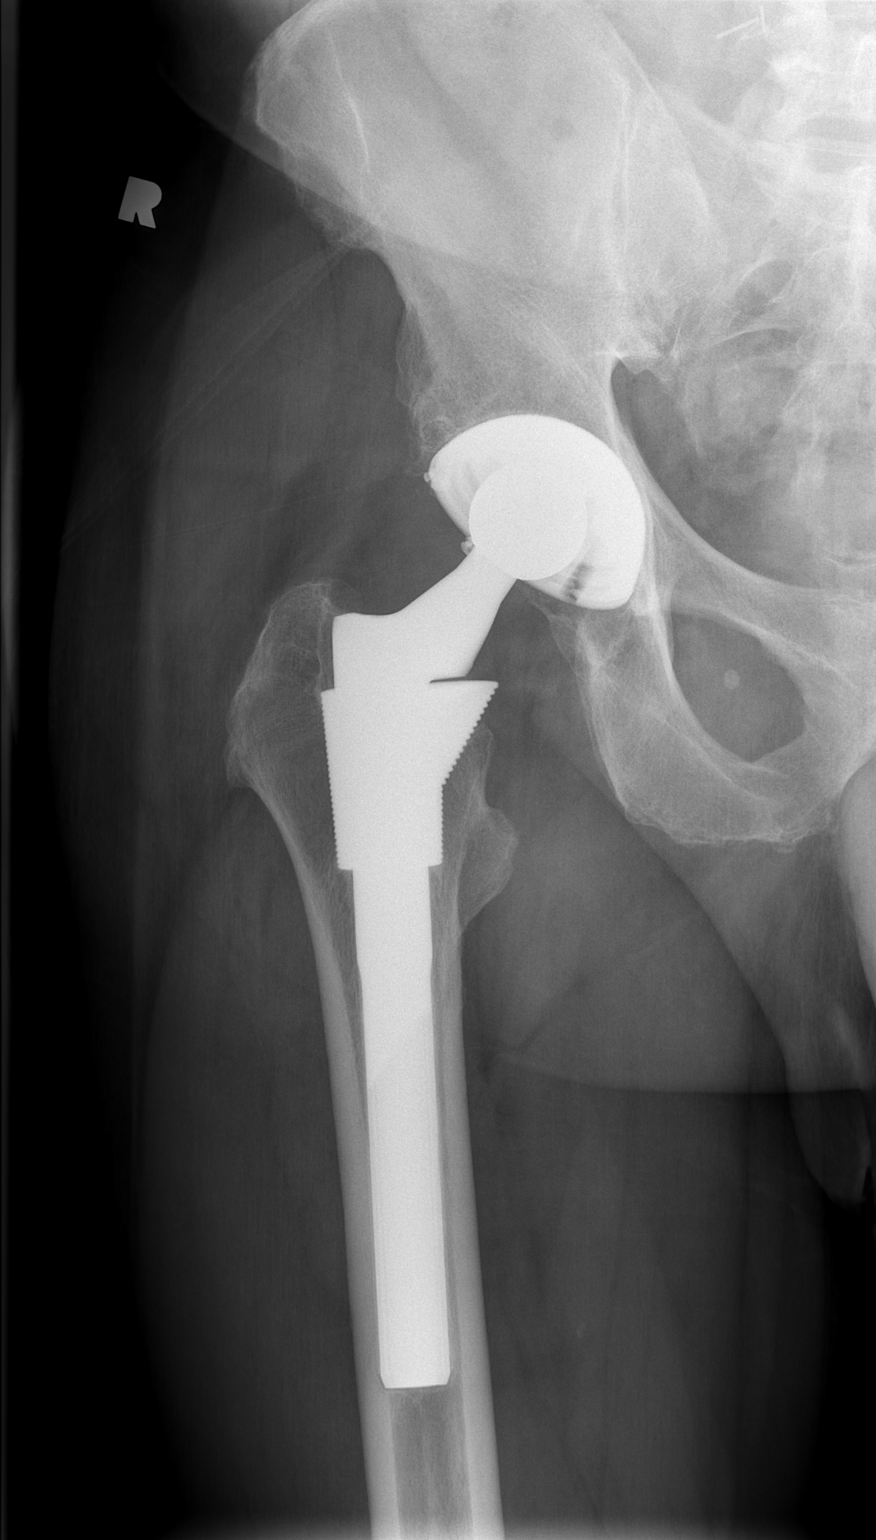

[t hip frog leg right]
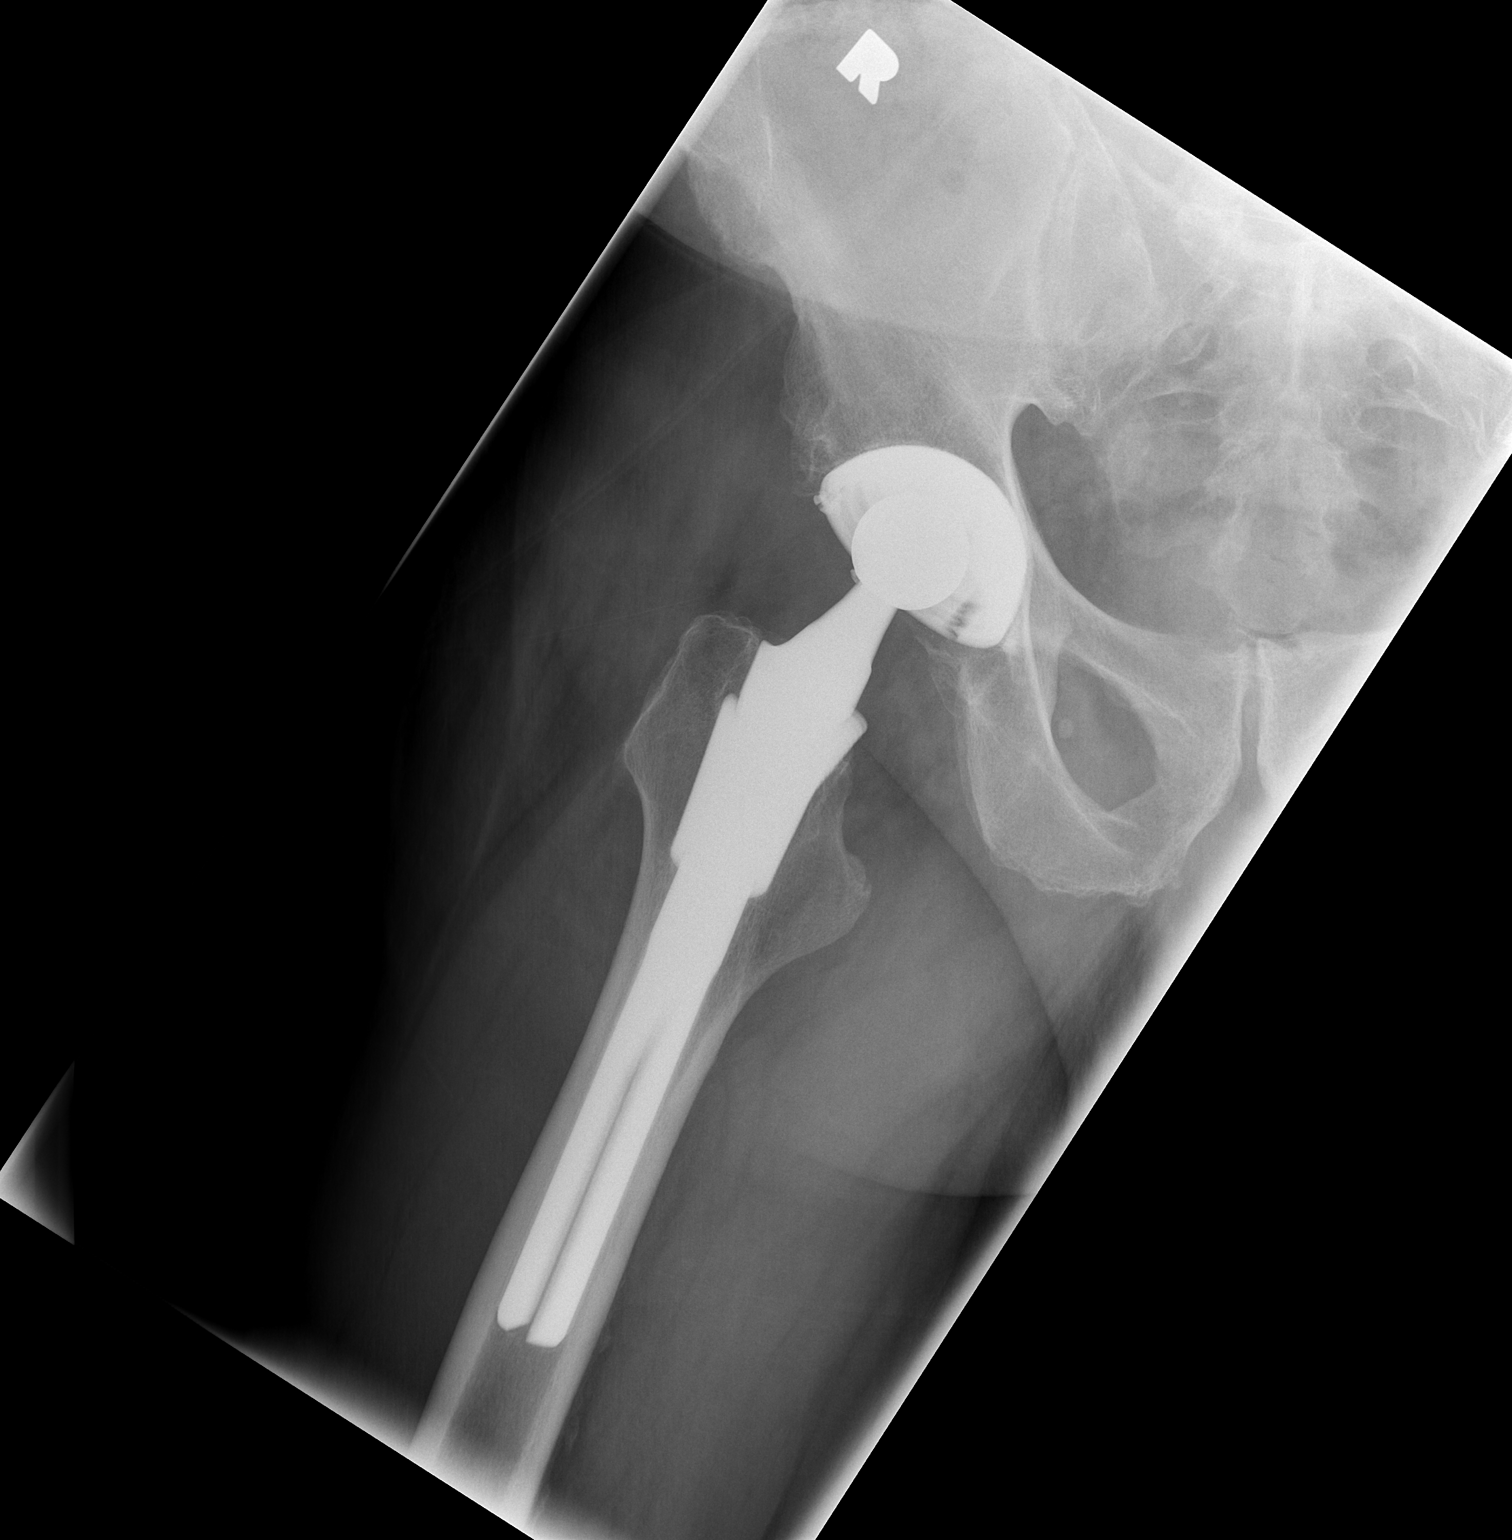

[3 of 3 positions shown; findings below may reference images not displayed]

FINDINGS: There is remote total right hip arthroplasty with normal alignment
of the articulating femoral component and acetabular cup. No
periprosthetic fracture or evidence of hardware loosening or other
complication. No significant soft tissue abnormality is seen.
Additional mild degenerative changes present the left hip, SI joints
and lower lumbar spine. Soft tissues are unremarkable.
IMPRESSION: 1. Right total hip arthroplasty without evidence of hardware
complication or other acute osseous abnormality.
2. Additional mild degenerative changes as described.

## 2020-06-06 DIAGNOSIS — M0579 Rheumatoid arthritis with rheumatoid factor of multiple sites without organ or systems involvement: Secondary | ICD-10-CM | POA: Diagnosis not present

## 2020-06-09 IMAGING — CR DG LUMBAR SPINE 2-3V
3 series · 3 of 3 positions shown · non-contrast
Comparison: None

CLINICAL DATA: Low back pain

EXAM:
LUMBAR SPINE - 2-3 VIEW

[t l-spine a.p.]
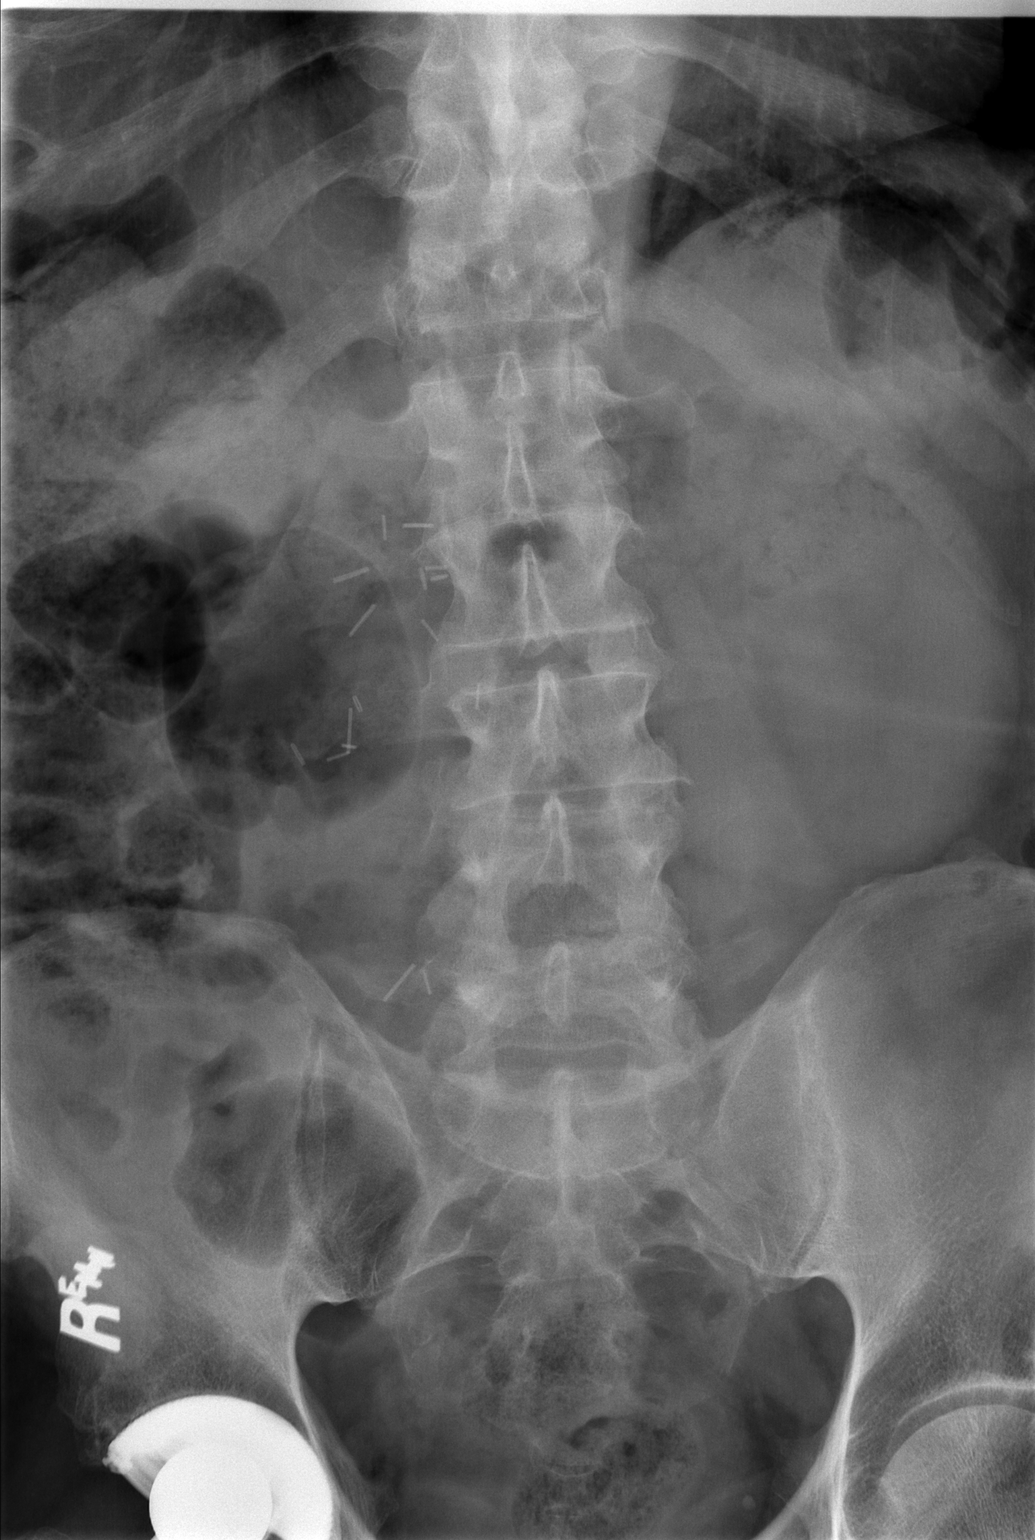

[t l-spine lat]
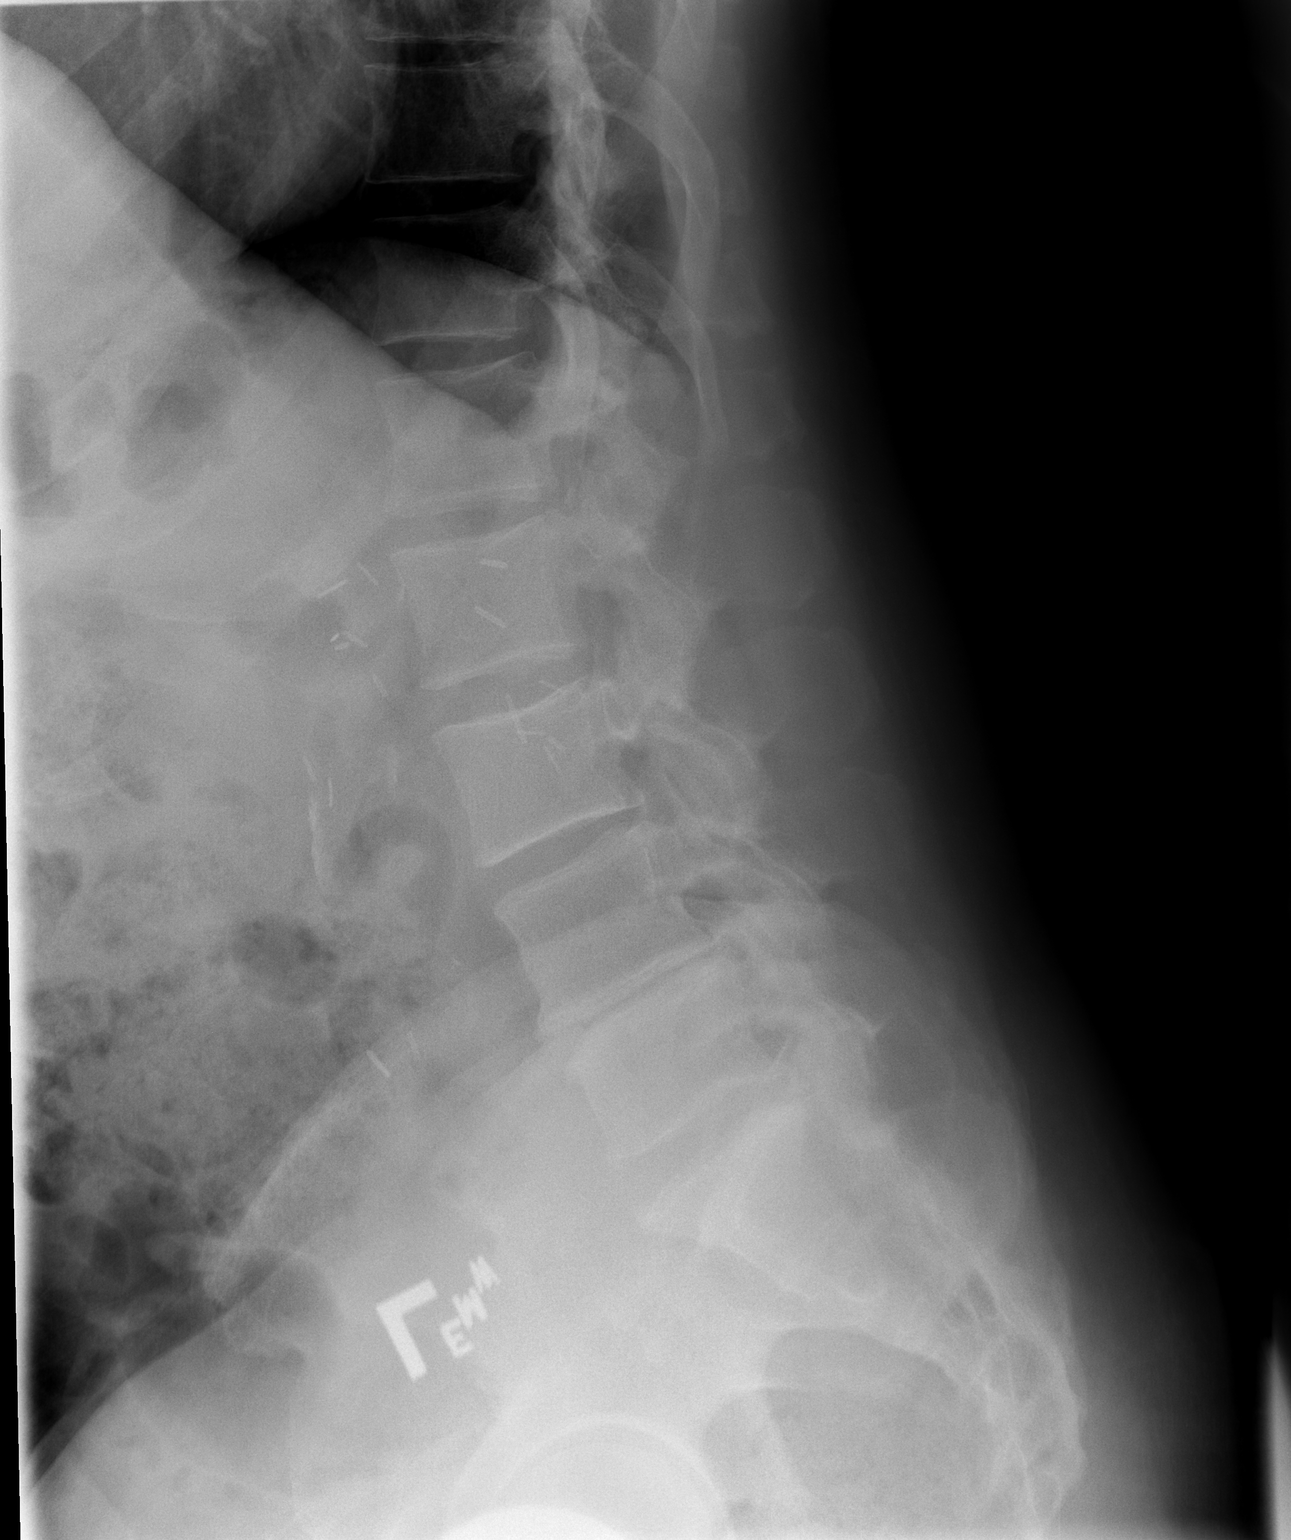

[t l-spine l5-s1 spot]
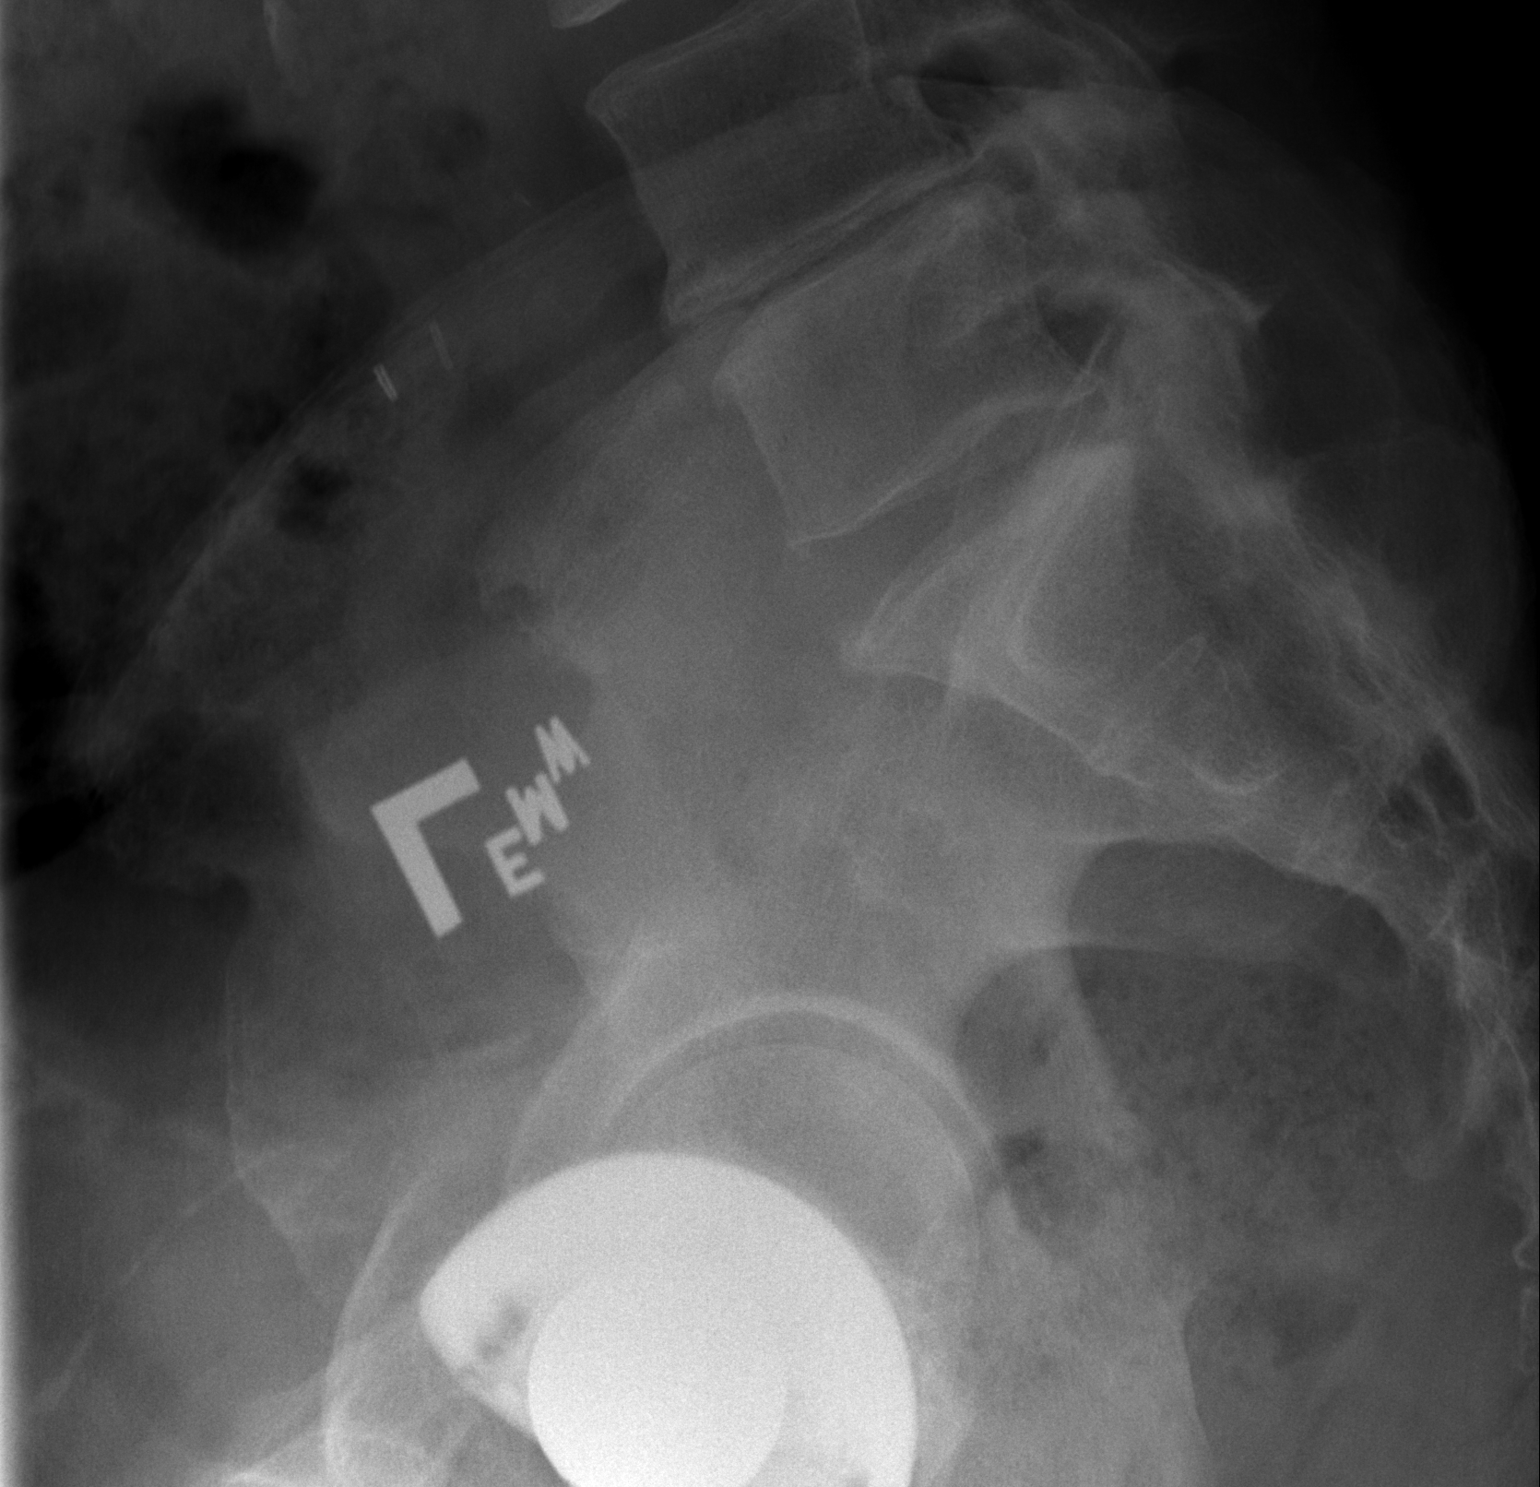

[3 of 3 positions shown; findings below may reference images not displayed]

FINDINGS: There is no significant listhesis. Vertebral body heights are
maintained. Disc space narrowing is present, greatest at L4-L5.
There is lower lumbar facet hypertrophy.

Surgical clips overlie the right abdomen. Partially imaged right hip
arthroplasty.
IMPRESSION: Multilevel degenerative changes, greatest at L4-L5.

## 2020-06-14 ENCOUNTER — Other Ambulatory Visit: Payer: Self-pay | Admitting: Internal Medicine

## 2020-07-02 DIAGNOSIS — H353131 Nonexudative age-related macular degeneration, bilateral, early dry stage: Secondary | ICD-10-CM | POA: Diagnosis not present

## 2020-07-02 DIAGNOSIS — H524 Presbyopia: Secondary | ICD-10-CM | POA: Diagnosis not present

## 2020-07-02 DIAGNOSIS — E119 Type 2 diabetes mellitus without complications: Secondary | ICD-10-CM | POA: Diagnosis not present

## 2020-07-03 DIAGNOSIS — E1140 Type 2 diabetes mellitus with diabetic neuropathy, unspecified: Secondary | ICD-10-CM | POA: Diagnosis not present

## 2020-07-18 ENCOUNTER — Telehealth: Payer: Self-pay | Admitting: Internal Medicine

## 2020-07-18 NOTE — Telephone Encounter (Signed)
° ° °  Pt c/o medication issue:  1. Name of Medication: Aspirin 1 mg   2. How are you currently taking this medication (dosage and times per day)?   3. Are you having a reaction (difficulty breathing--STAT)?   4. What is your medication issue? Pt said he is hearing about aspirin in the news and would like to know if he needs to take it

## 2020-07-20 NOTE — Telephone Encounter (Signed)
No he should not take ASA since he is on Eliquis

## 2020-07-22 NOTE — Telephone Encounter (Signed)
Spoke with the patient who states that he was taken off of Eliquis back in March 2021. He is currently taking ASA 81 mg daily and is wondering if this should be continued.

## 2020-07-22 NOTE — Telephone Encounter (Signed)
Spoke with the patient and advised him that per Dr. Radford Pax he does not need to be on ASA. Patient states that Dr. Lovena Le was the one who advised him to take ASA so he would like to know what Dr. Lovena Le thinks.

## 2020-07-22 NOTE — Telephone Encounter (Signed)
He does not need to be on ASA

## 2020-07-23 NOTE — Telephone Encounter (Signed)
New data suggests that patients without known atheroscerosis need not be on ASA. The risk/benefit would suggest that he come off of ASA.  GT

## 2020-07-23 NOTE — Telephone Encounter (Signed)
Advised patient that per Dr. Lovena Le he does not need to be on ASA.

## 2020-07-31 DIAGNOSIS — E1140 Type 2 diabetes mellitus with diabetic neuropathy, unspecified: Secondary | ICD-10-CM | POA: Diagnosis not present

## 2020-07-31 DIAGNOSIS — E119 Type 2 diabetes mellitus without complications: Secondary | ICD-10-CM | POA: Diagnosis not present

## 2020-08-06 DIAGNOSIS — E119 Type 2 diabetes mellitus without complications: Secondary | ICD-10-CM | POA: Diagnosis not present

## 2020-08-07 DIAGNOSIS — R0981 Nasal congestion: Secondary | ICD-10-CM | POA: Diagnosis not present

## 2020-08-21 DIAGNOSIS — R5382 Chronic fatigue, unspecified: Secondary | ICD-10-CM | POA: Diagnosis not present

## 2020-08-21 DIAGNOSIS — M0579 Rheumatoid arthritis with rheumatoid factor of multiple sites without organ or systems involvement: Secondary | ICD-10-CM | POA: Diagnosis not present

## 2020-08-21 DIAGNOSIS — Z6829 Body mass index (BMI) 29.0-29.9, adult: Secondary | ICD-10-CM | POA: Diagnosis not present

## 2020-08-21 DIAGNOSIS — N183 Chronic kidney disease, stage 3 unspecified: Secondary | ICD-10-CM | POA: Diagnosis not present

## 2020-08-21 DIAGNOSIS — M2550 Pain in unspecified joint: Secondary | ICD-10-CM | POA: Diagnosis not present

## 2020-08-21 DIAGNOSIS — E663 Overweight: Secondary | ICD-10-CM | POA: Diagnosis not present

## 2020-08-24 DIAGNOSIS — R69 Illness, unspecified: Secondary | ICD-10-CM | POA: Diagnosis not present

## 2020-08-26 ENCOUNTER — Ambulatory Visit: Payer: Medicare HMO | Admitting: Family Medicine

## 2020-08-26 ENCOUNTER — Encounter: Payer: Self-pay | Admitting: Family Medicine

## 2020-08-26 ENCOUNTER — Other Ambulatory Visit: Payer: Self-pay

## 2020-08-26 VITALS — BP 140/60 | HR 67 | Temp 96.8°F | Resp 12 | Ht 72.48 in | Wt 227.0 lb

## 2020-08-26 DIAGNOSIS — J4540 Moderate persistent asthma, uncomplicated: Secondary | ICD-10-CM

## 2020-08-26 DIAGNOSIS — J309 Allergic rhinitis, unspecified: Secondary | ICD-10-CM | POA: Diagnosis not present

## 2020-08-26 DIAGNOSIS — J4531 Mild persistent asthma with (acute) exacerbation: Secondary | ICD-10-CM | POA: Diagnosis not present

## 2020-08-26 DIAGNOSIS — R69 Illness, unspecified: Secondary | ICD-10-CM | POA: Diagnosis not present

## 2020-08-26 DIAGNOSIS — J339 Nasal polyp, unspecified: Secondary | ICD-10-CM

## 2020-08-26 MED ORDER — FLOVENT HFA 110 MCG/ACT IN AERO: INHALATION_SPRAY | RESPIRATORY_TRACT | 3 refills | Status: DC

## 2020-08-26 MED ORDER — LEVALBUTEROL TARTRATE 45 MCG/ACT IN AERO: 2.0000 | INHALATION_SPRAY | Freq: Four times a day (QID) | RESPIRATORY_TRACT | 12 refills | Status: DC | PRN

## 2020-08-26 MED ORDER — BUDESONIDE 0.5 MG/2ML IN SUSP: RESPIRATORY_TRACT | 3 refills | Status: DC

## 2020-08-26 NOTE — Progress Notes (Addendum)
[address] [place] [address] Dept: [phone]  FOLLOW UP NOTE  Patient ID: Paul Nigh Sr., male    DOB: [birth year]  Age: 74 y.o. MRN: [id number] Date of Office Visit: 08/26/2020  Assessment  Chief Complaint: Other (stuffy nose and sneezing)  HPI Paul Ranta Sr. is a 74 year old male who presents to the clinic for follow-up visit.  He was last seen in this clinic on 11/24/2017 by Dr. Verlin Fester for evaluation of asthma, nasal polyposis, and allergic rhinitis with unknown allergic component.  At today's visit he reports his asthma has been well controlled with no shortness of breath or cough with activity or rest.  He reports that he has does experience occasional wheeze.  He reports that he continues Symbicort 160 as needed and does not have access to a short acting beta agonist.  Allergic rhinitis is reported as poorly controlled with symptoms beginning 3 to 4 weeks ago including nasal congestion, sneeze, and clear rhinorrhea.  He is not currently using any saline nasal rinses or budesonide nasal rinses.  He reports that he had continues to experience a diminished sense of smell, however, his sense of taste remains intact.  His current medications are listed in the chart.   Drug Allergies:  Allergies  Allergen Reactions  . Altace [Ramipril] Other (See Comments)    Patient is unaware of allergy  . Cozaar [Losartan Potassium] Other (See Comments)    Patient is unaware of allergy    Physical Exam: BP 140/60   Pulse 67   Temp (!) 96.8 F (36 C) (Temporal)   Resp 12   Ht 6' 0.48" (1.841 m)   Wt 227 lb (103 kg)   SpO2 98%   BMI 30.38 kg/m    Physical Exam Vitals reviewed.  Constitutional:      Appearance: Normal appearance.  HENT:     Head: Normocephalic and atraumatic.     Right Ear: Tympanic membrane normal.     Left Ear: Tympanic membrane normal.     Nose:     Comments: Bilateral nares slightly erythematous with clear nasal drainage noted.  Pharynx normal.   Ears normal.  Eyes normal.    Mouth/Throat:     Pharynx: Oropharynx is clear.  Eyes:     Conjunctiva/sclera: Conjunctivae normal.  Cardiovascular:     Rate and Rhythm: Normal rate and regular rhythm.     Heart sounds: Normal heart sounds. No murmur heard.   Pulmonary:     Effort: Pulmonary effort is normal.     Breath sounds: Normal breath sounds.     Comments: Lungs clear to auscultation Musculoskeletal:        General: Normal range of motion.     Cervical back: Normal range of motion and neck supple.  Skin:    General: Skin is warm and dry.  Neurological:     Mental Status: He is alert and oriented to person, place, and time.  Psychiatric:        Mood and Affect: Mood normal.        Behavior: Behavior normal.        Thought Content: Thought content normal.        Judgment: Judgment normal.    Diagnostics: FVC 3.08, FEV1 1.94.  Predicted FVC 4.16, predicted FEV1 3.10.  Spirometry indicates mild restriction and mild airway obstruction.  Postbronchodilator spirometry FVC 3.49, FEV1 2.30.  Postbronchodilator spirometry indicates mild airway obstruction with 13% improvement in F VC and 19% improvement in FEV1.  Assessment and Plan: 1. Moderate persistent asthma, unspecified whether complicated   2. Allergic rhinitis, unspecified seasonality, unspecified trigger   3. Nasal polyposis     Meds ordered this encounter  Medications  . budesonide (PULMICORT) 0.5 MG/2ML nebulizer solution    Sig: Add entire contents of one vial of budesonide to a saline nasal spray bottle, then add saline solution and mix together. Use twice a day as needed for nasal congestion and nasal polyps.    Dispense:  120 mL    Refill:  3  . fluticasone (FLOVENT HFA) 110 MCG/ACT inhaler    Sig: For asthma flare, begin Flovent 110-2 puffs twice a day with a spacer for 2 weeks or until cough and wheeze free    Dispense:  1 each    Refill:  3    Please hold. Patient will call when needed  . levalbuterol  (XOPENEX HFA) 45 MCG/ACT inhaler    Sig: Inhale 2 puffs into the lungs every 6 (six) hours as needed for wheezing.    Dispense:  1 each    Refill:  12    Please hold. Patient will call when needed    Patient Instructions  Asthma For asthma flare, begin Flovent 110-2 puffs with a spacer for 2 weeks or until cough and wheeze free Begin levalbuterol 2 puffs every 6-8 hours as needed for shortness of breath cough or wheeze  Allergic rhinitis Restart budesonide/saline rinses once or twice a day Begin azelastine 2 sprays in each nostril twice a day as needed for a runny nose Consider saline nasal rinses as needed for nasal symptoms. Use this before any medicated nasal sprays for best result For thick postnasal drainage, begin Mucinex 600 to 1200 mg twice a day  Nasal polyposis Continue budesonide/saline nasal rinses once or twice a day  Call the clinic if this treatment plan is not working well for you  Follow up in 4 months or sooner if needed.    Return in about 4 months (around 12/24/2020), or if symptoms worsen or fail to improve.    Thank you for the opportunity to care for this patient.  Please do not hesitate to contact me with questions.  Paul Morgan, FNP [facility] [place]  ________________________________________________  I have provided oversight concerning [facility] Amb's evaluation and treatment of this patient's health issues addressed during today's encounter.  I agree with the assessment and therapeutic plan as outlined in the note.   Signed,   R Edgar Frisk, MD

## 2020-08-26 NOTE — Patient Instructions (Addendum)
Asthma For asthma flare, begin Flovent 110-2 puffs with a spacer for 2 weeks or until cough and wheeze free Begin levalbuterol 2 puffs every 6-8 hours as needed for shortness of breath cough or wheeze  Allergic rhinitis Restart budesonide/saline rinses once or twice a day Begin azelastine 2 sprays in each nostril twice a day as needed for a runny nose Consider saline nasal rinses as needed for nasal symptoms. Use this before any medicated nasal sprays for best result For thick postnasal drainage, begin Mucinex 600 to 1200 mg twice a day  Nasal polyposis Continue budesonide/saline nasal rinses once or twice a day  Call the clinic if this treatment plan is not working well for you  Follow up in 4 months or sooner if needed.

## 2020-08-27 ENCOUNTER — Telehealth: Payer: Self-pay | Admitting: Family Medicine

## 2020-08-27 NOTE — Telephone Encounter (Signed)
Pt states he failed to mention he is diabetic and wanted to know if xopenex would intefere with his A1c.

## 2020-08-27 NOTE — Telephone Encounter (Signed)
Please advise to xopenex affecting a1c

## 2020-08-27 NOTE — Telephone Encounter (Signed)
Xopenex does not contain an inhaled steroid and should not raise the blood sugar. It is not listed under the known side effects of Xopenex. Thank you

## 2020-08-27 NOTE — Telephone Encounter (Signed)
Pt informed and stated understanding

## 2020-09-02 DIAGNOSIS — H353132 Nonexudative age-related macular degeneration, bilateral, intermediate dry stage: Secondary | ICD-10-CM | POA: Diagnosis not present

## 2020-09-09 DIAGNOSIS — E1169 Type 2 diabetes mellitus with other specified complication: Secondary | ICD-10-CM | POA: Diagnosis not present

## 2020-09-09 DIAGNOSIS — R0989 Other specified symptoms and signs involving the circulatory and respiratory systems: Secondary | ICD-10-CM | POA: Diagnosis not present

## 2020-09-09 DIAGNOSIS — I10 Essential (primary) hypertension: Secondary | ICD-10-CM | POA: Diagnosis not present

## 2020-09-09 NOTE — Telephone Encounter (Signed)
PT believe the current meds are not working for his asthma. His pcp suggested he ask [facility] Silversmith if montelukast will work better for him?  [phone]

## 2020-09-10 ENCOUNTER — Other Ambulatory Visit: Payer: Self-pay

## 2020-09-10 MED ORDER — MONTELUKAST SODIUM 10 MG PO TABS: 10.0000 mg | ORAL_TABLET | Freq: Every day | ORAL | 5 refills | Status: DC

## 2020-09-10 NOTE — Telephone Encounter (Signed)
Can you please have this patient start montelukast 10 mg once a day for better control of allergy symptoms. Thank you

## 2020-09-10 NOTE — Telephone Encounter (Signed)
Pt informed and sent in rx

## 2020-09-22 DIAGNOSIS — R69 Illness, unspecified: Secondary | ICD-10-CM | POA: Diagnosis not present

## 2020-09-23 DIAGNOSIS — N183 Chronic kidney disease, stage 3 unspecified: Secondary | ICD-10-CM | POA: Diagnosis not present

## 2020-09-23 DIAGNOSIS — I10 Essential (primary) hypertension: Secondary | ICD-10-CM | POA: Diagnosis not present

## 2020-09-23 DIAGNOSIS — E1165 Type 2 diabetes mellitus with hyperglycemia: Secondary | ICD-10-CM | POA: Diagnosis not present

## 2020-09-23 DIAGNOSIS — D638 Anemia in other chronic diseases classified elsewhere: Secondary | ICD-10-CM | POA: Diagnosis not present

## 2020-10-28 DIAGNOSIS — K259 Gastric ulcer, unspecified as acute or chronic, without hemorrhage or perforation: Secondary | ICD-10-CM | POA: Diagnosis not present

## 2020-10-28 DIAGNOSIS — Z86010 Personal history of colonic polyps: Secondary | ICD-10-CM | POA: Diagnosis not present

## 2020-11-19 DIAGNOSIS — E119 Type 2 diabetes mellitus without complications: Secondary | ICD-10-CM | POA: Diagnosis not present

## 2020-11-22 DIAGNOSIS — Z6830 Body mass index (BMI) 30.0-30.9, adult: Secondary | ICD-10-CM | POA: Diagnosis not present

## 2020-11-22 DIAGNOSIS — N183 Chronic kidney disease, stage 3 unspecified: Secondary | ICD-10-CM | POA: Diagnosis not present

## 2020-11-22 DIAGNOSIS — R5382 Chronic fatigue, unspecified: Secondary | ICD-10-CM | POA: Diagnosis not present

## 2020-11-22 DIAGNOSIS — M2550 Pain in unspecified joint: Secondary | ICD-10-CM | POA: Diagnosis not present

## 2020-11-22 DIAGNOSIS — M0579 Rheumatoid arthritis with rheumatoid factor of multiple sites without organ or systems involvement: Secondary | ICD-10-CM | POA: Diagnosis not present

## 2020-11-22 DIAGNOSIS — E669 Obesity, unspecified: Secondary | ICD-10-CM | POA: Diagnosis not present

## 2020-11-29 DIAGNOSIS — N390 Urinary tract infection, site not specified: Secondary | ICD-10-CM | POA: Diagnosis not present

## 2020-11-29 DIAGNOSIS — N183 Chronic kidney disease, stage 3 unspecified: Secondary | ICD-10-CM | POA: Diagnosis not present

## 2020-11-29 DIAGNOSIS — I10 Essential (primary) hypertension: Secondary | ICD-10-CM | POA: Diagnosis not present

## 2020-12-03 DIAGNOSIS — N183 Chronic kidney disease, stage 3 unspecified: Secondary | ICD-10-CM | POA: Diagnosis not present

## 2020-12-03 DIAGNOSIS — I10 Essential (primary) hypertension: Secondary | ICD-10-CM | POA: Diagnosis not present

## 2020-12-11 ENCOUNTER — Other Ambulatory Visit: Payer: Self-pay | Admitting: Internal Medicine

## 2020-12-13 DIAGNOSIS — M25562 Pain in left knee: Secondary | ICD-10-CM | POA: Diagnosis not present

## 2020-12-18 DIAGNOSIS — R5381 Other malaise: Secondary | ICD-10-CM | POA: Diagnosis not present

## 2020-12-31 DIAGNOSIS — E1169 Type 2 diabetes mellitus with other specified complication: Secondary | ICD-10-CM | POA: Diagnosis not present

## 2020-12-31 DIAGNOSIS — N183 Chronic kidney disease, stage 3 unspecified: Secondary | ICD-10-CM | POA: Diagnosis not present

## 2020-12-31 DIAGNOSIS — Z8679 Personal history of other diseases of the circulatory system: Secondary | ICD-10-CM | POA: Diagnosis not present

## 2020-12-31 DIAGNOSIS — Z125 Encounter for screening for malignant neoplasm of prostate: Secondary | ICD-10-CM | POA: Diagnosis not present

## 2020-12-31 DIAGNOSIS — Z0000 Encounter for general adult medical examination without abnormal findings: Secondary | ICD-10-CM | POA: Diagnosis not present

## 2020-12-31 DIAGNOSIS — Z01812 Encounter for preprocedural laboratory examination: Secondary | ICD-10-CM | POA: Diagnosis not present

## 2020-12-31 DIAGNOSIS — E559 Vitamin D deficiency, unspecified: Secondary | ICD-10-CM | POA: Diagnosis not present

## 2021-01-03 DIAGNOSIS — D175 Benign lipomatous neoplasm of intra-abdominal organs: Secondary | ICD-10-CM | POA: Diagnosis not present

## 2021-01-03 DIAGNOSIS — K621 Rectal polyp: Secondary | ICD-10-CM | POA: Diagnosis not present

## 2021-01-03 DIAGNOSIS — K6389 Other specified diseases of intestine: Secondary | ICD-10-CM | POA: Diagnosis not present

## 2021-01-03 DIAGNOSIS — D122 Benign neoplasm of ascending colon: Secondary | ICD-10-CM | POA: Diagnosis not present

## 2021-01-03 DIAGNOSIS — K5730 Diverticulosis of large intestine without perforation or abscess without bleeding: Secondary | ICD-10-CM | POA: Diagnosis not present

## 2021-01-03 DIAGNOSIS — Z86010 Personal history of colonic polyps: Secondary | ICD-10-CM | POA: Diagnosis not present

## 2021-01-03 DIAGNOSIS — K640 First degree hemorrhoids: Secondary | ICD-10-CM | POA: Diagnosis not present

## 2021-01-03 DIAGNOSIS — D124 Benign neoplasm of descending colon: Secondary | ICD-10-CM | POA: Diagnosis not present

## 2021-01-07 DIAGNOSIS — D124 Benign neoplasm of descending colon: Secondary | ICD-10-CM | POA: Diagnosis not present

## 2021-01-07 DIAGNOSIS — D122 Benign neoplasm of ascending colon: Secondary | ICD-10-CM | POA: Diagnosis not present

## 2021-01-07 DIAGNOSIS — K621 Rectal polyp: Secondary | ICD-10-CM | POA: Diagnosis not present

## 2021-01-10 ENCOUNTER — Other Ambulatory Visit: Payer: Self-pay

## 2021-01-10 ENCOUNTER — Ambulatory Visit: Payer: Medicare HMO | Admitting: Internal Medicine

## 2021-01-10 ENCOUNTER — Encounter: Payer: Self-pay | Admitting: Internal Medicine

## 2021-01-10 VITALS — BP 166/82 | HR 65 | Ht 73.0 in | Wt 232.0 lb

## 2021-01-10 DIAGNOSIS — I10 Essential (primary) hypertension: Secondary | ICD-10-CM

## 2021-01-10 DIAGNOSIS — I4892 Unspecified atrial flutter: Secondary | ICD-10-CM | POA: Diagnosis not present

## 2021-01-10 NOTE — Patient Instructions (Addendum)
Medication Instructions:  °Your physician recommends that you continue on your current medications as directed. Please refer to the Current Medication list given to you today. ° °Labwork: °None ordered. ° °Testing/Procedures: °None ordered. ° °Follow-Up: °Your physician wants you to follow-up in: 6 months with Gregg Taylor, MD or one of the following Advanced Practice Providers on your designated Care Team:   °· Amber Seiler, NP °· Renee Ursuy, PA-C °· Michael "Andy" Tillery, PA-C ° ° °Any Other Special Instructions Will Be Listed Below (If Applicable). ° °If you need a refill on your cardiac medications before your next appointment, please call your pharmacy.  ° ° ° ° ° °

## 2021-01-10 NOTE — Progress Notes (Signed)
HPI Mr. Fricker returns today for followup of atrial flutter and HTN,s/p ablation. He was noted to have an irregular heart beat due to PVC's. He admits to drinking too much coffee. He denies chest pain, sob, or palpitations. No edema.  Allergies  Allergen Reactions  . Altace [Ramipril] Other (See Comments)    Patient is unaware of allergy  . Cozaar [Losartan Potassium] Other (See Comments)    Patient is unaware of allergy     Current Outpatient Medications  Medication Sig Dispense Refill  . amLODipine (NORVASC) 5 MG tablet Take 5 mg by mouth every evening.     . budesonide (PULMICORT) 0.5 MG/2ML nebulizer solution Add entire contents of one vial of budesonide to a saline nasal spray bottle, then add saline solution and mix together. Use twice a day as needed for nasal congestion and nasal polyps. 120 mL 3  . budesonide-formoterol (SYMBICORT) 160-4.5 MCG/ACT inhaler Inhale 2 puffs into the lungs as needed.     . Cholecalciferol (VITAMIN D3) 50 MCG (2000 UT) capsule Take 2,000 Units by mouth daily.    . dapagliflozin propanediol (FARXIGA) 10 MG TABS tablet Take 1 tablet by mouth daily.    . DULoxetine (CYMBALTA) 20 MG capsule Take by mouth.    Scarlette Shorts SURECLICK 50 MG/ML injection Inject into the skin.    . fluticasone (FLONASE) 50 MCG/ACT nasal spray Place into both nostrils as needed.    . fluticasone (FLOVENT HFA) 110 MCG/ACT inhaler For asthma flare, begin Flovent 110-2 puffs twice a day with a spacer for 2 weeks or until cough and wheeze free 1 each 3  . gabapentin (NEURONTIN) 300 MG capsule Take 300 mg by mouth in the morning, at noon, and at bedtime.    Marland Kitchen glipiZIDE (GLUCOTROL XL) 2.5 MG 24 hr tablet Take 1 tablet by mouth daily with breakfast.    . hydrALAZINE (APRESOLINE) 100 MG tablet Take 100 mg by mouth 3 (three) times daily.    . hydrochlorothiazide (HYDRODIURIL) 12.5 MG tablet Take 12.5 mg by mouth every Monday, Wednesday, and Friday.    . hydroxychloroquine (PLAQUENIL)  200 MG tablet Take 1 tablet by mouth in the morning and at bedtime.    . metoprolol tartrate (LOPRESSOR) 25 MG tablet TAKE 1 & 1/2 (ONE & ONE-HALF) TABLETS BY MOUTH TWICE DAILY 270 tablet 0  . pravastatin (PRAVACHOL) 20 MG tablet Take 1 tablet by mouth once a week. On hold per ENDO    . sildenafil (VIAGRA) 100 MG tablet Take 50-100 mg by mouth daily as needed for erectile dysfunction.     No current facility-administered medications for this visit.     Past Medical History:  Diagnosis Date  . Abnormal EKG   . Anemia   . Atrial fibrillation ([place])   . CKD (chronic kidney disease), stage III ([place])   . Essential hypertension 07/18/2009   Qualifier: Diagnosis of  By: [name], [name]    . Fatigue   . Hypertension   . KIDNEY CANCER 07/18/2009   s/p right neprectomy  . Moderate persistent asthma 07/18/2009       . Nasal polyposis 12/16/2016  . Onychomycosis 12/19/2014  . Pain in lower limb 12/19/2014  . Palpitations   . Perennial allergic rhinitis with nonallergic component 06/17/2016  . PVC (premature ventricular contraction)   . Sinusitis, maxillary, chronic 11/09/2012    ROS:   All systems reviewed and negative except as noted in the HPI.   Past Surgical History:  Procedure Laterality Date  . A-FLUTTER ABLATION N/A 05/08/2019   Procedure: A-FLUTTER ABLATION;  Surgeon: [name], MD;  Location: [facility];  Service: Cardiovascular;  Laterality: N/A;  . ESOPHAGOGASTRODUODENOSCOPY (EGD) WITH PROPOFOL Left 10/19/2019   Procedure: ESOPHAGOGASTRODUODENOSCOPY (EGD) WITH PROPOFOL;  Surgeon: [name], MD;  Location: [facility] END[facility];  Service: Endoscopy;  Laterality: Left;  . HEMORRHOID SURGERY    . right ankle surgery    . right hip replacement    . right kidney removed  2000   for renal ca  . SINOSCOPY       Family History  Problem Relation Age of Onset  . Hypertension Mother   . Diabetes Mother   . Heart attack Mother   . Unexplained death Father   . Colon  cancer Sister   . Diabetes Brother   . Alcoholism Brother   . Colon cancer Sister   . Healthy Son   . Healthy Son   . Healthy Son   . Lung disease Neg Hx   . Allergic rhinitis Neg Hx   . Angioedema Neg Hx   . Asthma Neg Hx   . Eczema Neg Hx   . Immunodeficiency Neg Hx   . Urticaria Neg Hx      Social History   Socioeconomic History  . Marital status: Married    Spouse name: Not on file  . Number of children: 3  . Years of education: Not on file  . Highest education level: Not on file  Occupational History  . Occupation: Retired    Comment: Patent attorney  Tobacco Use  . Smoking status: Never Smoker  . Smokeless tobacco: Never Used  Vaping Use  . Vaping Use: Never used  Substance and Sexual Activity  . Alcohol use: Yes    Alcohol/week: 0.0 standard drinks    Comment: 1-2 drinks/month  . Drug use: No  . Sexual activity: Not on file  Other Topics Concern  . Not on file  Social History Narrative   Originally from [place]. Moved to [place] in [place]. Has also lived in [place]. Has traveled to [place], [place], [place], [place], [place], [place], [place], [place], [place], [place], [place], [place], [place], [place], [place], [place], [place], [place], & [place]. 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 No pets currently. No bird, mold or hot tub exposure. Enjoys exercising regularly, fishing & traveling.    Social Determinants of Health   Financial Resource Strain: Not on file  Food Insecurity: Not on file  Transportation Needs: Not on file  Physical Activity: Not on file  Stress: Not on file  Social Connections: Not on file  Intimate Partner Violence: Not on file     BP (!) 166/82   Pulse 65   Ht 6\' 1"  (1.854 m)   Wt 232 lb (105.2 kg)   SpO2 99%   BMI 30.61 kg/m   Physical Exam:  Well appearing NAD HEENT: Unremarkable Neck:  No JVD, no thyromegally Lymphatics:  No adenopathy Back:  No CVA  tenderness Lungs:  Clear HEART:  Regular rate rhythm, no murmurs, no rubs, no clicks Abd:  soft, positive bowel sounds, no organomegally, no rebound, no guarding Ext:  2 plus pulses, no edema, no cyanosis, no clubbing Skin:  No rashes no nodules Neuro:  CN II through XII intact, motor grossly intact  EKG - NSR with PVC's   Assess/Plan: 1. PVC's -  he is asymptomatic. I encouraged him to reduce is intake of caffeine. He is to undergo watchful waiting.  2. Atrial flutter - no evidence of recurrent atrial arrhythmias. We will follow. 3. HTN - his bp is elevated but he did not take his meds this morning.  Carleene Overlie Dilia Alemany,MD

## 2021-02-03 DIAGNOSIS — M25562 Pain in left knee: Secondary | ICD-10-CM | POA: Diagnosis not present

## 2021-02-05 DIAGNOSIS — E1140 Type 2 diabetes mellitus with diabetic neuropathy, unspecified: Secondary | ICD-10-CM | POA: Diagnosis not present

## 2021-02-05 DIAGNOSIS — E119 Type 2 diabetes mellitus without complications: Secondary | ICD-10-CM | POA: Diagnosis not present

## 2021-02-17 ENCOUNTER — Other Ambulatory Visit: Payer: Self-pay | Admitting: Internal Medicine

## 2021-02-19 DIAGNOSIS — E669 Obesity, unspecified: Secondary | ICD-10-CM | POA: Diagnosis not present

## 2021-02-19 DIAGNOSIS — M0579 Rheumatoid arthritis with rheumatoid factor of multiple sites without organ or systems involvement: Secondary | ICD-10-CM | POA: Diagnosis not present

## 2021-02-19 DIAGNOSIS — M2550 Pain in unspecified joint: Secondary | ICD-10-CM | POA: Diagnosis not present

## 2021-02-19 DIAGNOSIS — R5382 Chronic fatigue, unspecified: Secondary | ICD-10-CM | POA: Diagnosis not present

## 2021-02-19 DIAGNOSIS — Z6831 Body mass index (BMI) 31.0-31.9, adult: Secondary | ICD-10-CM | POA: Diagnosis not present

## 2021-02-19 DIAGNOSIS — N183 Chronic kidney disease, stage 3 unspecified: Secondary | ICD-10-CM | POA: Diagnosis not present

## 2021-03-24 DIAGNOSIS — I10 Essential (primary) hypertension: Secondary | ICD-10-CM | POA: Diagnosis not present

## 2021-03-24 DIAGNOSIS — M069 Rheumatoid arthritis, unspecified: Secondary | ICD-10-CM | POA: Diagnosis not present

## 2021-03-24 DIAGNOSIS — D638 Anemia in other chronic diseases classified elsewhere: Secondary | ICD-10-CM | POA: Diagnosis not present

## 2021-03-24 DIAGNOSIS — N183 Chronic kidney disease, stage 3 unspecified: Secondary | ICD-10-CM | POA: Diagnosis not present

## 2021-03-24 DIAGNOSIS — Z905 Acquired absence of kidney: Secondary | ICD-10-CM | POA: Diagnosis not present

## 2021-03-24 DIAGNOSIS — E1165 Type 2 diabetes mellitus with hyperglycemia: Secondary | ICD-10-CM | POA: Diagnosis not present

## 2021-04-29 DIAGNOSIS — D649 Anemia, unspecified: Secondary | ICD-10-CM | POA: Diagnosis not present

## 2021-04-29 DIAGNOSIS — J019 Acute sinusitis, unspecified: Secondary | ICD-10-CM | POA: Diagnosis not present

## 2021-05-09 DIAGNOSIS — U071 COVID-19: Secondary | ICD-10-CM | POA: Diagnosis not present

## 2021-05-13 DIAGNOSIS — Z8616 Personal history of COVID-19: Secondary | ICD-10-CM | POA: Diagnosis not present

## 2021-05-13 DIAGNOSIS — Z6829 Body mass index (BMI) 29.0-29.9, adult: Secondary | ICD-10-CM | POA: Diagnosis not present

## 2021-05-30 DIAGNOSIS — Z86010 Personal history of colonic polyps: Secondary | ICD-10-CM | POA: Diagnosis not present

## 2021-05-30 DIAGNOSIS — K5900 Constipation, unspecified: Secondary | ICD-10-CM | POA: Diagnosis not present

## 2021-06-04 DIAGNOSIS — N390 Urinary tract infection, site not specified: Secondary | ICD-10-CM | POA: Diagnosis not present

## 2021-06-04 DIAGNOSIS — N183 Chronic kidney disease, stage 3 unspecified: Secondary | ICD-10-CM | POA: Diagnosis not present

## 2021-06-04 DIAGNOSIS — I10 Essential (primary) hypertension: Secondary | ICD-10-CM | POA: Diagnosis not present

## 2021-06-11 DIAGNOSIS — N183 Chronic kidney disease, stage 3 unspecified: Secondary | ICD-10-CM | POA: Diagnosis not present

## 2021-06-25 DIAGNOSIS — N183 Chronic kidney disease, stage 3 unspecified: Secondary | ICD-10-CM | POA: Diagnosis not present

## 2021-06-25 DIAGNOSIS — R5382 Chronic fatigue, unspecified: Secondary | ICD-10-CM | POA: Diagnosis not present

## 2021-06-25 DIAGNOSIS — Z6831 Body mass index (BMI) 31.0-31.9, adult: Secondary | ICD-10-CM | POA: Diagnosis not present

## 2021-06-25 DIAGNOSIS — E669 Obesity, unspecified: Secondary | ICD-10-CM | POA: Diagnosis not present

## 2021-06-25 DIAGNOSIS — M2550 Pain in unspecified joint: Secondary | ICD-10-CM | POA: Diagnosis not present

## 2021-06-25 DIAGNOSIS — M0579 Rheumatoid arthritis with rheumatoid factor of multiple sites without organ or systems involvement: Secondary | ICD-10-CM | POA: Diagnosis not present

## 2021-07-03 DIAGNOSIS — H353131 Nonexudative age-related macular degeneration, bilateral, early dry stage: Secondary | ICD-10-CM | POA: Diagnosis not present

## 2021-07-03 DIAGNOSIS — H35033 Hypertensive retinopathy, bilateral: Secondary | ICD-10-CM | POA: Diagnosis not present

## 2021-07-03 DIAGNOSIS — H5203 Hypermetropia, bilateral: Secondary | ICD-10-CM | POA: Diagnosis not present

## 2021-07-03 DIAGNOSIS — H35363 Drusen (degenerative) of macula, bilateral: Secondary | ICD-10-CM | POA: Diagnosis not present

## 2021-07-03 DIAGNOSIS — I10 Essential (primary) hypertension: Secondary | ICD-10-CM | POA: Diagnosis not present

## 2021-07-03 DIAGNOSIS — H3530 Unspecified macular degeneration: Secondary | ICD-10-CM | POA: Diagnosis not present

## 2021-07-03 DIAGNOSIS — E119 Type 2 diabetes mellitus without complications: Secondary | ICD-10-CM | POA: Diagnosis not present

## 2021-07-25 DIAGNOSIS — N644 Mastodynia: Secondary | ICD-10-CM | POA: Diagnosis not present

## 2021-07-25 DIAGNOSIS — R0681 Apnea, not elsewhere classified: Secondary | ICD-10-CM | POA: Diagnosis not present

## 2021-07-25 DIAGNOSIS — Z23 Encounter for immunization: Secondary | ICD-10-CM | POA: Diagnosis not present

## 2021-07-30 ENCOUNTER — Other Ambulatory Visit: Payer: Self-pay

## 2021-07-30 ENCOUNTER — Encounter: Payer: Self-pay | Admitting: Internal Medicine

## 2021-07-30 ENCOUNTER — Ambulatory Visit: Payer: Medicare HMO | Admitting: Internal Medicine

## 2021-07-30 VITALS — BP 132/70 | HR 62 | Ht 73.0 in | Wt 232.0 lb

## 2021-07-30 DIAGNOSIS — I4892 Unspecified atrial flutter: Secondary | ICD-10-CM

## 2021-07-30 DIAGNOSIS — I10 Essential (primary) hypertension: Secondary | ICD-10-CM | POA: Diagnosis not present

## 2021-07-30 NOTE — Patient Instructions (Addendum)
Medication Instructions:  Your physician recommends that you continue on your current medications as directed. Please refer to the Current Medication list given to you today.  Labwork: None ordered.  Testing/Procedures: None ordered.  Follow-Up: Your physician wants you to follow-up in: as needed with Gregg Taylor, MD    Any Other Special Instructions Will Be Listed Below (If Applicable).  If you need a refill on your cardiac medications before your next appointment, please call your pharmacy.       

## 2021-07-30 NOTE — Progress Notes (Signed)
HPI Paul Roach returns today for followup of atrial flutter and HTN,s/p ablation. He was noted to have an irregular heart beat due to PVC's. He admits to drinking too much coffee. He denies chest pain, sob, or palpitations. No edema. He has not had any sustained arrhythmias.  Allergies  Allergen Reactions   Altace [Ramipril] Other (See Comments)    Patient is unaware of allergy   Cozaar [Losartan Potassium] Other (See Comments)    Patient is unaware of allergy     Current Outpatient Medications  Medication Sig Dispense Refill   amLODipine (NORVASC) 5 MG tablet Take 5 mg by mouth every evening.      budesonide (PULMICORT) 0.5 MG/2ML nebulizer solution Add entire contents of one vial of budesonide to a saline nasal spray bottle, then add saline solution and mix together. Use twice a day as needed for nasal congestion and nasal polyps. 120 mL 3   budesonide-formoterol (SYMBICORT) 160-4.5 MCG/ACT inhaler Inhale 2 puffs into the lungs as needed.      Cholecalciferol (VITAMIN D3) 50 MCG (2000 UT) capsule Take 2,000 Units by mouth daily.     dapagliflozin propanediol (FARXIGA) 10 MG TABS tablet Take 1 tablet by mouth daily.     ENBREL SURECLICK 50 MG/ML injection Inject into the skin.     fluticasone (FLONASE) 50 MCG/ACT nasal spray Place into both nostrils as needed.     fluticasone (FLOVENT HFA) 110 MCG/ACT inhaler For asthma flare, begin Flovent 110-2 puffs twice a day with a spacer for 2 weeks or until cough and wheeze free 1 each 3   gabapentin (NEURONTIN) 300 MG capsule Take 300 mg by mouth in the morning, at noon, and at bedtime.     glipiZIDE (GLUCOTROL XL) 2.5 MG 24 hr tablet Take 1 tablet by mouth daily with breakfast.     hydrALAZINE (APRESOLINE) 100 MG tablet Take 100 mg by mouth 3 (three) times daily.     hydrochlorothiazide (HYDRODIURIL) 12.5 MG tablet Take 12.5 mg by mouth every Monday, Wednesday, and Friday.     hydroxychloroquine (PLAQUENIL) 200 MG tablet Take 1 tablet by  mouth in the morning and at bedtime.     metoprolol tartrate (LOPRESSOR) 25 MG tablet TAKE 1 & 1/2 (ONE & ONE-HALF) TABLETS BY MOUTH TWICE DAILY 270 tablet 3   pravastatin (PRAVACHOL) 20 MG tablet Take 1 tablet by mouth once a week. On hold per ENDO     sildenafil (VIAGRA) 100 MG tablet Take 50-100 mg by mouth daily as needed for erectile dysfunction.     DULoxetine (CYMBALTA) 20 MG capsule Take by mouth.     No current facility-administered medications for this visit.     Past Medical History:  Diagnosis Date   Abnormal EKG    Anemia    Atrial fibrillation ([facility])    CKD (chronic kidney disease), stage III ([place])    Essential hypertension 07/18/2009   Qualifier: Diagnosis of  By: [name], [name]     Fatigue    Hypertension    KIDNEY CANCER 07/18/2009   s/p right neprectomy   Moderate persistent asthma 07/18/2009        Nasal polyposis 12/16/2016   Onychomycosis 12/19/2014   Pain in lower limb 12/19/2014   Palpitations    Perennial allergic rhinitis with nonallergic component 06/17/2016   PVC (premature ventricular contraction)    Sinusitis, maxillary, chronic 11/09/2012    ROS:   All systems reviewed and negative except as noted in the  HPI.   Past Surgical History:  Procedure Laterality Date   A-FLUTTER ABLATION N/A 05/08/2019   Procedure: A-FLUTTER ABLATION;  Surgeon: [name], MD;  Location: [facility];  Service: Cardiovascular;  Laterality: N/A;   ESOPHAGOGASTRODUODENOSCOPY (EGD) WITH PROPOFOL Left 10/19/2019   Procedure: ESOPHAGOGASTRODUODENOSCOPY (EGD) WITH PROPOFOL;  Surgeon: [name], MD;  Location: [facility];  Service: Endoscopy;  Laterality: Left;   HEMORRHOID SURGERY     right ankle surgery     right hip replacement     right kidney removed  2000   for renal ca   SINOSCOPY       Family History  Problem Relation Age of Onset   Hypertension Mother    Diabetes Mother    Heart attack Mother    Unexplained death Father    Colon cancer  Sister    Diabetes Brother    Alcoholism Brother    Colon cancer Sister    Healthy Son    Healthy Son    Healthy Son    Lung disease Neg Hx    Allergic rhinitis Neg Hx    Angioedema Neg Hx    Asthma Neg Hx    Eczema Neg Hx    Immunodeficiency Neg Hx    Urticaria Neg Hx      Social History   Socioeconomic History   Marital status: Married    Spouse name: Not on file   Number of children: 3   Years of education: Not on file   Highest education level: Not on file  Occupational History   Occupation: Retired    Comment: Patent attorney  Tobacco Use   Smoking status: Never   Smokeless tobacco: Never  Vaping Use   Vaping Use: Never used  Substance and Sexual Activity   Alcohol use: Yes    Alcohol/week: 0.0 standard drinks    Comment: 1-2 drinks/month   Drug use: No   Sexual activity: Not on file  Other Topics Concern   Not on file  Social History Narrative   Originally from [place]. Moved to [place] in 1996. Has also lived in [place] state. Has traveled to [place], [place], [place], [place], [place], [place], [place], [place], [place], [place], [place], [place], [place], [place], [place], [place], [place], [place], & [place]. 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 No pets currently. No bird, mold or hot tub exposure. Enjoys exercising regularly, fishing & traveling.    Social Determinants of Health   Financial Resource Strain: Not on file  Food Insecurity: Not on file  Transportation Needs: Not on file  Physical Activity: Not on file  Stress: Not on file  Social Connections: Not on file  Intimate Partner Violence: Not on file     BP 132/70   Pulse 62   Ht 6\' 1"  (1.854 m)   Wt 232 lb (105.2 kg)   SpO2 97%   BMI 30.61 kg/m   Physical Exam:  Well appearing NAD HEENT: Unremarkable Neck:  No JVD, no thyromegally Lymphatics:  No adenopathy Back:  No CVA tenderness Lungs:  Clear with no wheezes HEART:   Regular rate rhythm, no murmurs, no rubs, no clicks Abd:  soft, positive bowel sounds, no organomegally, no rebound, no guarding Ext:  2 plus pulses, no edema, no cyanosis, no clubbing Skin:  No rashes no nodules Neuro:  CN II through XII intact, motor grossly intact  EKG - NSR with  PVC's  DEVICE  Normal device function.  See PaceArt for details.   Assess/Plan:  1. PVC's - he is asymptomatic. I encouraged him to reduce is intake of caffeine. He is to undergo watchful waiting.  2. Atrial flutter - no evidence of recurrent atrial arrhythmias. We will follow. 3. HTN - his bp is elevated but he did not take his meds this morning.   Carleene Overlie Marshell Rieger,MD

## 2021-08-13 DIAGNOSIS — E1140 Type 2 diabetes mellitus with diabetic neuropathy, unspecified: Secondary | ICD-10-CM | POA: Diagnosis not present

## 2021-08-13 DIAGNOSIS — Z79899 Other long term (current) drug therapy: Secondary | ICD-10-CM | POA: Diagnosis not present

## 2021-08-13 DIAGNOSIS — B351 Tinea unguium: Secondary | ICD-10-CM | POA: Diagnosis not present

## 2021-08-13 DIAGNOSIS — E119 Type 2 diabetes mellitus without complications: Secondary | ICD-10-CM | POA: Diagnosis not present

## 2021-09-01 DIAGNOSIS — I10 Essential (primary) hypertension: Secondary | ICD-10-CM | POA: Diagnosis not present

## 2021-09-01 DIAGNOSIS — R0683 Snoring: Secondary | ICD-10-CM | POA: Diagnosis not present

## 2021-09-01 DIAGNOSIS — G4719 Other hypersomnia: Secondary | ICD-10-CM | POA: Diagnosis not present

## 2021-09-01 DIAGNOSIS — R0681 Apnea, not elsewhere classified: Secondary | ICD-10-CM | POA: Diagnosis not present

## 2021-09-12 DIAGNOSIS — G4733 Obstructive sleep apnea (adult) (pediatric): Secondary | ICD-10-CM | POA: Diagnosis not present

## 2021-09-17 DIAGNOSIS — Z79899 Other long term (current) drug therapy: Secondary | ICD-10-CM | POA: Diagnosis not present

## 2021-09-17 DIAGNOSIS — B351 Tinea unguium: Secondary | ICD-10-CM | POA: Diagnosis not present

## 2021-09-24 DIAGNOSIS — N183 Chronic kidney disease, stage 3 unspecified: Secondary | ICD-10-CM | POA: Diagnosis not present

## 2021-09-24 DIAGNOSIS — E1165 Type 2 diabetes mellitus with hyperglycemia: Secondary | ICD-10-CM | POA: Diagnosis not present

## 2021-09-24 DIAGNOSIS — D638 Anemia in other chronic diseases classified elsewhere: Secondary | ICD-10-CM | POA: Diagnosis not present

## 2021-09-24 DIAGNOSIS — Z905 Acquired absence of kidney: Secondary | ICD-10-CM | POA: Diagnosis not present

## 2021-09-24 DIAGNOSIS — M069 Rheumatoid arthritis, unspecified: Secondary | ICD-10-CM | POA: Diagnosis not present

## 2021-09-24 DIAGNOSIS — I10 Essential (primary) hypertension: Secondary | ICD-10-CM | POA: Diagnosis not present

## 2021-10-08 DIAGNOSIS — G4733 Obstructive sleep apnea (adult) (pediatric): Secondary | ICD-10-CM | POA: Diagnosis not present

## 2021-11-25 DIAGNOSIS — E221 Hyperprolactinemia: Secondary | ICD-10-CM | POA: Diagnosis not present

## 2021-11-25 DIAGNOSIS — E7800 Pure hypercholesterolemia, unspecified: Secondary | ICD-10-CM | POA: Diagnosis not present

## 2021-11-25 DIAGNOSIS — E1169 Type 2 diabetes mellitus with other specified complication: Secondary | ICD-10-CM | POA: Diagnosis not present

## 2021-11-25 DIAGNOSIS — N644 Mastodynia: Secondary | ICD-10-CM | POA: Diagnosis not present

## 2021-11-25 DIAGNOSIS — E559 Vitamin D deficiency, unspecified: Secondary | ICD-10-CM | POA: Diagnosis not present

## 2021-11-25 DIAGNOSIS — I10 Essential (primary) hypertension: Secondary | ICD-10-CM | POA: Diagnosis not present

## 2021-11-25 DIAGNOSIS — R899 Unspecified abnormal finding in specimens from other organs, systems and tissues: Secondary | ICD-10-CM | POA: Diagnosis not present

## 2021-11-25 DIAGNOSIS — R799 Abnormal finding of blood chemistry, unspecified: Secondary | ICD-10-CM | POA: Diagnosis not present

## 2021-11-25 DIAGNOSIS — E229 Hyperfunction of pituitary gland, unspecified: Secondary | ICD-10-CM | POA: Diagnosis not present

## 2021-11-25 DIAGNOSIS — Z125 Encounter for screening for malignant neoplasm of prostate: Secondary | ICD-10-CM | POA: Diagnosis not present

## 2021-11-27 ENCOUNTER — Other Ambulatory Visit: Payer: Self-pay | Admitting: Family Medicine

## 2021-11-27 DIAGNOSIS — R899 Unspecified abnormal finding in specimens from other organs, systems and tissues: Secondary | ICD-10-CM

## 2021-11-29 ENCOUNTER — Other Ambulatory Visit: Payer: Self-pay | Admitting: Family Medicine

## 2021-11-29 DIAGNOSIS — N644 Mastodynia: Secondary | ICD-10-CM

## 2021-12-02 ENCOUNTER — Other Ambulatory Visit: Payer: Self-pay

## 2021-12-02 ENCOUNTER — Ambulatory Visit
Admission: RE | Admit: 2021-12-02 | Discharge: 2021-12-02 | Disposition: A | Discharge status: Home / Self Care | Payer: Medicare HMO | Source: Ambulatory Visit | Attending: Family Medicine | Admitting: Family Medicine

## 2021-12-02 DIAGNOSIS — R899 Unspecified abnormal finding in specimens from other organs, systems and tissues: Secondary | ICD-10-CM

## 2021-12-02 DIAGNOSIS — E221 Hyperprolactinemia: Secondary | ICD-10-CM | POA: Diagnosis not present

## 2021-12-02 DIAGNOSIS — E237 Disorder of pituitary gland, unspecified: Secondary | ICD-10-CM | POA: Diagnosis not present

## 2021-12-02 DIAGNOSIS — J3489 Other specified disorders of nose and nasal sinuses: Secondary | ICD-10-CM | POA: Diagnosis not present

## 2021-12-02 MED ORDER — GADOBENATE DIMEGLUMINE 529 MG/ML IV SOLN
10.0000 mL | Freq: Once | INTRAVENOUS | Status: AC | PRN
  Administered 2021-12-02: 10 mL via INTRAVENOUS

## 2021-12-03 DIAGNOSIS — N183 Chronic kidney disease, stage 3 unspecified: Secondary | ICD-10-CM | POA: Diagnosis not present

## 2021-12-03 DIAGNOSIS — I10 Essential (primary) hypertension: Secondary | ICD-10-CM | POA: Diagnosis not present

## 2021-12-05 DIAGNOSIS — I4891 Unspecified atrial fibrillation: Secondary | ICD-10-CM | POA: Diagnosis not present

## 2021-12-05 DIAGNOSIS — E669 Obesity, unspecified: Secondary | ICD-10-CM | POA: Diagnosis not present

## 2021-12-05 DIAGNOSIS — N390 Urinary tract infection, site not specified: Secondary | ICD-10-CM | POA: Diagnosis not present

## 2021-12-05 DIAGNOSIS — I10 Essential (primary) hypertension: Secondary | ICD-10-CM | POA: Diagnosis not present

## 2021-12-05 DIAGNOSIS — N183 Chronic kidney disease, stage 3 unspecified: Secondary | ICD-10-CM | POA: Diagnosis not present

## 2021-12-08 DIAGNOSIS — G4733 Obstructive sleep apnea (adult) (pediatric): Secondary | ICD-10-CM | POA: Diagnosis not present

## 2021-12-11 ENCOUNTER — Ambulatory Visit
Admission: RE | Admit: 2021-12-11 | Discharge: 2021-12-11 | Disposition: A | Discharge status: Home / Self Care | Payer: Medicare HMO | Source: Ambulatory Visit | Attending: Family Medicine | Admitting: Family Medicine

## 2021-12-11 DIAGNOSIS — N644 Mastodynia: Secondary | ICD-10-CM

## 2021-12-11 DIAGNOSIS — N62 Hypertrophy of breast: Secondary | ICD-10-CM | POA: Diagnosis not present

## 2021-12-11 DIAGNOSIS — R922 Inconclusive mammogram: Secondary | ICD-10-CM | POA: Diagnosis not present

## 2021-12-24 DIAGNOSIS — N183 Chronic kidney disease, stage 3 unspecified: Secondary | ICD-10-CM | POA: Diagnosis not present

## 2021-12-24 DIAGNOSIS — M0579 Rheumatoid arthritis with rheumatoid factor of multiple sites without organ or systems involvement: Secondary | ICD-10-CM | POA: Diagnosis not present

## 2021-12-24 DIAGNOSIS — E669 Obesity, unspecified: Secondary | ICD-10-CM | POA: Diagnosis not present

## 2021-12-24 DIAGNOSIS — Z6831 Body mass index (BMI) 31.0-31.9, adult: Secondary | ICD-10-CM | POA: Diagnosis not present

## 2021-12-24 DIAGNOSIS — R5382 Chronic fatigue, unspecified: Secondary | ICD-10-CM | POA: Diagnosis not present

## 2022-02-04 ENCOUNTER — Other Ambulatory Visit: Payer: Self-pay | Admitting: Internal Medicine

## 2022-02-10 DIAGNOSIS — R7989 Other specified abnormal findings of blood chemistry: Secondary | ICD-10-CM | POA: Diagnosis not present

## 2022-02-10 DIAGNOSIS — E7800 Pure hypercholesterolemia, unspecified: Secondary | ICD-10-CM | POA: Diagnosis not present

## 2022-02-10 DIAGNOSIS — E1169 Type 2 diabetes mellitus with other specified complication: Secondary | ICD-10-CM | POA: Diagnosis not present

## 2022-02-10 DIAGNOSIS — Z125 Encounter for screening for malignant neoplasm of prostate: Secondary | ICD-10-CM | POA: Diagnosis not present

## 2022-02-10 DIAGNOSIS — E559 Vitamin D deficiency, unspecified: Secondary | ICD-10-CM | POA: Diagnosis not present

## 2022-02-10 DIAGNOSIS — I10 Essential (primary) hypertension: Secondary | ICD-10-CM | POA: Diagnosis not present

## 2022-02-13 ENCOUNTER — Ambulatory Visit: Payer: Medicare HMO | Admitting: Podiatry

## 2022-02-13 DIAGNOSIS — I499 Cardiac arrhythmia, unspecified: Secondary | ICD-10-CM | POA: Insufficient documentation

## 2022-02-13 DIAGNOSIS — K5730 Diverticulosis of large intestine without perforation or abscess without bleeding: Secondary | ICD-10-CM | POA: Insufficient documentation

## 2022-02-13 DIAGNOSIS — Z85528 Personal history of other malignant neoplasm of kidney: Secondary | ICD-10-CM | POA: Insufficient documentation

## 2022-02-13 DIAGNOSIS — N529 Male erectile dysfunction, unspecified: Secondary | ICD-10-CM | POA: Insufficient documentation

## 2022-02-13 DIAGNOSIS — M2012 Hallux valgus (acquired), left foot: Secondary | ICD-10-CM

## 2022-02-13 DIAGNOSIS — M19071 Primary osteoarthritis, right ankle and foot: Secondary | ICD-10-CM

## 2022-02-13 DIAGNOSIS — M19072 Primary osteoarthritis, left ankle and foot: Secondary | ICD-10-CM | POA: Diagnosis not present

## 2022-02-13 DIAGNOSIS — K5900 Constipation, unspecified: Secondary | ICD-10-CM | POA: Insufficient documentation

## 2022-02-13 DIAGNOSIS — M79674 Pain in right toe(s): Secondary | ICD-10-CM

## 2022-02-13 DIAGNOSIS — Z860101 Personal history of adenomatous and serrated colon polyps: Secondary | ICD-10-CM | POA: Insufficient documentation

## 2022-02-13 DIAGNOSIS — E1142 Type 2 diabetes mellitus with diabetic polyneuropathy: Secondary | ICD-10-CM | POA: Diagnosis not present

## 2022-02-13 DIAGNOSIS — H35039 Hypertensive retinopathy, unspecified eye: Secondary | ICD-10-CM | POA: Insufficient documentation

## 2022-02-13 DIAGNOSIS — R196 Halitosis: Secondary | ICD-10-CM | POA: Insufficient documentation

## 2022-02-13 DIAGNOSIS — Z860100 Personal history of colon polyps, unspecified: Secondary | ICD-10-CM | POA: Insufficient documentation

## 2022-02-13 DIAGNOSIS — M79675 Pain in left toe(s): Secondary | ICD-10-CM | POA: Diagnosis not present

## 2022-02-13 DIAGNOSIS — Z8679 Personal history of other diseases of the circulatory system: Secondary | ICD-10-CM | POA: Insufficient documentation

## 2022-02-13 DIAGNOSIS — R400 Somnolence: Secondary | ICD-10-CM | POA: Insufficient documentation

## 2022-02-13 DIAGNOSIS — E119 Type 2 diabetes mellitus without complications: Secondary | ICD-10-CM

## 2022-02-13 DIAGNOSIS — G609 Hereditary and idiopathic neuropathy, unspecified: Secondary | ICD-10-CM

## 2022-02-13 DIAGNOSIS — B351 Tinea unguium: Secondary | ICD-10-CM

## 2022-02-13 DIAGNOSIS — G4733 Obstructive sleep apnea (adult) (pediatric): Secondary | ICD-10-CM | POA: Insufficient documentation

## 2022-02-13 DIAGNOSIS — J45909 Unspecified asthma, uncomplicated: Secondary | ICD-10-CM | POA: Insufficient documentation

## 2022-02-13 DIAGNOSIS — K259 Gastric ulcer, unspecified as acute or chronic, without hemorrhage or perforation: Secondary | ICD-10-CM | POA: Insufficient documentation

## 2022-02-13 DIAGNOSIS — R7989 Other specified abnormal findings of blood chemistry: Secondary | ICD-10-CM | POA: Insufficient documentation

## 2022-02-13 DIAGNOSIS — D696 Thrombocytopenia, unspecified: Secondary | ICD-10-CM | POA: Insufficient documentation

## 2022-02-13 DIAGNOSIS — E7800 Pure hypercholesterolemia, unspecified: Secondary | ICD-10-CM | POA: Insufficient documentation

## 2022-02-13 DIAGNOSIS — M2011 Hallux valgus (acquired), right foot: Secondary | ICD-10-CM

## 2022-02-13 DIAGNOSIS — Z125 Encounter for screening for malignant neoplasm of prostate: Secondary | ICD-10-CM | POA: Insufficient documentation

## 2022-02-13 MED ORDER — DULOXETINE HCL 20 MG PO CPEP: 20.0000 mg | ORAL_CAPSULE | Freq: Every day | ORAL | 0 refills | Status: DC

## 2022-02-13 MED ORDER — GABAPENTIN 300 MG PO CAPS: 300.0000 mg | ORAL_CAPSULE | Freq: Every day | ORAL | 0 refills | Status: DC

## 2022-02-13 MED ORDER — CICLOPIROX 8 % EX SOLN
CUTANEOUS | 11 refills | Status: DC
Start: 2022-02-13 — End: 2022-05-19

## 2022-02-13 NOTE — Patient Instructions (Signed)
Onychomycosis/Fungal Toenails ? ?WHAT IS IT? An infection that lies within the keratin of your nail plate that is caused by a fungus. ? ?WHY ME? Fungal infections affect all ages, sexes, races, and creeds.  There may be many factors that predispose you to a fungal infection such as age, coexisting medical conditions such as diabetes, or an autoimmune disease; stress, medications, fatigue, genetics, etc.  Bottom line: fungus thrives in a warm, moist environment and your shoes offer such a location. ? ?IS IT CONTAGIOUS? Theoretically, yes.  You do not want to share shoes, nail clippers or files with someone who has fungal toenails.  Walking around barefoot in the same room or sleeping in the same bed is unlikely to transfer the organism.  It is important to realize, however, that fungus can spread easily from one nail to the next on the same foot. ? ?HOW DO WE TREAT THIS?  There are several ways to treat this condition.  Treatment may depend on many factors such as age, medications, pregnancy, liver and kidney conditions, etc.  It is best to ask your doctor which options are available to you. ? ?No treatment.   Unlike many other medical concerns, you can live with this condition.  However for many people this can be a painful condition and may lead to ingrown toenails or a bacterial infection.  It is recommended that you keep the nails cut short to help reduce the amount of fungal nail. ?Topical treatment.  These range from herbal remedies to prescription strength nail lacquers.  About 40-50% effective, topicals require twice daily application for approximately 9 to 12 months or until an entirely new nail has grown out.  The most effective topicals are medical grade medications available through physicians offices. ?Oral antifungal medications.  With an 80-90% cure rate, the most common oral medication requires 3 to 4 months of therapy and stays in your system for a year as the new nail grows out.  Oral antifungal  medications do require blood work to make sure it is a safe drug for you.  A liver function panel will be performed prior to starting the medication and after the first month of treatment.  It is important to have the blood work performed to avoid any harmful side effects.  In general, this medication safe but blood work is required. ?Laser Therapy.  This treatment is performed by applying a specialized laser to the affected nail plate.  This therapy is noninvasive, fast, and non-painful.  It is not covered by insurance and is therefore, out of pocket.  The results have been very good with a 80-95% cure rate.  [facility] is the only practice in the area to offer this therapy. ?Permanent Nail Avulsion.  Removing the entire nail so that a new nail will not grow back. ? ?Preventing Toenail Fungus from Recurring ? ?Sanitize your shoes with Mycomist spray or a similar shoe sanitizer spray.  Follow the instructions on the bottle and dry them outside in the sun or with a hairdryer.  We also recommend repeating the sanitization once weekly in shoes you wear most often. ? ?Throw away any shoes you have worn a significant amount without socks-fungus thrives in a warm moist environment and you want to avoid re-infection after your laser procedure ? ?Bleach your socks with regular or color safe bleach ? ?Change your socks regularly to keep your feet clean and dry (especially if you have sweaty feet)-if sweaty feet are a problem, let  your doctor know-there is a great lotion that helps with this problem. ? ?Clean your toenail clippers with alcohol before you use them if you do your own toenails and make sure to replace [employer] and orange sticks regularly ? ?

## 2022-02-19 DIAGNOSIS — J309 Allergic rhinitis, unspecified: Secondary | ICD-10-CM | POA: Diagnosis not present

## 2022-02-19 DIAGNOSIS — N529 Male erectile dysfunction, unspecified: Secondary | ICD-10-CM | POA: Diagnosis not present

## 2022-02-19 DIAGNOSIS — E7800 Pure hypercholesterolemia, unspecified: Secondary | ICD-10-CM | POA: Diagnosis not present

## 2022-02-19 DIAGNOSIS — I499 Cardiac arrhythmia, unspecified: Secondary | ICD-10-CM | POA: Diagnosis not present

## 2022-02-19 DIAGNOSIS — E1169 Type 2 diabetes mellitus with other specified complication: Secondary | ICD-10-CM | POA: Diagnosis not present

## 2022-02-19 DIAGNOSIS — I10 Essential (primary) hypertension: Secondary | ICD-10-CM | POA: Diagnosis not present

## 2022-02-19 DIAGNOSIS — E559 Vitamin D deficiency, unspecified: Secondary | ICD-10-CM | POA: Diagnosis not present

## 2022-02-19 DIAGNOSIS — Z6831 Body mass index (BMI) 31.0-31.9, adult: Secondary | ICD-10-CM | POA: Diagnosis not present

## 2022-02-19 DIAGNOSIS — Z0000 Encounter for general adult medical examination without abnormal findings: Secondary | ICD-10-CM | POA: Diagnosis not present

## 2022-02-21 ENCOUNTER — Encounter: Payer: Self-pay | Admitting: Podiatry

## 2022-02-21 DIAGNOSIS — I499 Cardiac arrhythmia, unspecified: Secondary | ICD-10-CM | POA: Insufficient documentation

## 2022-02-21 DIAGNOSIS — E559 Vitamin D deficiency, unspecified: Secondary | ICD-10-CM | POA: Insufficient documentation

## 2022-02-21 DIAGNOSIS — Z6831 Body mass index (BMI) 31.0-31.9, adult: Secondary | ICD-10-CM | POA: Insufficient documentation

## 2022-02-21 NOTE — Progress Notes (Signed)
Subjective: Paul Kitchens Sr. presents today for diabetic foot evaluation.  Patient relates he has h/o prediabetes.   Patient denies any h/o foot wounds.  Patient has been diagnosed with neuropathy and it is managed with  gabapentin and duloxetine. He has been using this regimen and it controls his neuropathic pain .  Last known HgA1c was 5.6%. Patient does not monitor blood glucose daily.  Risk factors: diabetes, diabetic neuropathy, HTN, CKD, hypercholesterolemia.  PCP is [name], MD , and last visit was Feb 10, 2022.  Past Medical History:  Diagnosis Date   Abnormal EKG    Anemia    Atrial fibrillation (HCC)    CKD (chronic kidney disease), stage III ([place])    Essential hypertension 07/18/2009   Qualifier: Diagnosis of  By: [name], [name]     Fatigue    Hypertension    KIDNEY CANCER 07/18/2009   s/p right neprectomy   Moderate persistent asthma 07/18/2009        Nasal polyposis 12/16/2016   Onychomycosis 12/19/2014   Pain in lower limb 12/19/2014   Palpitations    Perennial allergic rhinitis with nonallergic component 06/17/2016   PVC (premature ventricular contraction)    Sinusitis, maxillary, chronic 11/09/2012    Patient Active Problem List   Diagnosis Date Noted   Vitamin D deficiency 02/21/2022   Cardiac arrhythmia 02/21/2022   Body mass index (BMI) 31.0-31.9, adult 02/21/2022   Constipation 02/13/2022   Daytime somnolence 02/13/2022   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   ED (erectile dysfunction) of organic origin 02/13/2022   Gastric ulcer 02/13/2022   Halitosis 02/13/2022   High cholesterol 02/13/2022   History of atrial flutter 02/13/2022   Hypertensive retinopathy 02/13/2022   Irregular heart beat 02/13/2022   Mild asthma 02/13/2022   Obstructive sleep apnea 02/13/2022   Other specified abnormal findings of blood chemistry 02/13/2022   Personal history of colonic polyps 02/13/2022   Personal history of other malignant neoplasm of  kidney 02/13/2022   Screening for malignant neoplasm of prostate 02/13/2022   Thrombocytopenia ([place]) 02/13/2022   Type 2 diabetes mellitus with diabetic neuropathy, without long-term current use of insulin ([place]) 04/25/2020   Acute idiopathic gout of left hand 11/29/2019   Primary osteoarthritis of both knees 11/29/2019   Low back pain 11/03/2019   GIB (gastrointestinal bleeding) 10/18/2019   Chronic anticoagulation 10/18/2019   Melena 10/18/2019   Hematemesis 10/18/2019   Anemia 10/18/2019   GI bleed 10/18/2019   Abnormal EKG    PVC (premature ventricular contraction)    CKD (chronic kidney disease), stage III (HCC)    Unspecified atrial flutter ([place]) 03/28/2019   PAC (premature atrial contraction) 12/21/2018   Deviated nasal septum 05/25/2017   Seasonal allergic rhinitis due to pollen 05/25/2017   De Quervain's tenosynovitis, left 04/01/2017   Left wrist pain 04/01/2017   Nasal polyposis 12/16/2016   Perennial allergic rhinitis with nonallergic component 06/17/2016   Onychomycosis 12/19/2014   Pain in lower limb 12/19/2014   Sinusitis, maxillary, chronic 11/09/2012   KIDNEY CANCER 07/18/2009   Essential hypertension 07/18/2009   Moderate persistent asthma 07/18/2009    Past Surgical History:  Procedure Laterality Date   A-FLUTTER ABLATION N/A 05/08/2019   Procedure: A-FLUTTER ABLATION;  Surgeon: [name], MD;  Location: [facility];  Service: Cardiovascular;  Laterality: N/A;   ESOPHAGOGASTRODUODENOSCOPY (EGD) WITH PROPOFOL Left 10/19/2019   Procedure: ESOPHAGOGASTRODUODENOSCOPY (EGD) WITH PROPOFOL;  Surgeon: [name], MD;  Location: [facility];  Service: Endoscopy;  Laterality: Left;   HEMORRHOID SURGERY     right ankle surgery     right hip replacement     right kidney removed  2000   for renal ca   SINOSCOPY      Current Outpatient Medications on File Prior to Visit  Medication Sig Dispense Refill   amLODipine (NORVASC) 5 MG tablet Take 5 mg by  mouth every evening.      bromocriptine (PARLODEL) 2.5 MG tablet Take 2.5 mg by mouth daily.     budesonide (PULMICORT) 0.5 MG/2ML nebulizer solution Add entire contents of one vial of budesonide to a saline nasal spray bottle, then add saline solution and mix together. Use twice a day as needed for nasal congestion and nasal polyps. 120 mL 3   budesonide-formoterol (SYMBICORT) 160-4.5 MCG/ACT inhaler Inhale 2 puffs into the lungs as needed.      Cholecalciferol (VITAMIN D3) 50 MCG (2000 UT) capsule Take 2,000 Units by mouth daily.     dapagliflozin propanediol (FARXIGA) 10 MG TABS tablet Take 1 tablet by mouth daily.     ENBREL SURECLICK 50 MG/ML injection Inject into the skin.     fluticasone (FLONASE) 50 MCG/ACT nasal spray Place into both nostrils as needed.     fluticasone (FLOVENT HFA) 110 MCG/ACT inhaler For asthma flare, begin Flovent 110-2 puffs twice a day with a spacer for 2 weeks or until cough and wheeze free 1 each 3   glipiZIDE (GLUCOTROL XL) 2.5 MG 24 hr tablet Take 1 tablet by mouth daily with breakfast.     hydrALAZINE (APRESOLINE) 100 MG tablet Take 100 mg by mouth 3 (three) times daily.     hydrochlorothiazide (HYDRODIURIL) 12.5 MG tablet Take 12.5 mg by mouth every Monday, Wednesday, and Friday.     hydroxychloroquine (PLAQUENIL) 200 MG tablet Take 1 tablet by mouth 2 (two) times daily.     levalbuterol (XOPENEX HFA) 45 MCG/ACT inhaler INHALE 2 PUFFS BY MOUTH EVERY 6 HOURS AS NEEDED FOR WHEEZING     metoprolol tartrate (LOPRESSOR) 25 MG tablet TAKE 1 & 1/2 (ONE & ONE-HALF) TABLETS BY MOUTH TWICE DAILY 270 tablet 1   pravastatin (PRAVACHOL) 20 MG tablet Take 1 tablet by mouth once a week. On hold per ENDO     sildenafil (VIAGRA) 100 MG tablet Take 50-100 mg by mouth daily as needed for erectile dysfunction.     No current facility-administered medications on file prior to visit.     Allergies  Allergen Reactions   Altace [Ramipril] Other (See Comments)    Patient is  unaware of allergy   Bromocriptine     Other reaction(s): ankle  edema   Cozaar [Losartan Potassium] Other (See Comments)    Patient is unaware of allergy    Social History   Occupational History   Occupation: Retired    Comment: Patent attorney  Tobacco Use   Smoking status: Never   Smokeless tobacco: Never  Vaping Use   Vaping Use: Never used  Substance and Sexual Activity   Alcohol use: Yes    Alcohol/week: 0.0 standard drinks    Comment: 1-2 drinks/month   Drug use: No   Sexual activity: Not on file    Family History  Problem Relation Age of Onset   Hypertension Mother    Diabetes Mother    Heart attack Mother    Unexplained death Father    Colon cancer Sister    Diabetes Brother    Alcoholism Brother    Colon cancer  Sister    Healthy Son    Healthy Son    Healthy Son    Lung disease Neg Hx    Allergic rhinitis Neg Hx    Angioedema Neg Hx    Asthma Neg Hx    Eczema Neg Hx    Immunodeficiency Neg Hx    Urticaria Neg Hx     Immunization History  Administered Date(s) Administered   Influenza Split 06/15/2012   Influenza Whole 07/16/2009   Influenza, High Dose Seasonal PF 07/05/2016   PFIZER(Purple Top)SARS-COV-2 Vaccination 11/13/2019, 12/08/2019   Pneumococcal Conjugate-13 10/08/2016   Pneumococcal Polysaccharide-23 07/16/2009    Objective: There were no vitals filed for this visit.  Paul Kitchens Sr. is a pleasant 76 y.o. male WD, WN in NAD. AAO X 3.  Vascular Examination: CFT immediate b/l LE. Palpable DP/PT pulses b/l LE. Digital hair present b/l. Skin temperature gradient WNL b/l. No pain with calf compression b/l. No edema noted b/l. No cyanosis or clubbing noted b/l LE.  Dermatological Examination: Pedal integument with normal turgor, texture and tone b/l LE. No open wounds b/l. No interdigital macerations b/l. Toenails 1-5 b/l elongated, thickened, discolored with subungual debris. +Tenderness with dorsal palpation of nailplates. No  hyperkeratotic or porokeratotic lesions present.  Neurological Examination: Pt has subjective symptoms of neuropathy. Protective sensation intact 5/5 intact bilaterally with 10g monofilament b/l. Vibratory sensation intact b/l. Proprioception intact bilaterally.  Musculoskeletal Examination: Normal muscle strength 5/5 to all lower extremity muscle groups bilaterally. Palpable exostosis noted 1st metatarsocuneiform joint of b/l lower extremities. HAV with bunion deformity noted b/l LE. Patient ambulates independent of any assistive aids.. No pain, crepitus or joint limitation noted with ROM b/l LE.  Patient ambulates independently without assistive aids.  Footwear Assessment: Does the patient wear appropriate shoes? Yes. Does the patient need inserts/orthotics? No.  Assessment: 1. Pain due to onychomycosis of toenails of both feet   2. Idiopathic peripheral neuropathy   3. Hallux valgus, acquired, bilateral   4. Primary osteoarthritis of both feet   5. Diabetic peripheral neuropathy associated with type 2 diabetes mellitus ([place])   6. Encounter for diabetic foot exam ([place])     ADA Risk Categorization: Low Risk:  Patient has all of the following: Intact protective sensation No prior foot ulcer  No severe deformity Pedal pulses present  Plan: -Patient was evaluated and treated. All patient's and/or POA's questions/concerns answered on today's visit. -Diabetic foot examination performed today. -Continue foot and shoe inspections daily. Monitor blood glucose per PCP/Endocrinologist's recommendations. -Patient to continue soft, supportive shoe gear daily. -Discussed topical, laser and oral medication. Patient opted for topical treatment. Rx sent to pharmacy for 8% Ciclopirox Solution.  Apply one coat to each toenail once daily for 48 weeks. Remove once weekly with nail polish remover. -For neuropathy, refilled his duloxetine 20 mg: take one by mouth every morning. Refilled gabapentin 300  mg: take one by mouth at bedtime. -Patient/POA to call should there be question/concern in the interim.  Return in about 3 months (around 05/16/2022).  Marzetta Board, DPM

## 2022-03-11 DIAGNOSIS — G4733 Obstructive sleep apnea (adult) (pediatric): Secondary | ICD-10-CM | POA: Diagnosis not present

## 2022-04-20 ENCOUNTER — Other Ambulatory Visit: Payer: Self-pay | Admitting: Podiatry

## 2022-04-20 DIAGNOSIS — E1142 Type 2 diabetes mellitus with diabetic polyneuropathy: Secondary | ICD-10-CM

## 2022-05-15 DIAGNOSIS — E221 Hyperprolactinemia: Secondary | ICD-10-CM | POA: Diagnosis not present

## 2022-05-15 DIAGNOSIS — R946 Abnormal results of thyroid function studies: Secondary | ICD-10-CM | POA: Diagnosis not present

## 2022-05-15 DIAGNOSIS — D352 Benign neoplasm of pituitary gland: Secondary | ICD-10-CM | POA: Diagnosis not present

## 2022-05-19 ENCOUNTER — Ambulatory Visit (INDEPENDENT_AMBULATORY_CARE_PROVIDER_SITE_OTHER): Payer: Medicare HMO | Admitting: Podiatry

## 2022-05-19 ENCOUNTER — Encounter: Payer: Self-pay | Admitting: Podiatry

## 2022-05-19 DIAGNOSIS — B351 Tinea unguium: Secondary | ICD-10-CM

## 2022-05-19 DIAGNOSIS — M79674 Pain in right toe(s): Secondary | ICD-10-CM | POA: Diagnosis not present

## 2022-05-19 DIAGNOSIS — E221 Hyperprolactinemia: Secondary | ICD-10-CM | POA: Insufficient documentation

## 2022-05-19 DIAGNOSIS — M79675 Pain in left toe(s): Secondary | ICD-10-CM

## 2022-05-19 DIAGNOSIS — E1142 Type 2 diabetes mellitus with diabetic polyneuropathy: Secondary | ICD-10-CM

## 2022-05-19 DIAGNOSIS — G4733 Obstructive sleep apnea (adult) (pediatric): Secondary | ICD-10-CM | POA: Diagnosis not present

## 2022-05-19 NOTE — Progress Notes (Signed)
  Subjective:  Patient ID: Paul Kitchens Sr., male    DOB: 1946/08/22,  MRN: [id number]  Paul Kitchens Sr. presents to clinic today for painful elongated mycotic toenails 1-5 bilaterally which are tender when wearing enclosed shoe gear. Pain is relieved with periodic professional debridement.  Patient states he does not like performing the Penlac therapy. He would like to discuss other treatments available for onychomycosis.  New problem(s): None.   PCP is [name], MD , and last visit was  Feb 19, 2022  Allergies  Allergen Reactions   Altace [Ramipril] Other (See Comments)    Patient is unaware of allergy   Bromocriptine     Other reaction(s): ankle  edema   Cozaar [Losartan Potassium] Other (See Comments)    Patient is unaware of allergy    Review of Systems: Negative except as noted in the HPI.  Objective: No changes noted in today's physical examination. There were no vitals filed for this visit.  Paul Kitchens Sr. is a pleasant 76 y.o. male WD, WN in NAD. AAO X 3.  Vascular Examination: CFT immediate b/l LE. Palpable DP/PT pulses b/l LE. Digital hair present b/l. Skin temperature gradient WNL b/l. No pain with calf compression b/l. No edema noted b/l. No cyanosis or clubbing noted b/l LE.  Dermatological Examination: Pedal integument with normal turgor, texture and tone b/l LE. No open wounds b/l. No interdigital macerations b/l. Toenails 1-5 b/l elongated, thickened, discolored with subungual debris. +Tenderness with dorsal palpation of nailplates. No hyperkeratotic or porokeratotic lesions present.  Neurological Examination: Pt has subjective symptoms of neuropathy. Protective sensation intact 5/5 intact bilaterally with 10g monofilament b/l. Vibratory sensation intact b/l. Proprioception intact bilaterally.  Musculoskeletal Examination: Normal muscle strength 5/5 to all lower extremity muscle groups bilaterally. Palpable exostosis noted 1st metatarsocuneiform  joint of b/l lower extremities. HAV with bunion deformity noted b/l LE. Patient ambulates independent of any assistive aids.. No pain, crepitus or joint limitation noted with ROM b/l LE.  Patient ambulates independently without assistive aids.  Assessment/Plan: 1. Pain due to onychomycosis of toenails of both feet   2. Diabetic peripheral neuropathy associated with type 2 diabetes mellitus ([place])     -Examined patient. -Discontinue Penlac therapy per patient request. -Discussed treatment options for onychomycosis. Patient opted for topical OTC therapy. Patient is to apply 1 drop of tea tree oil to affected toenail(s) once daily. -Mycotic toenails 1-5 bilaterally were debrided in length and girth with sterile nail nippers and dremel without incident. -Patient/POA to call should there be question/concern in the interim.   Return in about 3 months (around 08/19/2022).  Marzetta Board, DPM

## 2022-05-19 NOTE — Patient Instructions (Signed)
Apply tea tree oil to toenails once daily. More information on laser therapy is described in #4 below.  Onychomycosis/Fungal Toenails  WHAT IS IT? An infection that lies within the keratin of your nail plate that is caused by a fungus.  WHY ME? Fungal infections affect all ages, sexes, races, and creeds.  There may be many factors that predispose you to a fungal infection such as age, coexisting medical conditions such as diabetes, or an autoimmune disease; stress, medications, fatigue, genetics, etc.  Bottom line: fungus thrives in a warm, moist environment and your shoes offer such a location.  IS IT CONTAGIOUS? Theoretically, yes.  You do not want to share shoes, nail clippers or files with someone who has fungal toenails.  Walking around barefoot in the same room or sleeping in the same bed is unlikely to transfer the organism.  It is important to realize, however, that fungus can spread easily from one nail to the next on the same foot.  HOW DO WE TREAT THIS?  There are several ways to treat this condition.  Treatment may depend on many factors such as age, medications, pregnancy, liver and kidney conditions, etc.  It is best to ask your doctor which options are available to you.  No treatment.   Unlike many other medical concerns, you can live with this condition.  However for many people this can be a painful condition and may lead to ingrown toenails or a bacterial infection.  It is recommended that you keep the nails cut short to help reduce the amount of fungal nail. Topical treatment.  These range from herbal remedies to prescription strength nail lacquers.  About 40-50% effective, topicals require twice daily application for approximately 9 to 12 months or until an entirely new nail has grown out.  The most effective topicals are medical grade medications available through physicians offices. Oral antifungal medications.  With an 80-90% cure rate, the most common oral medication requires 3  to 4 months of therapy and stays in your system for a year as the new nail grows out.  Oral antifungal medications do require blood work to make sure it is a safe drug for you.  A liver function panel will be performed prior to starting the medication and after the first month of treatment.  It is important to have the blood work performed to avoid any harmful side effects.  In general, this medication safe but blood work is required. Laser Therapy.  This treatment is performed by applying a specialized laser to the affected nail plate.  This therapy is noninvasive, fast, and non-painful.  It is not covered by insurance and is therefore, out of pocket.  The results have been very good with a 80-95% cure rate.  [facility] is the only practice in the area to offer this therapy. Permanent Nail Avulsion.  Removing the entire nail so that a new nail will not grow back.

## 2022-05-20 DIAGNOSIS — E221 Hyperprolactinemia: Secondary | ICD-10-CM | POA: Diagnosis not present

## 2022-05-20 DIAGNOSIS — E039 Hypothyroidism, unspecified: Secondary | ICD-10-CM | POA: Diagnosis not present

## 2022-05-20 DIAGNOSIS — D352 Benign neoplasm of pituitary gland: Secondary | ICD-10-CM | POA: Diagnosis not present

## 2022-06-01 DIAGNOSIS — D352 Benign neoplasm of pituitary gland: Secondary | ICD-10-CM | POA: Diagnosis not present

## 2022-06-01 DIAGNOSIS — E039 Hypothyroidism, unspecified: Secondary | ICD-10-CM | POA: Diagnosis not present

## 2022-06-03 DIAGNOSIS — J329 Chronic sinusitis, unspecified: Secondary | ICD-10-CM | POA: Diagnosis not present

## 2022-06-03 DIAGNOSIS — R21 Rash and other nonspecific skin eruption: Secondary | ICD-10-CM | POA: Diagnosis not present

## 2022-06-03 DIAGNOSIS — E668 Other obesity: Secondary | ICD-10-CM | POA: Diagnosis not present

## 2022-06-10 DIAGNOSIS — N183 Chronic kidney disease, stage 3 unspecified: Secondary | ICD-10-CM | POA: Diagnosis not present

## 2022-06-17 DIAGNOSIS — N2881 Hypertrophy of kidney: Secondary | ICD-10-CM | POA: Diagnosis not present

## 2022-06-17 DIAGNOSIS — N183 Chronic kidney disease, stage 3 unspecified: Secondary | ICD-10-CM | POA: Diagnosis not present

## 2022-06-17 DIAGNOSIS — N281 Cyst of kidney, acquired: Secondary | ICD-10-CM | POA: Diagnosis not present

## 2022-06-17 DIAGNOSIS — Z905 Acquired absence of kidney: Secondary | ICD-10-CM | POA: Diagnosis not present

## 2022-06-23 DIAGNOSIS — E669 Obesity, unspecified: Secondary | ICD-10-CM | POA: Diagnosis not present

## 2022-06-23 DIAGNOSIS — R21 Rash and other nonspecific skin eruption: Secondary | ICD-10-CM | POA: Diagnosis not present

## 2022-06-23 DIAGNOSIS — M0579 Rheumatoid arthritis with rheumatoid factor of multiple sites without organ or systems involvement: Secondary | ICD-10-CM | POA: Diagnosis not present

## 2022-06-23 DIAGNOSIS — N183 Chronic kidney disease, stage 3 unspecified: Secondary | ICD-10-CM | POA: Diagnosis not present

## 2022-06-23 DIAGNOSIS — R5382 Chronic fatigue, unspecified: Secondary | ICD-10-CM | POA: Diagnosis not present

## 2022-06-23 DIAGNOSIS — Z6830 Body mass index (BMI) 30.0-30.9, adult: Secondary | ICD-10-CM | POA: Diagnosis not present

## 2022-06-29 DIAGNOSIS — E039 Hypothyroidism, unspecified: Secondary | ICD-10-CM | POA: Diagnosis not present

## 2022-07-03 DIAGNOSIS — Z905 Acquired absence of kidney: Secondary | ICD-10-CM | POA: Diagnosis not present

## 2022-07-03 DIAGNOSIS — N390 Urinary tract infection, site not specified: Secondary | ICD-10-CM | POA: Diagnosis not present

## 2022-07-03 DIAGNOSIS — I482 Chronic atrial fibrillation, unspecified: Secondary | ICD-10-CM | POA: Diagnosis not present

## 2022-07-03 DIAGNOSIS — N183 Chronic kidney disease, stage 3 unspecified: Secondary | ICD-10-CM | POA: Diagnosis not present

## 2022-07-03 DIAGNOSIS — I10 Essential (primary) hypertension: Secondary | ICD-10-CM | POA: Diagnosis not present

## 2022-07-28 ENCOUNTER — Other Ambulatory Visit: Payer: Self-pay | Admitting: Internal Medicine

## 2022-08-08 ENCOUNTER — Other Ambulatory Visit: Payer: Self-pay | Admitting: Internal Medicine

## 2022-08-26 ENCOUNTER — Other Ambulatory Visit: Payer: Self-pay | Admitting: Internal Medicine

## 2022-09-01 ENCOUNTER — Ambulatory Visit: Payer: Medicare HMO | Admitting: Podiatry

## 2022-09-01 DIAGNOSIS — D352 Benign neoplasm of pituitary gland: Secondary | ICD-10-CM | POA: Diagnosis not present

## 2022-09-01 DIAGNOSIS — E039 Hypothyroidism, unspecified: Secondary | ICD-10-CM | POA: Diagnosis not present

## 2022-09-03 DIAGNOSIS — H5203 Hypermetropia, bilateral: Secondary | ICD-10-CM | POA: Diagnosis not present

## 2022-09-16 ENCOUNTER — Encounter: Payer: Self-pay | Admitting: Internal Medicine

## 2022-09-16 ENCOUNTER — Ambulatory Visit: Payer: Medicare HMO | Admitting: Internal Medicine

## 2022-09-16 VITALS — BP 122/60 | HR 57 | Temp 97.0°F | Resp 18 | Ht 73.5 in | Wt 234.6 lb

## 2022-09-16 DIAGNOSIS — J4530 Mild persistent asthma, uncomplicated: Secondary | ICD-10-CM | POA: Diagnosis not present

## 2022-09-16 DIAGNOSIS — J329 Chronic sinusitis, unspecified: Secondary | ICD-10-CM

## 2022-09-16 DIAGNOSIS — J339 Nasal polyp, unspecified: Secondary | ICD-10-CM

## 2022-09-16 DIAGNOSIS — J3089 Other allergic rhinitis: Secondary | ICD-10-CM | POA: Diagnosis not present

## 2022-09-16 MED ORDER — FLUTICASONE PROPIONATE HFA 44 MCG/ACT IN AERO: 2.0000 | INHALATION_SPRAY | Freq: Two times a day (BID) | RESPIRATORY_TRACT | 5 refills | Status: DC

## 2022-09-16 MED ORDER — ALBUTEROL SULFATE HFA 108 (90 BASE) MCG/ACT IN AERS: 2.0000 | INHALATION_SPRAY | Freq: Four times a day (QID) | RESPIRATORY_TRACT | 1 refills | Status: DC | PRN

## 2022-09-16 MED ORDER — BUDESONIDE 0.5 MG/2ML IN SUSP: RESPIRATORY_TRACT | 3 refills | Status: DC

## 2022-09-16 MED ORDER — AZELASTINE HCL 0.1 % NA SOLN: 1.0000 | Freq: Two times a day (BID) | NASAL | 5 refills | Status: DC

## 2022-09-16 NOTE — Progress Notes (Signed)
FOLLOW UP Date of Service/Encounter:  09/16/22   Subjective:  Paul Hashem Sr. (DOB: 1946/07/13) is a 76 y.o. male who returns to the [facility] on 09/16/2022 for follow up for asthma, allergic rhinitis and nasal polyps.   History obtained from: chart review and patient. Last seen 08/2020 with Gareth Morgan for moderate persistent asthma, allergic rhinitis and nasal polyps. On Flovent 139mg 2 puffs BID for flare ups, budesonide rinses, azelastine.   [facility] seen for anosmia and loss of taste. Improved with sinusitis therapy, started on Augmentin and Prednisone. Last CT 2018 with extensive paranasal sinus disease and bl obstruction of ostiomeatal unit. Last MRI brain 11/2021 with moderate mucosal thickening throughout the paranasal sinus.    Since last visit, he reports having a lot of congestion, stuffiness but not much drainage or runny nose.  He does have some red eyes.  He was using budesonide rinses previously but has run out a couple of months ago; those were helping with his symptoms.  His sense of smell is poor.  He has had sinus surgery x1 with Dr. DMelida Quitteraround 2016 for nasal polyps.  He has not seen the ENT recently; his PCP did recommend seeing them around 2018 but he did not wish to undergo surgery at the time.    In terms of his asthma, he is doing okay.  He does have some episodes wheezing and trouble breathing.  In the last month, he had these episodes about a few times a week.  He is out of his albuterol inhaler so has not used it to see if it helps.    Past Medical History: Past Medical History:  Diagnosis Date   Abnormal EKG    Anemia    Atrial fibrillation (HCC)    CKD (chronic kidney disease), stage III ([place]    Essential hypertension 07/18/2009   Qualifier: Diagnosis of  By: JRonnald RampRN, [name]     Fatigue    Hypertension    KIDNEY CANCER 07/18/2009   s/p right neprectomy   Moderate persistent asthma  07/18/2009        Nasal polyposis 12/16/2016   Onychomycosis 12/19/2014   Pain in lower limb 12/19/2014   Palpitations    Perennial allergic rhinitis with nonallergic component 06/17/2016   PVC (premature ventricular contraction)    Sinusitis, maxillary, chronic 11/09/2012    Objective:  BP 122/60 (BP Location: Left Arm, Patient Position: Sitting, Cuff Size: Large)   Pulse (!) 57   Temp (!) 97 F (36.1 C) (Temporal)   Resp 18   Ht 6' 1.5" (1.867 m)   Wt 234 lb 9.6 oz (106.4 kg)   SpO2 100%   BMI 30.53 kg/m  Body mass index is 30.53 kg/m. Physical Exam: GEN: alert, well developed HEENT: clear conjunctiva, TM grey and translucent, nose with moderate inferior turbinate hypertrophy, pink nasal mucosa, clear rhinorrhea, + cobblestoning, no clear polyps on anterior exam HEART: regular rate and rhythm, no murmur LUNGS: clear to auscultation bilaterally, no coughing, unlabored respiration SKIN: no rashes or lesions  Spirometry:  Tracings reviewed. His effort: Good reproducible efforts. FVC: 3.69L FEV1: 2.24L, 79% predicted FEV1/FVC ratio: 61% Interpretation: Spirometry consistent with mild obstructive disease.  Please see scanned spirometry results for details.  Skin Testing:  Skin prick testing was placed, which includes aeroallergens/foods, histamine control, and saline control.  Verbal consent was obtained prior to placing test.  Patient tolerated procedure well.  Allergy testing results  were read and interpreted by myself, documented by clinical staff. Adequate positive and negative control.  Positive results to:  Results discussed with patient/family.  Airborne Adult Perc - 09/16/22 1125     Time Antigen Placed 1130    Allergen Manufacturer Lavella Hammock    Location Back    Number of Test 58    Panel 1 Select    1. Control-Buffer 50% Glycerol Negative    2. Control-Histamine 1 mg/ml 3+    3. Albumin saline Negative    4. [place] Negative    5. [place] Negative    6. [place]  Negative    7. [place] Blue Negative    9. Perennial Rye Negative    10. Sweet Vernal Negative    11. Timothy Negative    12. Cocklebur Negative    13. Burweed Marshelder Negative    14. Ragweed, short Negative    15. Ragweed, Giant Negative    16. Plantain,  English Negative    17. Lamb's Quarters Negative    18. Sheep Sorrell Negative    19. Rough Pigweed Negative    20. Marsh Elder, Rough Negative    21. Mugwort, Common Negative    22. Ash mix Negative    23. Birch mix Negative    24. Beech American Negative    25. Box, Elder Negative    26. Cedar, red Negative    27. Cottonwood, [place] Negative    28. Elm mix Negative    29. Hickory Negative    30. Maple mix Negative    31. Oak, [place] mix Negative    32. Pecan Pollen Negative    33. Pine mix Negative    34. Sycamore Eastern Negative    35. [place], Black Pollen Negative    36. Alternaria alternata Negative    37. Cladosporium Herbarum Negative    38. Aspergillus mix Negative    39. Penicillium mix Negative    40. Bipolaris sorokiniana (Helminthosporium) Negative    41. Drechslera spicifera (Curvularia) Negative    42. Mucor plumbeus Negative    43. Fusarium moniliforme Negative    44. Aureobasidium pullulans (pullulara) Negative    45. Rhizopus oryzae Negative    46. Botrytis cinera Negative    47. Epicoccum nigrum Negative    48. Phoma betae Negative    49. Candida Albicans Negative    50. Trichophyton mentagrophytes Negative    51. Mite, D Farinae  5,000 AU/ml Negative    52. Mite, D Pteronyssinus  5,000 AU/ml Negative    53. Cat Hair 10,000 BAU/ml Negative    54.  Dog Epithelia Negative    55. Mixed Feathers Negative    56. Horse Epithelia Negative    57. Cockroach, German Negative    58. Mouse Negative    59. Tobacco Leaf Negative              Assessment/Plan   Mild Persistent Asthma - Due to uncontrolled symptoms and mild obstruction on spirometry, will start maintenance inhaler. - MDI  technique discussed.   - Maintenance inhaler: start Flovent 46mg 2 puffs twice daily.   - Rescue inhaler: Albuterol 2 puffs via spacer every 4-6 hours as needed for respiratory symptoms of cough, shortness of breath, or wheezing Asthma control goals:  Full participation in all desired activities (may need albuterol before activity) Albuterol use two times or less a week on average (not counting use with activity) Cough interfering with sleep two times or less a month Oral steroids  no more than once a year No hospitalizations   Chronic Rhinosinusitis  Hx of Nasal Polyps - Due to uncontrolled symptoms and I am unable to review his prior skin test results, we did repeat aeroallergen skin testing today to identify triggers but his test was negative.  - Positive skin test 09/2022: none - Mix about 240cc (8oz) of saline (distilled water + saline packet) and 1 vial of budesonide 0.'5mg'$  (2cc) in [facility] sinus rinse bottle and do rinses once or twice daily.   - Use Azelastine 1-2 sprays each nostril twice daily as needed. Aim upward and outward. - We will refer you back to ENT for repeat evaluation in setting of uncontrolled symptoms with extensive sinus disease noted on MRI brain 2023, history of nasal polyps s/p polypectomy and negative aeroallergen skin testing today 09/2022.  If polyps have recurred and he does not wish to undergo surgery, can also consider biologic therapy.  Return in about 2 months (around 11/17/2022). Harlon Flor, MD  [facility]

## 2022-09-16 NOTE — Patient Instructions (Addendum)
Asthma: - Due to uncontrolled symptoms and mild obstruction on spirometry, will start maintenance inhaler. - MDI technique discussed.   - Maintenance inhaler: start Flovent 42mg 2 puffs twice daily.   - Rescue inhaler: Albuterol 2 puffs via spacer every 4-6 hours as needed for respiratory symptoms of cough, shortness of breath, or wheezing Asthma control goals:  Full participation in all desired activities (may need albuterol before activity) Albuterol use two times or less a week on average (not counting use with activity) Cough interfering with sleep two times or less a month Oral steroids no more than once a year No hospitalizations   Nasal polyposis Chronic Rhinitis - Due to uncontrolled symptoms and I am unable to review his prior skin test results, we did repeat aeroallergen skin testing today to identify triggers but his test was negative.  - Positive skin test 09/2022: none - Mix about 240cc (8oz) of saline (distilled water + saline packet) and 1 vial of budesonide 0.'5mg'$  (2cc) in Neilmed sinus rinse bottle and do rinses once or twice daily.   - Use Azelastine 1-2 sprays each nostril twice daily as needed. Aim upward and outward. - We will refer you back to ENT for repeat evaluation in setting of uncontrolled symptoms with extensive sinus disease noted on MRI brain 2023, history of nasal polyps s/p polypectomy and negative aeroallergen skin testing today 09/2022.   Return in about 2 months (around 11/17/2022).

## 2022-09-17 NOTE — Addendum Note (Signed)
Addended by: Felipa Emory on: 09/17/2022 07:57 AM   Modules accepted: Orders

## 2022-09-22 ENCOUNTER — Telehealth: Payer: Self-pay

## 2022-09-22 NOTE — Telephone Encounter (Signed)
-----   Message from Larose Kells, MD sent at 09/16/2022 12:26 PM EST ----- Hello, Could we please get this patient to ENT? He used to see Dr. Melida Quitter long time ago, around 2016; underwent sinus surgery for nasal polyps.

## 2022-09-22 NOTE — Telephone Encounter (Signed)
Patient is scheduled to see Dr Redmond Baseman on 11/18/2022 @ 9:50 AM. He is also scheduled with our office the same day & time so I am going to move him out a few weeks.   I called to discuss with patient, but had to leave a voicemail. Im going to send him a MyChart message also.

## 2022-09-30 ENCOUNTER — Encounter (HOSPITAL_BASED_OUTPATIENT_CLINIC_OR_DEPARTMENT_OTHER): Payer: Self-pay | Admitting: Emergency Medicine

## 2022-09-30 ENCOUNTER — Other Ambulatory Visit: Payer: Self-pay

## 2022-09-30 DIAGNOSIS — R059 Cough, unspecified: Secondary | ICD-10-CM | POA: Diagnosis present

## 2022-09-30 DIAGNOSIS — Z1152 Encounter for screening for COVID-19: Secondary | ICD-10-CM | POA: Diagnosis not present

## 2022-09-30 DIAGNOSIS — I129 Hypertensive chronic kidney disease with stage 1 through stage 4 chronic kidney disease, or unspecified chronic kidney disease: Secondary | ICD-10-CM | POA: Insufficient documentation

## 2022-09-30 DIAGNOSIS — Z85528 Personal history of other malignant neoplasm of kidney: Secondary | ICD-10-CM | POA: Diagnosis not present

## 2022-09-30 DIAGNOSIS — J4540 Moderate persistent asthma, uncomplicated: Secondary | ICD-10-CM | POA: Diagnosis not present

## 2022-09-30 DIAGNOSIS — N183 Chronic kidney disease, stage 3 unspecified: Secondary | ICD-10-CM | POA: Diagnosis not present

## 2022-09-30 DIAGNOSIS — J101 Influenza due to other identified influenza virus with other respiratory manifestations: Secondary | ICD-10-CM | POA: Diagnosis not present

## 2022-09-30 LAB — RESP PANEL BY RT-PCR (RSV, FLU A&B, COVID)  RVPGX2
Influenza A by PCR: POSITIVE — AB
Influenza B by PCR: NEGATIVE
Resp Syncytial Virus by PCR: NEGATIVE
SARS Coronavirus 2 by RT PCR: NEGATIVE

## 2022-09-30 NOTE — ED Triage Notes (Signed)
Pt is c/o sinus problems, runny nose, nasal congestion and cough  Sxs started a couple days ago and have been getting worse  Pt states he tried allegra, nasal rinse without relief

## 2022-10-01 ENCOUNTER — Emergency Department (HOSPITAL_BASED_OUTPATIENT_CLINIC_OR_DEPARTMENT_OTHER)
Admission: EM | Admit: 2022-10-01 | Discharge: 2022-10-01 | Disposition: A | Discharge status: Home / Self Care | Payer: Medicare HMO | Attending: Emergency Medicine | Admitting: Emergency Medicine

## 2022-10-01 DIAGNOSIS — J101 Influenza due to other identified influenza virus with other respiratory manifestations: Secondary | ICD-10-CM

## 2022-10-01 MED ORDER — FLUTICASONE PROPIONATE 50 MCG/ACT NA SUSP: 2.0000 | Freq: Every day | NASAL | 0 refills | Status: DC

## 2022-10-01 MED ORDER — ONDANSETRON 4 MG PO TBDP: 4.0000 mg | ORAL_TABLET | Freq: Three times a day (TID) | ORAL | 0 refills | Status: DC | PRN

## 2022-10-01 MED ORDER — BENZONATATE 100 MG PO CAPS: 100.0000 mg | ORAL_CAPSULE | Freq: Three times a day (TID) | ORAL | 0 refills | Status: DC

## 2022-10-01 NOTE — ED Notes (Signed)
ED Provider at bedside. 

## 2022-10-01 NOTE — ED Notes (Signed)
Rx x 3 given  Written and verbal inst to pt  Verbalized an understanding To home with family

## 2022-10-01 NOTE — ED Provider Notes (Signed)
Emergency Department Provider Note   I have reviewed the triage vital signs and the nursing notes.   HISTORY  Chief Complaint sinus problems    HPI Paul Shenker Sr. is a 76 y.o. male with past history of CKD and hypertension along with asthma presents to the emergency department for evaluation of runny nose with nasal congestion and cough.  He has had 2 days of symptoms with progressive worsening symptoms.  He has tried sinus medications over-the-counter with no relief.  No chest pain or shortness of breath.  Past Medical History:  Diagnosis Date   Abnormal EKG    Anemia    Atrial fibrillation (HCC)    CKD (chronic kidney disease), stage III ([place])    Essential hypertension 07/18/2009   Qualifier: Diagnosis of  By: [name], [name]     Fatigue    Hypertension    KIDNEY CANCER 07/18/2009   s/p right neprectomy   Moderate persistent asthma 07/18/2009        Nasal polyposis 12/16/2016   Onychomycosis 12/19/2014   Pain in lower limb 12/19/2014   Palpitations    Perennial allergic rhinitis with nonallergic component 06/17/2016   PVC (premature ventricular contraction)    Sinusitis, maxillary, chronic 11/09/2012    Review of Systems  Constitutional: No fever/chills. Positive fatigue.  Eyes: No visual changes. ENT: No sore throat. Positive congestion.  Cardiovascular: Denies chest pain. Respiratory: Denies shortness of breath. Positive cough.  Gastrointestinal: No abdominal pain. Genitourinary: Negative for dysuria. Musculoskeletal: Negative for back pain. Skin: Negative for rash. Neurological: Negative for headaches, focal weakness or numbness.  ____________________________________________   PHYSICAL EXAM:  VITAL SIGNS: ED Triage Vitals  Enc Vitals Group     BP 09/30/22 2301 (!) 158/74     Pulse Rate 09/30/22 2301 65     Resp 09/30/22 2301 20     Temp 09/30/22 2301 98.2 F (36.8 C)     Temp Source 09/30/22 2301 Oral     SpO2 09/30/22 2301 98 %     Weight  09/30/22 2258 232 lb (105.2 kg)     Height 09/30/22 2258 '6\' 2"'$  (1.88 m)   Constitutional: Alert and oriented. Well appearing and in no acute distress. Eyes: Conjunctivae are normal.  Head: Atraumatic. Nose: Positive congestion/rhinnorhea. Mouth/Throat: Mucous membranes are moist. Neck: No stridor.   Cardiovascular: Normal rate, regular rhythm. Good peripheral circulation. Grossly normal heart sounds.   Respiratory: Normal respiratory effort.  No retractions. Lungs CTAB. Gastrointestinal: Soft and nontender. No distention.  Musculoskeletal: No lower extremity tenderness nor edema. No gross deformities of extremities. Neurologic:  Normal speech and language. No gross focal neurologic deficits are appreciated.  Skin:  Skin is warm, dry and intact. No rash noted.  ____________________________________________   LABS (all labs ordered are listed, but only abnormal results are displayed)  Labs Reviewed  RESP PANEL BY RT-PCR (RSV, FLU A&B, COVID)  RVPGX2 - Abnormal; Notable for the following components:      Result Value   Influenza A by PCR POSITIVE (*)    All other components within normal limits    ____________________________________________   PROCEDURES  Procedure(s) performed:   Procedures  None  ____________________________________________   INITIAL IMPRESSION / ASSESSMENT AND PLAN / ED COURSE  Pertinent labs & imaging results that were available during my care of the patient were reviewed by me and considered in my medical decision making (see chart for details).   This patient is Presenting for Evaluation of URI, which does require  a range of treatment options, and is a complaint that involves a moderate risk of morbidity and mortality.  The Differential Diagnoses include COVID, Flu, RSV, CAP, etc.  Clinical Laboratory Tests Ordered, included Flu A positive. COVID negative.   Medical Decision Making: Summary:  Patient presents the emergency department with mild to  moderate flu symptoms for the past 2 days.  He looks very well.  No increased work of breathing or hypoxemia.  Lungs are clear.   Reevaluation with update and discussion with patient.  Discussed his flu positive results.  Plan for continued supportive care at home with close PCP follow-up.  Considered admission but no evidence of pneumonia clinically, hypoxemia, increased work of breathing.   Patient's presentation is most consistent with acute illness / injury with system symptoms.   Disposition: discharge  ____________________________________________  FINAL CLINICAL IMPRESSION(S) / ED DIAGNOSES  Final diagnoses:  Influenza A     NEW OUTPATIENT MEDICATIONS STARTED DURING THIS VISIT:  Discharge Medication List as of 10/01/2022  1:53 AM     START taking these medications   Details  benzonatate (TESSALON) 100 MG capsule Take 1 capsule (100 mg total) by mouth every 8 (eight) hours., Starting Thu 10/01/2022, Normal    ondansetron (ZOFRAN-ODT) 4 MG disintegrating tablet Take 1 tablet (4 mg total) by mouth every 8 (eight) hours as needed., Starting Thu 10/01/2022, Normal        Note:  This document was prepared using Dragon voice recognition software and may include unintentional dictation errors.  Nanda Quinton, MD, [facility]    Ludie Hudon, [facility], MD 10/06/22 [phone]

## 2022-10-06 ENCOUNTER — Other Ambulatory Visit: Payer: Self-pay | Admitting: Internal Medicine

## 2022-10-16 ENCOUNTER — Telehealth: Payer: Self-pay | Admitting: Internal Medicine

## 2022-10-16 MED ORDER — METOPROLOL TARTRATE 25 MG PO TABS: 25.0000 mg | ORAL_TABLET | Freq: Two times a day (BID) | ORAL | 0 refills | Status: DC

## 2022-10-16 NOTE — Telephone Encounter (Signed)
Pt's medication was sent to pt's pharmacy as requested. Confirmation received.

## 2022-10-16 NOTE — Telephone Encounter (Signed)
*  STAT* If patient is at the pharmacy, call can be transferred to refill team.   1. Which medications need to be refilled? (please list name of each medication and dose if known) metoprolol tartrate (LOPRESSOR) 25 MG tablet   2. Which pharmacy/location (including street and city if local pharmacy) is medication to be sent to?   [facility] - [place], [place] - [address]    3. Do they need a 30 day or 90 day supply? 90  Pt only wanted to see Dr Lovena Le, he made an appt for 12/18/22 at 8:15 am

## 2022-10-23 DIAGNOSIS — K5900 Constipation, unspecified: Secondary | ICD-10-CM | POA: Diagnosis not present

## 2022-11-03 DIAGNOSIS — G4733 Obstructive sleep apnea (adult) (pediatric): Secondary | ICD-10-CM | POA: Diagnosis not present

## 2022-11-05 DIAGNOSIS — M25519 Pain in unspecified shoulder: Secondary | ICD-10-CM | POA: Diagnosis not present

## 2022-11-05 DIAGNOSIS — N1831 Chronic kidney disease, stage 3a: Secondary | ICD-10-CM | POA: Diagnosis not present

## 2022-11-05 DIAGNOSIS — E1169 Type 2 diabetes mellitus with other specified complication: Secondary | ICD-10-CM | POA: Diagnosis not present

## 2022-11-05 DIAGNOSIS — R609 Edema, unspecified: Secondary | ICD-10-CM | POA: Diagnosis not present

## 2022-11-05 DIAGNOSIS — Z79620 Long term (current) use of immunosuppressive biologic: Secondary | ICD-10-CM | POA: Diagnosis not present

## 2022-11-05 DIAGNOSIS — D84821 Immunodeficiency due to drugs: Secondary | ICD-10-CM | POA: Diagnosis not present

## 2022-11-05 DIAGNOSIS — E221 Hyperprolactinemia: Secondary | ICD-10-CM | POA: Diagnosis not present

## 2022-11-05 DIAGNOSIS — D352 Benign neoplasm of pituitary gland: Secondary | ICD-10-CM | POA: Diagnosis not present

## 2022-11-05 DIAGNOSIS — Z6831 Body mass index (BMI) 31.0-31.9, adult: Secondary | ICD-10-CM | POA: Diagnosis not present

## 2022-11-18 ENCOUNTER — Telehealth: Payer: Self-pay | Admitting: Internal Medicine

## 2022-11-18 ENCOUNTER — Ambulatory Visit: Payer: Medicare HMO | Admitting: Internal Medicine

## 2022-11-18 DIAGNOSIS — J328 Other chronic sinusitis: Secondary | ICD-10-CM | POA: Diagnosis not present

## 2022-11-18 DIAGNOSIS — J338 Other polyp of sinus: Secondary | ICD-10-CM | POA: Diagnosis not present

## 2022-11-18 MED ORDER — METOPROLOL TARTRATE 25 MG PO TABS: 25.0000 mg | ORAL_TABLET | Freq: Two times a day (BID) | ORAL | 0 refills | Status: DC

## 2022-11-18 NOTE — Telephone Encounter (Signed)
Pt's medication was sent to pt's pharmacy as requested. Confirmation received.  °

## 2022-11-18 NOTE — Telephone Encounter (Signed)
*  STAT* If patient is at the pharmacy, call can be transferred to refill team.   1. Which medications need to be refilled? (please list name of each medication and dose if known) metoprolol tartrate (LOPRESSOR) 25 MG tablet   2. Which pharmacy/location (including street and city if local pharmacy) is medication to be sent to? [facility] - [place], [place] - [address]  3. Do they need a 30 day or 90 day supply? [place]

## 2022-11-25 ENCOUNTER — Other Ambulatory Visit: Payer: Self-pay

## 2022-11-25 ENCOUNTER — Ambulatory Visit: Payer: Medicare HMO | Admitting: Internal Medicine

## 2022-11-25 ENCOUNTER — Encounter: Payer: Self-pay | Admitting: Internal Medicine

## 2022-11-25 VITALS — BP 120/78 | HR 64 | Temp 98.3°F | Resp 20 | Wt 234.0 lb

## 2022-11-25 DIAGNOSIS — J4530 Mild persistent asthma, uncomplicated: Secondary | ICD-10-CM | POA: Diagnosis not present

## 2022-11-25 DIAGNOSIS — R053 Chronic cough: Secondary | ICD-10-CM | POA: Diagnosis not present

## 2022-11-25 DIAGNOSIS — J329 Chronic sinusitis, unspecified: Secondary | ICD-10-CM

## 2022-11-25 DIAGNOSIS — R0609 Other forms of dyspnea: Secondary | ICD-10-CM | POA: Diagnosis not present

## 2022-11-25 DIAGNOSIS — J339 Nasal polyp, unspecified: Secondary | ICD-10-CM | POA: Diagnosis not present

## 2022-11-25 DIAGNOSIS — R062 Wheezing: Secondary | ICD-10-CM

## 2022-11-25 MED ORDER — FLUTICASONE PROPIONATE HFA 44 MCG/ACT IN AERO: 2.0000 | INHALATION_SPRAY | Freq: Two times a day (BID) | RESPIRATORY_TRACT | 5 refills | Status: DC

## 2022-11-25 MED ORDER — ALBUTEROL SULFATE HFA 108 (90 BASE) MCG/ACT IN AERS: 2.0000 | INHALATION_SPRAY | Freq: Four times a day (QID) | RESPIRATORY_TRACT | 1 refills | Status: DC | PRN

## 2022-11-25 NOTE — Progress Notes (Signed)
FOLLOW UP Date of Service/Encounter:  11/25/22   Subjective:  Paul Mangiapane Sr. (DOB: Mar 19, 1946) is a 77 y.o. male who returns to the [facility] on 11/25/2022 for follow up for asthma and CRS with nasal polyps.    History obtained from: chart review and patient.  Last visit was with me 09/16/2022 and was started on Flovent for uncontrolled asthma and budesonide rinses for nasal polyps and CRS with referral to ENT.   Saw ENT and is doing budesonide rinses intermittently.  Denies having much issues with congestion or drainage.  Sense of smell is poor.  Seen by Dr. Redmond Baseman [facility] and had moderate polypoid changes in ethmoid sinuses but status wasn't too bad so trying to do budesonide rinses and if not improved considering biologic.   In terms of his asthma, he reports it is a lot better with starting Flovent.  He has not had much SOB or wheezing or coughing.  He does sometimes have SOB with activity requiring albuterol use about 1x/week or less. Rarely wheezes. No ER visits or oral prednisone.  He started the Flovent 2 puffs BID but is now using it every other day since he is doing better.   Past Medical History: Past Medical History:  Diagnosis Date   Abnormal EKG    Anemia    Atrial fibrillation (HCC)    CKD (chronic kidney disease), stage III ([place])    Essential hypertension 07/18/2009   Qualifier: Diagnosis of  By: [name], [name]     Fatigue    Hypertension    KIDNEY CANCER 07/18/2009   s/p right neprectomy   Moderate persistent asthma 07/18/2009        Nasal polyposis 12/16/2016   Onychomycosis 12/19/2014   Pain in lower limb 12/19/2014   Palpitations    Perennial allergic rhinitis with nonallergic component 06/17/2016   PVC (premature ventricular contraction)    Sinusitis, maxillary, chronic 11/09/2012    Objective:  BP 120/78 (BP Location: Left Arm, Patient Position: Sitting, Cuff Size: Normal)   Pulse 64   Temp 98.3 F (36.8 C) (Temporal)   Resp 20    Wt 234 lb (106.1 kg)   SpO2 98%   BMI 30.04 kg/m  Body mass index is 30.04 kg/m. Physical Exam: GEN: alert, well developed HEENT: clear conjunctiva, TM grey and translucent, nose with moderate inferior turbinate hypertrophy, pink nasal mucosa, clear rhinorrhea, + cobblestoning HEART: regular rate and rhythm, no murmur LUNGS: clear to auscultation bilaterally, no coughing, unlabored respiration SKIN: no rashes or lesions  Spirometry:  Tracings reviewed. His effort: Good reproducible efforts. FVC: 2.99L FEV1: 1.95L, 69% predicted FEV1/FVC ratio: 65% Interpretation: Spirometry consistent with mild obstructive disease.  Please see scanned spirometry results for details.  Assessment:   1. Mild persistent asthma without complication   2. Chronic rhinosinusitis   3. Nasal polyps     Plan/Recommendations:  Mild Persistent Asthma: - Spirometry with obstruction today, symptomatically better, discussed regular use of ICS. - Maintenance inhaler: restart Flovent 41mg 2 puffs once daily at least.  If symptoms worsen, increase to 2 puffs twice daily. - Rescue inhaler: Albuterol 2 puffs via spacer every 4-6 hours as needed for respiratory symptoms of cough, shortness of breath, or wheezing Asthma control goals:  Full participation in all desired activities (may need albuterol before activity) Albuterol use two times or less a week on average (not counting use with activity) Cough interfering with sleep two times or less a month Oral steroids no  more than once a year No hospitalizations   Chronic Rhinitis with Nasal polyposis - Positive skin test 09/2022: none - Continue budesonide rinses per ENT.  If no improvement, ENT would like to follow back with you.  Can consider Dupixent, injectable.      Return in about 3 months (around 02/23/2023).  Harlon Flor, MD [facility]

## 2022-11-25 NOTE — Patient Instructions (Addendum)
Asthma: - Maintenance inhaler: restart Flovent 73mg 2 puffs once daily.  If symptoms worsen, increase to 2 puffs twice daily. - Rescue inhaler: Albuterol 2 puffs via spacer every 4-6 hours as needed for respiratory symptoms of cough, shortness of breath, or wheezing Asthma control goals:  Full participation in all desired activities (may need albuterol before activity) Albuterol use two times or less a week on average (not counting use with activity) Cough interfering with sleep two times or less a month Oral steroids no more than once a year No hospitalizations   Nasal polyposis Chronic Rhinitis - Positive skin test 09/2022: none - Continue budesonide rinses per ENT.  If no improvement, ENT would like to follow back with you.  Can consider Dupixent, injectable.

## 2022-11-27 DIAGNOSIS — Z6830 Body mass index (BMI) 30.0-30.9, adult: Secondary | ICD-10-CM | POA: Diagnosis not present

## 2022-11-27 DIAGNOSIS — Z03818 Encounter for observation for suspected exposure to other biological agents ruled out: Secondary | ICD-10-CM | POA: Diagnosis not present

## 2022-11-27 DIAGNOSIS — J069 Acute upper respiratory infection, unspecified: Secondary | ICD-10-CM | POA: Diagnosis not present

## 2022-11-27 DIAGNOSIS — D84821 Immunodeficiency due to drugs: Secondary | ICD-10-CM | POA: Diagnosis not present

## 2022-11-27 DIAGNOSIS — R051 Acute cough: Secondary | ICD-10-CM | POA: Diagnosis not present

## 2022-12-03 ENCOUNTER — Other Ambulatory Visit: Payer: Self-pay

## 2022-12-03 ENCOUNTER — Telehealth: Payer: Self-pay | Admitting: Family Medicine

## 2022-12-03 MED ORDER — VENTOLIN HFA 108 (90 BASE) MCG/ACT IN AERS: 2.0000 | INHALATION_SPRAY | RESPIRATORY_TRACT | 1 refills | Status: AC | PRN

## 2022-12-03 NOTE — Telephone Encounter (Signed)
Patient called and stated the medication albuterol (VENTOLIN HFA) 108 (90 Base) MCG/ACT inhaler is $93 dollars and is too high and wanted to know if he can be switched to something less expensive. Call back number [phone].

## 2022-12-03 NOTE — Telephone Encounter (Signed)
Sent in brand to see if it was cheaper

## 2022-12-04 ENCOUNTER — Other Ambulatory Visit: Payer: Self-pay | Admitting: Internal Medicine

## 2022-12-04 MED ORDER — BUDESONIDE-FORMOTEROL FUMARATE 80-4.5 MCG/ACT IN AERO: 1.0000 | INHALATION_SPRAY | Freq: Two times a day (BID) | RESPIRATORY_TRACT | 5 refills | Status: DC

## 2022-12-04 NOTE — Progress Notes (Signed)
Order for symbicort placed.  Flovent not covered well.

## 2022-12-07 NOTE — Telephone Encounter (Signed)
Call and spoke to the pharmacy about inhalers cost, I was told pt insurance isn't covering a lot on the inhalers, the inhalers that care cover are all on the same tiers, so they will all be $90+, this was explained to pt and he verbalized understanding.

## 2022-12-08 ENCOUNTER — Other Ambulatory Visit: Payer: Self-pay | Admitting: Internal Medicine

## 2022-12-10 ENCOUNTER — Other Ambulatory Visit: Payer: Self-pay | Admitting: Internal Medicine

## 2022-12-17 ENCOUNTER — Encounter: Payer: Self-pay | Admitting: Internal Medicine

## 2022-12-17 ENCOUNTER — Ambulatory Visit: Payer: Medicare HMO | Attending: Internal Medicine | Admitting: Internal Medicine

## 2022-12-17 VITALS — BP 144/72 | HR 64 | Ht 73.0 in | Wt 230.8 lb

## 2022-12-17 DIAGNOSIS — I493 Ventricular premature depolarization: Secondary | ICD-10-CM

## 2022-12-17 DIAGNOSIS — I10 Essential (primary) hypertension: Secondary | ICD-10-CM | POA: Diagnosis not present

## 2022-12-17 DIAGNOSIS — I4892 Unspecified atrial flutter: Secondary | ICD-10-CM | POA: Diagnosis not present

## 2022-12-17 NOTE — Patient Instructions (Signed)

## 2022-12-17 NOTE — Progress Notes (Signed)
HPI [name] returns today for followup of atrial flutter and HTN,s/p flutter ablation. He was noted to have an irregular heart beat due to PVC's. He has cut back his caffeine intake. He denies chest pain, sob, or palpitations. No edema. He has not had any sustained arrhythmias.  He still exercises and works part time. Allergies  Allergen Reactions   Altace [Ramipril] Other (See Comments)    Patient is unaware of allergy   Bromocriptine     Other reaction(s): ankle  edema   Cozaar [Losartan Potassium] Other (See Comments)    Patient is unaware of allergy     Current Outpatient Medications  Medication Sig Dispense Refill   albuterol (VENTOLIN HFA) 108 (90 Base) MCG/ACT inhaler Inhale 2 puffs into the lungs every 6 (six) hours as needed for wheezing or shortness of breath. 8 g 1   amLODipine (NORVASC) 5 MG tablet Take 5 mg by mouth every evening.      azelastine (ASTELIN) 0.1 % nasal spray Place 1 spray into both nostrils 2 (two) times daily. Use in each nostril as directed 30 mL 5   budesonide (PULMICORT) 0.5 MG/2ML nebulizer solution Add entire contents of one vial of budesonide to a saline nasal spray bottle, then add saline solution and mix together. Use twice a day as needed for nasal congestion and nasal polyps. 60 mL 3   budesonide-formoterol (SYMBICORT) 80-4.5 MCG/ACT inhaler Inhale 1 puff into the lungs in the morning and at bedtime. 1 each 5   dapagliflozin propanediol (FARXIGA) 10 MG TABS tablet Take 1 tablet by mouth daily.     ENBREL SURECLICK 50 MG/ML injection Inject into the skin.     fluticasone (FLOVENT HFA) 44 MCG/ACT inhaler Inhale 2 puffs into the lungs in the morning and at bedtime. 1 each 5   hydrALAZINE (APRESOLINE) 100 MG tablet Take 100 mg by mouth 3 (three) times daily.     hydrochlorothiazide (HYDRODIURIL) 12.5 MG tablet Take 12.5 mg by mouth every Monday, Wednesday, and Friday.     hydroxychloroquine (PLAQUENIL) 200 MG tablet Take 1 tablet by mouth 2  (two) times daily.     levalbuterol (XOPENEX HFA) 45 MCG/ACT inhaler INHALE 2 PUFFS BY MOUTH EVERY 6 HOURS AS NEEDED FOR WHEEZING     levothyroxine (SYNTHROID) 25 MCG tablet Take 25 mcg by mouth every morning.     metoprolol tartrate (LOPRESSOR) 25 MG tablet Take 1 tablet by mouth twice daily 60 tablet 0   sildenafil (VIAGRA) 100 MG tablet Take 50-100 mg by mouth daily as needed for erectile dysfunction.     VENTOLIN HFA 108 (90 Base) MCG/ACT inhaler Inhale 2 puffs into the lungs every 4 (four) hours as needed for wheezing or shortness of breath. 1 each 1   No current facility-administered medications for this visit.     Past Medical History:  Diagnosis Date   Abnormal EKG    Anemia    Atrial fibrillation (HCC)    CKD (chronic kidney disease), stage III ([place])    Essential hypertension 07/18/2009   Qualifier: Diagnosis of  By: Paul Ramp RN, Paul Roach     Fatigue    Hypertension    KIDNEY CANCER 07/18/2009   s/p right neprectomy   Moderate persistent asthma 07/18/2009        Nasal polyposis 12/16/2016   Onychomycosis 12/19/2014   Pain in lower limb 12/19/2014   Palpitations    Perennial allergic rhinitis with nonallergic component 06/17/2016   PVC (premature ventricular  contraction)    Sinusitis, maxillary, chronic 11/09/2012    ROS:   All systems reviewed and negative except as noted in the HPI.   Past Surgical History:  Procedure Laterality Date   A-FLUTTER ABLATION N/A 05/08/2019   Procedure: A-FLUTTER ABLATION;  Surgeon: Paul Lance, MD;  Location: [facility];  Service: Cardiovascular;  Laterality: N/A;   ESOPHAGOGASTRODUODENOSCOPY (EGD) WITH PROPOFOL Left 10/19/2019   Procedure: ESOPHAGOGASTRODUODENOSCOPY (EGD) WITH PROPOFOL;  Surgeon: Paul Silence, MD;  Location: [facility];  Service: Endoscopy;  Laterality: Left;   HEMORRHOID SURGERY     right ankle surgery     right hip replacement     right kidney removed  2000   for renal ca   SINOSCOPY       Family  History  Problem Relation Age of Onset   Hypertension Mother    Diabetes Mother    Heart attack Mother    Unexplained death Father    Colon cancer Sister    Diabetes Brother    Alcoholism Brother    Colon cancer Sister    Healthy Son    Healthy Son    Healthy Son    Lung disease Neg Hx    Allergic rhinitis Neg Hx    Angioedema Neg Hx    Asthma Neg Hx    Eczema Neg Hx    Immunodeficiency Neg Hx    Urticaria Neg Hx      Social History   Socioeconomic History   Marital status: Married    Spouse name: Not on file   Number of children: 3   Years of education: Not on file   Highest education level: Not on file  Occupational History   Occupation: Retired    Comment: Patent attorney  Tobacco Use   Smoking status: Never   Smokeless tobacco: Never  Scientific laboratory technician Use: Never used  Substance and Sexual Activity   Alcohol use: Not Currently    Comment: 1-2 drinks/month   Drug use: No   Sexual activity: Not on file  Other Topics Concern   Not on file  Social History Narrative   Originally from [place]. Moved to [place] in 1996. Has also lived in [place]. Has traveled to [place], [place], [place], [place], [place], [place], [place], [place], [place], [place], [place], [place], [place], [place], [place], [place], [place], [place], & [place]. 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 No pets currently. No bird, mold or hot tub exposure. Enjoys exercising regularly, fishing & traveling.    Social Determinants of Health   Financial Resource Strain: Not on file  Food Insecurity: Not on file  Transportation Needs: Not on file  Physical Activity: Not on file  Stress: Not on file  Social Connections: Not on file  Intimate Partner Violence: Not on file     BP (!) 144/72   Pulse 64   Ht '6\' 1"'$  (1.854 m)   Wt 230 lb 12.8 oz (104.7 kg)   SpO2 100%   BMI 30.45 kg/m   Physical Exam:  Well appearing NAD HEENT:  Unremarkable Neck:  No JVD, no thyromegally Lymphatics:  No adenopathy Back:  No CVA tenderness Lungs:  Clear HEART:  Regular rate rhythm, no murmurs, no rubs, no clicks Abd:  soft, positive bowel sounds, no organomegally, no rebound, no guarding Ext:  2 plus pulses, no edema, no cyanosis, no clubbing Skin:  No  rashes no nodules Neuro:  CN II through XII intact, motor grossly intact  EKG - nsr with PVC's  DEVICE  Normal device function.  See PaceArt for details.   Assess/Plan: 1. PVC's - he is asymptomatic. I encouraged him to  undergo watchful waiting. If he develops any symptoms we would consider AA drug therapy. 2. Atrial flutter - no evidence of recurrent atrial arrhythmias. We will follow. 3. HTN - his bp is mostly well controlled.   Carleene Overlie Alphonse Asbridge,MD

## 2022-12-22 DIAGNOSIS — E669 Obesity, unspecified: Secondary | ICD-10-CM | POA: Diagnosis not present

## 2022-12-22 DIAGNOSIS — R5382 Chronic fatigue, unspecified: Secondary | ICD-10-CM | POA: Diagnosis not present

## 2022-12-22 DIAGNOSIS — N183 Chronic kidney disease, stage 3 unspecified: Secondary | ICD-10-CM | POA: Diagnosis not present

## 2022-12-22 DIAGNOSIS — M0579 Rheumatoid arthritis with rheumatoid factor of multiple sites without organ or systems involvement: Secondary | ICD-10-CM | POA: Diagnosis not present

## 2022-12-22 DIAGNOSIS — R21 Rash and other nonspecific skin eruption: Secondary | ICD-10-CM | POA: Diagnosis not present

## 2022-12-22 DIAGNOSIS — Z6830 Body mass index (BMI) 30.0-30.9, adult: Secondary | ICD-10-CM | POA: Diagnosis not present

## 2022-12-28 NOTE — Telephone Encounter (Signed)
Pt was made aware of the cost and change.

## 2023-01-08 ENCOUNTER — Other Ambulatory Visit: Payer: Self-pay | Admitting: Internal Medicine

## 2023-01-18 ENCOUNTER — Encounter: Payer: Self-pay | Admitting: *Deleted

## 2023-01-28 ENCOUNTER — Telehealth: Payer: Self-pay | Admitting: Internal Medicine

## 2023-01-28 ENCOUNTER — Telehealth: Payer: Self-pay

## 2023-01-28 NOTE — Telephone Encounter (Signed)
Pharmacy states flovent is not covered in generic or brand, they prefer Arnuity.

## 2023-01-28 NOTE — Telephone Encounter (Signed)
Flovent is too expensive for patient. It appears that Arnuity Ellipta 50,100 and 200 is on formulary. Please advise.

## 2023-01-28 NOTE — Telephone Encounter (Signed)
Is Arnuity a good alternative for this patient to be switched to in place of the Flovent? Please advise

## 2023-01-28 NOTE — Telephone Encounter (Signed)
Patient called stating the Flovent is now over $100.00 and he is requesting a change in his medication.  [facility] [address] [place], [place] - [address]

## 2023-01-29 ENCOUNTER — Other Ambulatory Visit: Payer: Self-pay | Admitting: Internal Medicine

## 2023-01-29 MED ORDER — ARNUITY ELLIPTA 100 MCG/ACT IN AEPB: 1.0000 | INHALATION_SPRAY | Freq: Every day | RESPIRATORY_TRACT | 5 refills | Status: DC

## 2023-01-29 NOTE — Progress Notes (Signed)
Flovent is not covered. Sending Arnuity Ellipta.

## 2023-01-29 NOTE — Telephone Encounter (Signed)
Dr Allena Katz sent in Arnuity

## 2023-02-15 DIAGNOSIS — N183 Chronic kidney disease, stage 3 unspecified: Secondary | ICD-10-CM | POA: Diagnosis not present

## 2023-02-15 DIAGNOSIS — I10 Essential (primary) hypertension: Secondary | ICD-10-CM | POA: Diagnosis not present

## 2023-02-23 DIAGNOSIS — Z85528 Personal history of other malignant neoplasm of kidney: Secondary | ICD-10-CM | POA: Diagnosis not present

## 2023-02-23 DIAGNOSIS — N390 Urinary tract infection, site not specified: Secondary | ICD-10-CM | POA: Diagnosis not present

## 2023-02-23 DIAGNOSIS — I10 Essential (primary) hypertension: Secondary | ICD-10-CM | POA: Diagnosis not present

## 2023-02-23 DIAGNOSIS — N183 Chronic kidney disease, stage 3 unspecified: Secondary | ICD-10-CM | POA: Diagnosis not present

## 2023-02-23 DIAGNOSIS — I482 Chronic atrial fibrillation, unspecified: Secondary | ICD-10-CM | POA: Diagnosis not present

## 2023-02-24 ENCOUNTER — Ambulatory Visit: Payer: Medicare HMO | Admitting: Internal Medicine

## 2023-02-24 ENCOUNTER — Encounter: Payer: Self-pay | Admitting: Internal Medicine

## 2023-02-24 VITALS — BP 126/60 | HR 61 | Temp 98.1°F | Resp 16

## 2023-02-24 DIAGNOSIS — J4530 Mild persistent asthma, uncomplicated: Secondary | ICD-10-CM

## 2023-02-24 DIAGNOSIS — J339 Nasal polyp, unspecified: Secondary | ICD-10-CM | POA: Diagnosis not present

## 2023-02-24 DIAGNOSIS — J329 Chronic sinusitis, unspecified: Secondary | ICD-10-CM

## 2023-02-24 MED ORDER — ARNUITY ELLIPTA 100 MCG/ACT IN AEPB: 1.0000 | INHALATION_SPRAY | Freq: Every day | RESPIRATORY_TRACT | 5 refills | Status: DC

## 2023-02-24 MED ORDER — ALBUTEROL SULFATE HFA 108 (90 BASE) MCG/ACT IN AERS: 2.0000 | INHALATION_SPRAY | Freq: Four times a day (QID) | RESPIRATORY_TRACT | 1 refills | Status: DC | PRN

## 2023-02-24 MED ORDER — FLUTICASONE PROPIONATE 50 MCG/ACT NA SUSP: 2.0000 | Freq: Every day | NASAL | 5 refills | Status: AC

## 2023-02-24 MED ORDER — AZELASTINE HCL 0.1 % NA SOLN: 1.0000 | Freq: Two times a day (BID) | NASAL | 5 refills | Status: AC

## 2023-02-24 NOTE — Patient Instructions (Addendum)
Natividad Brood Sr. Return in about 6 months (around 08/27/2023).   Mild Persistent Asthma: - Maintenance inhaler: continue Anuity Ellipta 1 puff once daily.  If symptoms worsen, increase to 2 puffs daily. - Rescue inhaler: Albuterol 2 puffs via spacer every 4-6 hours as needed for respiratory symptoms of cough, shortness of breath, or wheezing Asthma control goals:  Full participation in all desired activities (may need albuterol before activity) Albuterol use two times or less a week on average (not counting use with activity) Cough interfering with sleep two times or less a month Oral steroids no more than once a year No hospitalizations   Nasal polyposis Chronic Rhinitis - Positive skin test 09/2022: none - Use nasal saline rinses before nose sprays such as with Neilmed Sinus Rinse.  Use distilled water.   - Once you complete budesonide rinses, use Flonase 2 sprays each nostril daily. Aim upward and outward. - Use Azelastine 1-2 sprays each nostril twice daily as needed. Aim upward and outward. - Follow up with ENT if your symptoms worsen.

## 2023-02-24 NOTE — Progress Notes (Signed)
FOLLOW UP Date of Service/Encounter:  02/24/23   Subjective:  Paul Streich Sr. (DOB: Jul 20, 1946) is a 77 y.o. male who [facility] for follow up for asthma and CRS with nasal polyps.    History obtained from: chart review and patient. Last visit was with me on 11/25/2022 and had seen ENT with moderate polypoid changes in ethmoid sinus so discussed doing budesonide rinses. Also Flovent was no longer covered for his asthma so we switched to Arnuity Ellipta.   Asthma: Asthma Control Test: ACT Total Score: 25.   Doing well since last visit.  He is very active- goes to the gym and works part time.  Prior to start Arnuity, he was having shortness of breath when he was using the treadmill; however, since starting it, he has noticed improvement and no longer has any trouble with aerobic activity.  Rarely needs Albuterol, can't recall last time of use.  No ER visits/oral prednisone/nighttime awakenings since last visit.   CRS with nasal polyps: Does have some runny nose and drainage intermittent.  Does budesonide rinses PRN but forgets many times.  Sense of smell remains poor.  Has not seen ENT as he has done okay overall and has no interest in biologic or surgery.   Past Medical History: Past Medical History:  Diagnosis Date   Abnormal EKG    Anemia    Atrial fibrillation (HCC)    CKD (chronic kidney disease), stage III (HCC)    Essential hypertension 07/18/2009   Qualifier: Diagnosis of  By: Yetta Barre RN, [name]     Fatigue    Hypertension    KIDNEY CANCER 07/18/2009   s/p right neprectomy   Moderate persistent asthma 07/18/2009        Nasal polyposis 12/16/2016   Onychomycosis 12/19/2014   Pain in lower limb 12/19/2014   Palpitations    Perennial allergic rhinitis with nonallergic component 06/17/2016   PVC (premature ventricular contraction)    Sinusitis, maxillary, chronic 11/09/2012    Objective:  BP 126/60   Pulse 61   Temp 98.1 F (36.7  C) (Temporal)   Resp 16   SpO2 96%  There is no height or weight on file to calculate BMI. Physical Exam: GEN: alert, well developed HEENT: clear conjunctiva, TM grey and translucent, nose with moderate inferior turbinate hypertrophy, pink nasal mucosa, clear rhinorrhea, + cobblestoning HEART: regular rate and rhythm, no murmur LUNGS: clear to auscultation bilaterally, no coughing, unlabored respiration SKIN: no rashes or lesions  Spirometry:  Tracings reviewed. His effort: Good reproducible efforts. FVC: 2.76L; post  FEV1: 1.72L, 61% predicted FEV1/FVC ratio: 62% Interpretation: Spirometry consistent with mixed obstructive and restrictive disease.  Please see scanned spirometry results for details.   Assessment:   1. Mild persistent asthma without complication   2. Chronic rhinosinusitis   3. Nasal polyps     Plan/Recommendations:  Mild Persistent Asthma: - Spirometry today with reversible obstruction, doing well symptomatically though.  Discussed proper technique of DPI since we switched it at last visit due to insurance issues and also corrected his technique.   - Maintenance inhaler: continue Anuity Ellipta 1 puff once daily.  If symptoms worsen, increase to 2 puffs daily. - Rescue inhaler: Albuterol 2 puffs via spacer every 4-6 hours as needed for respiratory symptoms of cough, shortness of breath, or wheezing Asthma control goals:  Full participation in all desired activities (may need albuterol before activity) Albuterol use two times or less a week  on average (not counting use with activity) Cough interfering with sleep two times or less a month Oral steroids no more than once a year No hospitalizations   Nasal polyposis Chronic Rhinitis - Not well controlled  - Positive skin test 09/2022: none - Use nasal saline rinses before nose sprays such as with Neilmed Sinus Rinse.  Use distilled water.   - Once you complete budesonide rinses, use Flonase 2 sprays  each nostril daily. Aim upward and outward. - Use Azelastine 1-2 sprays each nostril twice daily as needed. Aim upward and outward. - Follow up with ENT if your symptoms worsen.      Return in about 6 months (around 08/27/2023).  Alesia Morin, MD [facility]

## 2023-02-25 DIAGNOSIS — I10 Essential (primary) hypertension: Secondary | ICD-10-CM | POA: Diagnosis not present

## 2023-02-25 DIAGNOSIS — E559 Vitamin D deficiency, unspecified: Secondary | ICD-10-CM | POA: Diagnosis not present

## 2023-02-25 DIAGNOSIS — E039 Hypothyroidism, unspecified: Secondary | ICD-10-CM | POA: Diagnosis not present

## 2023-02-25 DIAGNOSIS — E1169 Type 2 diabetes mellitus with other specified complication: Secondary | ICD-10-CM | POA: Diagnosis not present

## 2023-02-25 DIAGNOSIS — E7800 Pure hypercholesterolemia, unspecified: Secondary | ICD-10-CM | POA: Diagnosis not present

## 2023-02-25 DIAGNOSIS — Z125 Encounter for screening for malignant neoplasm of prostate: Secondary | ICD-10-CM | POA: Diagnosis not present

## 2023-03-04 DIAGNOSIS — E7800 Pure hypercholesterolemia, unspecified: Secondary | ICD-10-CM | POA: Diagnosis not present

## 2023-03-04 DIAGNOSIS — Z6830 Body mass index (BMI) 30.0-30.9, adult: Secondary | ICD-10-CM | POA: Diagnosis not present

## 2023-03-04 DIAGNOSIS — M069 Rheumatoid arthritis, unspecified: Secondary | ICD-10-CM | POA: Diagnosis not present

## 2023-03-04 DIAGNOSIS — E1122 Type 2 diabetes mellitus with diabetic chronic kidney disease: Secondary | ICD-10-CM | POA: Diagnosis not present

## 2023-03-04 DIAGNOSIS — D696 Thrombocytopenia, unspecified: Secondary | ICD-10-CM | POA: Diagnosis not present

## 2023-03-04 DIAGNOSIS — I10 Essential (primary) hypertension: Secondary | ICD-10-CM | POA: Diagnosis not present

## 2023-03-04 DIAGNOSIS — Z0000 Encounter for general adult medical examination without abnormal findings: Secondary | ICD-10-CM | POA: Diagnosis not present

## 2023-03-04 DIAGNOSIS — E559 Vitamin D deficiency, unspecified: Secondary | ICD-10-CM | POA: Diagnosis not present

## 2023-03-04 DIAGNOSIS — R972 Elevated prostate specific antigen [PSA]: Secondary | ICD-10-CM | POA: Diagnosis not present

## 2023-05-04 DIAGNOSIS — E039 Hypothyroidism, unspecified: Secondary | ICD-10-CM | POA: Diagnosis not present

## 2023-06-22 DIAGNOSIS — N183 Chronic kidney disease, stage 3 unspecified: Secondary | ICD-10-CM | POA: Diagnosis not present

## 2023-06-22 DIAGNOSIS — R5382 Chronic fatigue, unspecified: Secondary | ICD-10-CM | POA: Diagnosis not present

## 2023-06-22 DIAGNOSIS — M0579 Rheumatoid arthritis with rheumatoid factor of multiple sites without organ or systems involvement: Secondary | ICD-10-CM | POA: Diagnosis not present

## 2023-06-22 DIAGNOSIS — M2550 Pain in unspecified joint: Secondary | ICD-10-CM | POA: Diagnosis not present

## 2023-06-22 DIAGNOSIS — E669 Obesity, unspecified: Secondary | ICD-10-CM | POA: Diagnosis not present

## 2023-06-22 DIAGNOSIS — Z6830 Body mass index (BMI) 30.0-30.9, adult: Secondary | ICD-10-CM | POA: Diagnosis not present

## 2023-06-22 DIAGNOSIS — R21 Rash and other nonspecific skin eruption: Secondary | ICD-10-CM | POA: Diagnosis not present

## 2023-06-28 DIAGNOSIS — M25551 Pain in right hip: Secondary | ICD-10-CM | POA: Diagnosis not present

## 2023-07-01 ENCOUNTER — Telehealth: Payer: Self-pay | Admitting: Internal Medicine

## 2023-07-01 NOTE — Telephone Encounter (Signed)
Note pt was called cand he just wanted to know the name of his nebulizer medicine.

## 2023-07-01 NOTE — Telephone Encounter (Signed)
Patient would like a refill on medication (nebulizer solution) Pharmacy is [facility] [address] [place], [place] - [address]. Informed patient that he has 3 refills and to call the pharmacy for fill. Patient would like to speak with a nurse regarding medication please call patient @ [phone].

## 2023-07-02 ENCOUNTER — Other Ambulatory Visit: Payer: Self-pay | Admitting: Orthopaedic Surgery

## 2023-07-02 DIAGNOSIS — M545 Low back pain, unspecified: Secondary | ICD-10-CM

## 2023-07-10 ENCOUNTER — Ambulatory Visit
Admission: RE | Admit: 2023-07-10 | Discharge: 2023-07-10 | Disposition: A | Discharge status: Home / Self Care | Payer: Medicare HMO | Source: Ambulatory Visit | Attending: Orthopaedic Surgery | Admitting: Orthopaedic Surgery

## 2023-07-10 DIAGNOSIS — M545 Low back pain, unspecified: Secondary | ICD-10-CM

## 2023-07-10 DIAGNOSIS — M47816 Spondylosis without myelopathy or radiculopathy, lumbar region: Secondary | ICD-10-CM | POA: Diagnosis not present

## 2023-07-23 DIAGNOSIS — M545 Low back pain, unspecified: Secondary | ICD-10-CM | POA: Diagnosis not present

## 2023-07-26 DIAGNOSIS — M0579 Rheumatoid arthritis with rheumatoid factor of multiple sites without organ or systems involvement: Secondary | ICD-10-CM | POA: Diagnosis not present

## 2023-08-17 DIAGNOSIS — I10 Essential (primary) hypertension: Secondary | ICD-10-CM | POA: Diagnosis not present

## 2023-08-17 DIAGNOSIS — N183 Chronic kidney disease, stage 3 unspecified: Secondary | ICD-10-CM | POA: Diagnosis not present

## 2023-08-20 DIAGNOSIS — M545 Low back pain, unspecified: Secondary | ICD-10-CM | POA: Diagnosis not present

## 2023-08-24 DIAGNOSIS — I10 Essential (primary) hypertension: Secondary | ICD-10-CM | POA: Diagnosis not present

## 2023-08-24 DIAGNOSIS — I4891 Unspecified atrial fibrillation: Secondary | ICD-10-CM | POA: Diagnosis not present

## 2023-08-24 DIAGNOSIS — E119 Type 2 diabetes mellitus without complications: Secondary | ICD-10-CM | POA: Diagnosis not present

## 2023-08-24 DIAGNOSIS — E663 Overweight: Secondary | ICD-10-CM | POA: Diagnosis not present

## 2023-08-24 DIAGNOSIS — N390 Urinary tract infection, site not specified: Secondary | ICD-10-CM | POA: Diagnosis not present

## 2023-08-24 DIAGNOSIS — N183 Chronic kidney disease, stage 3 unspecified: Secondary | ICD-10-CM | POA: Diagnosis not present

## 2023-08-25 DIAGNOSIS — R972 Elevated prostate specific antigen [PSA]: Secondary | ICD-10-CM | POA: Diagnosis not present

## 2023-08-25 DIAGNOSIS — R350 Frequency of micturition: Secondary | ICD-10-CM | POA: Diagnosis not present

## 2023-08-25 DIAGNOSIS — N401 Enlarged prostate with lower urinary tract symptoms: Secondary | ICD-10-CM | POA: Diagnosis not present

## 2023-08-27 ENCOUNTER — Ambulatory Visit: Payer: Medicare HMO | Admitting: Internal Medicine

## 2023-08-27 DIAGNOSIS — M47816 Spondylosis without myelopathy or radiculopathy, lumbar region: Secondary | ICD-10-CM | POA: Diagnosis not present

## 2023-09-01 DIAGNOSIS — E039 Hypothyroidism, unspecified: Secondary | ICD-10-CM | POA: Diagnosis not present

## 2023-09-01 DIAGNOSIS — D352 Benign neoplasm of pituitary gland: Secondary | ICD-10-CM | POA: Diagnosis not present

## 2023-09-07 DIAGNOSIS — M47816 Spondylosis without myelopathy or radiculopathy, lumbar region: Secondary | ICD-10-CM | POA: Diagnosis not present

## 2023-09-08 DIAGNOSIS — E1169 Type 2 diabetes mellitus with other specified complication: Secondary | ICD-10-CM | POA: Diagnosis not present

## 2023-09-24 DIAGNOSIS — M545 Low back pain, unspecified: Secondary | ICD-10-CM | POA: Diagnosis not present

## 2023-09-28 ENCOUNTER — Emergency Department (HOSPITAL_BASED_OUTPATIENT_CLINIC_OR_DEPARTMENT_OTHER): Payer: Medicare HMO

## 2023-09-28 ENCOUNTER — Encounter (HOSPITAL_BASED_OUTPATIENT_CLINIC_OR_DEPARTMENT_OTHER): Payer: Self-pay | Admitting: Emergency Medicine

## 2023-09-28 ENCOUNTER — Other Ambulatory Visit: Payer: Self-pay

## 2023-09-28 ENCOUNTER — Emergency Department (HOSPITAL_BASED_OUTPATIENT_CLINIC_OR_DEPARTMENT_OTHER)
Admission: EM | Admit: 2023-09-28 | Discharge: 2023-09-28 | Disposition: A | Discharge status: Home / Self Care | Payer: Medicare HMO | Attending: Emergency Medicine | Admitting: Emergency Medicine

## 2023-09-28 DIAGNOSIS — J4521 Mild intermittent asthma with (acute) exacerbation: Secondary | ICD-10-CM | POA: Insufficient documentation

## 2023-09-28 DIAGNOSIS — I10 Essential (primary) hypertension: Secondary | ICD-10-CM | POA: Diagnosis not present

## 2023-09-28 DIAGNOSIS — Z1152 Encounter for screening for COVID-19: Secondary | ICD-10-CM | POA: Diagnosis not present

## 2023-09-28 DIAGNOSIS — Z79899 Other long term (current) drug therapy: Secondary | ICD-10-CM | POA: Insufficient documentation

## 2023-09-28 DIAGNOSIS — J069 Acute upper respiratory infection, unspecified: Secondary | ICD-10-CM | POA: Insufficient documentation

## 2023-09-28 DIAGNOSIS — R059 Cough, unspecified: Secondary | ICD-10-CM | POA: Diagnosis not present

## 2023-09-28 LAB — RESP PANEL BY RT-PCR (RSV, FLU A&B, COVID)  RVPGX2
Influenza A by PCR: NEGATIVE
Influenza B by PCR: NEGATIVE
Resp Syncytial Virus by PCR: NEGATIVE
SARS Coronavirus 2 by RT PCR: NEGATIVE

## 2023-09-28 MED ORDER — AZITHROMYCIN 250 MG PO TABS: 250.0000 mg | ORAL_TABLET | Freq: Every day | ORAL | 0 refills | Status: DC

## 2023-09-28 MED ORDER — ALBUTEROL SULFATE HFA 108 (90 BASE) MCG/ACT IN AERS
2.0000 | INHALATION_SPRAY | Freq: Once | RESPIRATORY_TRACT | Status: AC
  Administered 2023-09-28: 2 via RESPIRATORY_TRACT
  Filled 2023-09-28: qty 6.7

## 2023-09-28 NOTE — Discharge Instructions (Signed)
I have prescribed Z-Pak for you.  Please use albuterol every 4 hours as needed for cough and congestion  See your doctor for follow-up  Return to ER if you have worse trouble breathing or cough or shortness of breath

## 2023-09-28 NOTE — ED Triage Notes (Signed)
URI x 2 days , nasal congestion , productive cough , chills .  Denies chest pain or shortness of breath

## 2023-09-28 NOTE — ED Notes (Signed)
Pt endorses a productive cough and chills, with virals symptoms.

## 2023-09-28 NOTE — ED Provider Notes (Signed)
EMERGENCY DEPARTMENT AT [facility] Provider Note   CSN: [id number] Arrival date & time: 09/28/23  1623     History  Chief Complaint  Patient presents with   URI    Paul Brood Sr. is a 77 y.o. male history of hypertension, here presenting with cough and congestion.  Patient has been having cough and congestion for the last 2 to 3 days.  Patient has history of asthma and states that he felt himself wheezing.  He does not have an albuterol MDI currently. Patient denies any sick contacts.  Patient has people over for Christmas so wants to make sure that he is well enough to meet people.  Denies any fevers  The history is provided by the patient.       Home Medications Prior to Admission medications   Medication Sig Start Date End Date Taking? Authorizing Provider  albuterol (VENTOLIN HFA) 108 (90 Base) MCG/ACT inhaler Inhale 2 puffs into the lungs every 6 (six) hours as needed for wheezing or shortness of breath. 02/24/23   Birder Robson, MD  amLODipine (NORVASC) 5 MG tablet Take 5 mg by mouth every evening.  11/05/18   [provider]  azelastine (ASTELIN) 0.1 % nasal spray Place 1 spray into both nostrils 2 (two) times daily. Use in each nostril as directed 02/24/23   Birder Robson, MD  budesonide (PULMICORT) 0.5 MG/2ML nebulizer solution Add entire contents of one vial of budesonide to a saline nasal spray bottle, then add saline solution and mix together. Use twice a day as needed for nasal congestion and nasal polyps. 09/16/22   Birder Robson, MD  dapagliflozin propanediol (FARXIGA) 10 MG TABS tablet Take 1 tablet by mouth daily.    [provider]  ENBREL SURECLICK 50 MG/ML injection Inject into the skin. 08/22/20   [provider]  fluticasone (FLONASE) 50 MCG/ACT nasal spray Place 2 sprays into both nostrils daily. 02/24/23   Birder Robson, MD  Fluticasone Furoate (ARNUITY ELLIPTA) 100 MCG/ACT AEPB Inhale 1 puff into the lungs  daily. 02/24/23   Birder Robson, MD  hydrALAZINE (APRESOLINE) 100 MG tablet Take 100 mg by mouth 3 (three) times daily.    [provider]  hydrochlorothiazide (HYDRODIURIL) 12.5 MG tablet Take 12.5 mg by mouth every Monday, Wednesday, and Friday.    [provider]  hydroxychloroquine (PLAQUENIL) 200 MG tablet Take 1 tablet by mouth 2 (two) times daily.    [provider]  levothyroxine (SYNTHROID) 25 MCG tablet Take 25 mcg by mouth every morning. 09/01/22   [provider]  metoprolol tartrate (LOPRESSOR) 25 MG tablet Take 1 tablet by mouth twice daily 01/08/23   Marinus Maw, MD  sildenafil (VIAGRA) 100 MG tablet Take 50-100 mg by mouth daily as needed for erectile dysfunction.    [provider]  VENTOLIN HFA 108 (90 Base) MCG/ACT inhaler Inhale 2 puffs into the lungs every 4 (four) hours as needed for wheezing or shortness of breath. 12/03/22   Birder Robson, MD      Allergies    Altace [ramipril], Bromocriptine, and Cozaar [losartan potassium]    Review of Systems   Review of Systems  Respiratory:  Positive for cough.   All other systems reviewed and are negative.   Physical Exam Updated Vital Signs BP (!) 161/90 (BP Location: Right Arm)   Pulse 63   Temp 97.8 F (36.6 C)   Resp 18   Wt 104.8 kg  SpO2 98%   BMI 30.48 kg/m  Physical Exam Vitals and nursing note reviewed.  Constitutional:      Appearance: Normal appearance.  HENT:     Head: Normocephalic.     Nose: Nose normal.     Mouth/Throat:     Mouth: Mucous membranes are moist.  Eyes:     Extraocular Movements: Extraocular movements intact.     Pupils: Pupils are equal, round, and reactive to light.  Cardiovascular:     Rate and Rhythm: Normal rate and regular rhythm.     Pulses: Normal pulses.     Heart sounds: Normal heart sounds.  Pulmonary:     Comments: Mild diffuse wheezing and no retractions Abdominal:     General: Abdomen is flat.     Palpations:  Abdomen is soft.  Musculoskeletal:        General: Normal range of motion.     Cervical back: Normal range of motion and neck supple.  Skin:    General: Skin is warm.     Capillary Refill: Capillary refill takes less than 2 seconds.  Neurological:     General: No focal deficit present.     Mental Status: He is alert.  Psychiatric:        Mood and Affect: Mood normal.        Behavior: Behavior normal.     ED Results / Procedures / Treatments   Labs (all labs ordered are listed, but only abnormal results are displayed) Labs Reviewed  RESP PANEL BY RT-PCR (RSV, FLU A&B, COVID)  RVPGX2    EKG None  Radiology No results found.  Procedures Procedures    Medications Ordered in ED Medications  albuterol (VENTOLIN HFA) 108 (90 Base) MCG/ACT inhaler 2 puff (2 puffs Inhalation Given 09/28/23 1652)    ED Course/ Medical Decision Making/ A&P                                 Medical Decision Making Paul Cannada Sr. is a 77 y.o. male here presenting with cough and wheezing.  Likely mild asthma exacerbation.  Also consider COVID or flu or RSV or pneumonia.  Plan to get chest x-ray and swab for respiratory panel and give albuterol and reassess  6:04 PM COVID and flu and RSV negative.  Chest x-ray clear.  Patient has less wheezing after albuterol.  Stable for discharge.  Likely mild asthma exacerbation.  Patient states that he usually benefits from Z-Pak  Problems Addressed: Mild intermittent asthma with acute exacerbation: acute illness or injury Upper respiratory tract infection, unspecified type: acute illness or injury  Amount and/or Complexity of Data Reviewed Radiology: ordered.  Risk Prescription drug management.    Final Clinical Impression(s) / ED Diagnoses Final diagnoses:  None    Rx / DC Orders ED Discharge Orders     None         Charlynne Pander, MD 09/28/23 [phone]

## 2023-09-28 NOTE — ED Notes (Signed)
Patient transported to X-ray 

## 2023-09-30 DIAGNOSIS — J069 Acute upper respiratory infection, unspecified: Secondary | ICD-10-CM | POA: Diagnosis not present

## 2023-10-04 DIAGNOSIS — I4891 Unspecified atrial fibrillation: Secondary | ICD-10-CM | POA: Diagnosis not present

## 2023-10-04 DIAGNOSIS — I10 Essential (primary) hypertension: Secondary | ICD-10-CM | POA: Diagnosis not present

## 2023-10-04 DIAGNOSIS — N1832 Chronic kidney disease, stage 3b: Secondary | ICD-10-CM | POA: Diagnosis not present

## 2023-10-04 DIAGNOSIS — Z23 Encounter for immunization: Secondary | ICD-10-CM | POA: Diagnosis not present

## 2023-10-04 DIAGNOSIS — Z6831 Body mass index (BMI) 31.0-31.9, adult: Secondary | ICD-10-CM | POA: Diagnosis not present

## 2023-10-04 DIAGNOSIS — Z8639 Personal history of other endocrine, nutritional and metabolic disease: Secondary | ICD-10-CM | POA: Diagnosis not present

## 2023-10-04 DIAGNOSIS — R059 Cough, unspecified: Secondary | ICD-10-CM | POA: Diagnosis not present

## 2023-10-11 NOTE — Progress Notes (Signed)
 [address] [place] [address] Dept: [phone]  FOLLOW UP NOTE  Patient ID: Paul Kerkhoff Sr., male    DOB: 1946-01-30  Age: 78 y.o. MRN: [id number] Date of Office Visit: 10/12/2023  Assessment  Chief Complaint: Follow-up and Medication Refill (Doing well, refill inhalers no change in medical hystory)  HPI Paul Minus Sr. is a 78 year old male who presents to the clinic for follow-up visit.  He was last seen in this clinic on 02/24/2023 by Dr. Tobie for evaluation of asthma, chronic rhinitis, and nasal polyposis.  In the interim, he visited the emergency department on 09/28/2023 for evaluation of cough.  He had a negative chest x-ray at that visit as well as negative testing for COVID, influenza, and RSV.  At today's visit, he reports his asthma has been moderately well-controlled with intermittent shortness of breath that is not worsened with activity.  He reports wheezing 1 night last week, otherwise wheezing has not been problematic for him.  He denies cough with activity or rest.  He continues Arnuity 100-1 puff once a day continues to use albuterol  almost daily, out of habit rather than need for relief of asthma symptoms.  He does have a history of atrial flutter with ablation, PVC, and PAC and albuterol  will be changed from albuterol  to levalbuterol  at today's visit.  Chronic rhinitis is reported as moderately well-controlled with occasional symptoms including nasal congestion, sneezing, and postnasal drainage with some throat clearing.  He infrequently uses Flonase  and is not currently taking an antihistamine or using a nasal saline rinse.  His last environmental allergy  skin testing was on 09/17/2022 and was negative to the adult environmental panel.  Nasal polyposis is reported as moderately well-controlled with occasional nasal congestion and intermittent interference with smell and taste.  He continues Flonase  very infrequently.  His current medications are listed in the  chart.    CLINICAL DATA:  Cough. EXAM: CHEST - 2 VIEW COMPARISON:  Chest radiograph dated 03/10/2019. FINDINGS: The heart size and mediastinal contours are within normal limits. Both lungs are clear. The visualized skeletal structures are unremarkable. IMPRESSION: No active cardiopulmonary disease   Electronically Signed   By: Vanetta Chou M.D.   On: 09/28/2023 17:54       Drug Allergies:  Allergies  Allergen Reactions   Altace [Ramipril] Other (See Comments)    Patient is unaware of allergy    Bromocriptine     Other reaction(s): ankle  edema   Cozaar [Losartan Potassium] Other (See Comments)    Patient is unaware of allergy     Physical Exam: BP 128/86   Pulse (!) 59   Temp 99.5 F (37.5 C) (Temporal)   Resp 18   Ht 6' 2 (1.88 m)   Wt 229 lb 4.8 oz (104 kg)   SpO2 96%   BMI 29.44 kg/m    Physical Exam Vitals reviewed.  Constitutional:      Appearance: Normal appearance.  HENT:     Head: Normocephalic and atraumatic.     Right Ear: Tympanic membrane normal.     Left Ear: Tympanic membrane normal.     Nose:     Comments: Bilateral nares slightly erythematous with thin clear nasal drainage noted pharynx normal.  Ears normal.  Eyes normal.    Mouth/Throat:     Pharynx: Oropharynx is clear.  Eyes:     Conjunctiva/sclera: Conjunctivae normal.  Cardiovascular:     Rate and Rhythm: Rhythm irregular.     Heart sounds: No murmur  heard. Pulmonary:     Effort: Pulmonary effort is normal.     Breath sounds: Normal breath sounds.     Comments: Lungs clear to auscultation Musculoskeletal:        General: Normal range of motion.     Cervical back: Normal range of motion and neck supple.  Skin:    General: Skin is warm and dry.  Neurological:     Mental Status: He is alert and oriented to person, place, and time.  Psychiatric:        Mood and Affect: Mood normal.        Behavior: Behavior normal.        Thought Content: Thought content normal.         Judgment: Judgment normal.     Diagnostics: FVC 2.78 which is 74% of predicted value, FEV1 1.71 which is 61% of predicted value.  Spirometry indicates normal ventilatory function. This is consistent with previous spirometry readings.  Assessment and Plan: 1. Mild persistent asthma without complication   2. Chronic rhinitis   3. Nasal polyps     Meds ordered this encounter  Medications   albuterol  (VENTOLIN  HFA) 108 (90 Base) MCG/ACT inhaler    Sig: Inhale 2 puffs into the lungs every 6 (six) hours as needed for wheezing or shortness of breath.    Dispense:  8 g    Refill:  1   Fluticasone  Furoate (ARNUITY ELLIPTA ) 100 MCG/ACT AEPB    Sig: Inhale 1 puff into the lungs daily.    Dispense:  30 each    Refill:  5    Patient Instructions  Asthma Continue Arnuity 100-1 puff once a day to prevent cough or wheeze Begin Xopenex  2 puffs once every 4-6 hours as needed for cough or wheeze. This will replace albuterol  for relief of asthma symptoms You may use Xopenex  2 puffs 5 to 15 minutes before activity to decrease cough or wheeze  Chronic rhinitis Continue Flonase  2 sprays in each nostril once a day if needed for stuffy nose Continue azelastine  2 sprays in each nostril up to twice a day as needed for runny nose or itch in the nose Consider saline nasal rinses as needed for nasal symptoms. Use this before any medicated nasal sprays for best result  Nasal polyposis Continue Flonase  2 sprays in each nostril once a day to control nasal polyposis Follow-up with your ENT specialist if your symptoms worsen  Call the clinic if this treatment plan is not working well for you.  Follow up in 6 months or sooner if needed.  Return in about 6 months (around 04/10/2024), or if symptoms worsen or fail to improve.    Thank you for the opportunity to care for this patient.  Please do not hesitate to contact me with questions.  Arlean Mutter, FNP [facility]

## 2023-10-11 NOTE — Patient Instructions (Signed)
 Asthma Continue Arnuity 100-1 puff once a day to prevent cough or wheeze Begin Xopenex  2 puffs once every 4-6 hours as needed for cough or wheeze. This will replace albuterol  for relief of asthma symptoms You may use Xopenex  2 puffs 5 to 15 minutes before activity to decrease cough or wheeze  Chronic rhinitis Continue Flonase  2 sprays in each nostril once a day if needed for stuffy nose Continue azelastine  2 sprays in each nostril up to twice a day as needed for runny nose or itch in the nose Consider saline nasal rinses as needed for nasal symptoms. Use this before any medicated nasal sprays for best result  Nasal polyposis Continue Flonase  2 sprays in each nostril once a day to control nasal polyposis Follow-up with your ENT specialist if your symptoms worsen  Call the clinic if this treatment plan is not working well for you.  Follow up in 6 months or sooner if needed.

## 2023-10-12 ENCOUNTER — Ambulatory Visit: Payer: Medicare HMO | Admitting: Family Medicine

## 2023-10-12 ENCOUNTER — Telehealth: Payer: Self-pay

## 2023-10-12 ENCOUNTER — Encounter: Payer: Self-pay | Admitting: Family Medicine

## 2023-10-12 ENCOUNTER — Other Ambulatory Visit: Payer: Self-pay | Admitting: Family Medicine

## 2023-10-12 ENCOUNTER — Other Ambulatory Visit: Payer: Self-pay

## 2023-10-12 VITALS — BP 128/86 | HR 59 | Temp 99.5°F | Resp 18 | Ht 74.0 in | Wt 229.3 lb

## 2023-10-12 DIAGNOSIS — J339 Nasal polyp, unspecified: Secondary | ICD-10-CM | POA: Diagnosis not present

## 2023-10-12 DIAGNOSIS — J4530 Mild persistent asthma, uncomplicated: Secondary | ICD-10-CM

## 2023-10-12 DIAGNOSIS — J310 Chronic rhinitis: Secondary | ICD-10-CM

## 2023-10-12 MED ORDER — ALBUTEROL SULFATE HFA 108 (90 BASE) MCG/ACT IN AERS: 2.0000 | INHALATION_SPRAY | Freq: Four times a day (QID) | RESPIRATORY_TRACT | 1 refills | Status: DC | PRN

## 2023-10-12 MED ORDER — ARNUITY ELLIPTA 100 MCG/ACT IN AEPB: 1.0000 | INHALATION_SPRAY | Freq: Every day | RESPIRATORY_TRACT | 5 refills | Status: DC

## 2023-10-12 NOTE — Telephone Encounter (Signed)
 Spoke with patient and he will call insurance company to see what his deductible is and to see what other inhalers are covered by his insurance.

## 2023-10-12 NOTE — Telephone Encounter (Signed)
 Patient calling stating he can't afford 200.00 for his arnuity ellipta  100 mcg. Last year it was 45.00,but he thinks he remembers that he probably had a deductable to meet. Paul Roach will see what other inhaler we can send in that isn't as costly. Told patient I will get back with him. The xopenex  hfa is only 19.00.

## 2023-10-12 NOTE — Telephone Encounter (Signed)
 It looks like Arnuity is the one that is covered. Can you please have him call the insurance and find out about the deductible? Also have him ask what they cover while he has the insurance company on the phone. Looks like flovent , qvar , and pulmicort  are all not covered. Thank you

## 2023-10-13 MED ORDER — ARNUITY ELLIPTA 100 MCG/ACT IN AEPB: INHALATION_SPRAY | RESPIRATORY_TRACT | 5 refills | Status: DC

## 2023-10-13 NOTE — Telephone Encounter (Signed)
 Patient calling to let us know that he spoke with [employer] and he has a 2,000.00 deduct able, so I sent in his arnuity ellipta 100 mcg to [facility] [place]. It will cost the patient 77.00 he can't afford 3 month supply right now.

## 2023-10-26 DIAGNOSIS — H524 Presbyopia: Secondary | ICD-10-CM | POA: Diagnosis not present

## 2023-11-02 DIAGNOSIS — G4733 Obstructive sleep apnea (adult) (pediatric): Secondary | ICD-10-CM | POA: Diagnosis not present

## 2023-11-08 ENCOUNTER — Other Ambulatory Visit: Payer: Self-pay

## 2023-11-08 MED ORDER — BUDESONIDE 0.5 MG/2ML IN SUSP: RESPIRATORY_TRACT | 3 refills | Status: DC

## 2023-11-22 DIAGNOSIS — J45998 Other asthma: Secondary | ICD-10-CM | POA: Diagnosis not present

## 2023-11-22 DIAGNOSIS — R059 Cough, unspecified: Secondary | ICD-10-CM | POA: Diagnosis not present

## 2023-12-07 DIAGNOSIS — Z03818 Encounter for observation for suspected exposure to other biological agents ruled out: Secondary | ICD-10-CM | POA: Diagnosis not present

## 2023-12-07 DIAGNOSIS — J988 Other specified respiratory disorders: Secondary | ICD-10-CM | POA: Diagnosis not present

## 2023-12-07 DIAGNOSIS — R051 Acute cough: Secondary | ICD-10-CM | POA: Diagnosis not present

## 2023-12-07 DIAGNOSIS — J4541 Moderate persistent asthma with (acute) exacerbation: Secondary | ICD-10-CM | POA: Diagnosis not present

## 2023-12-29 DIAGNOSIS — Z6830 Body mass index (BMI) 30.0-30.9, adult: Secondary | ICD-10-CM | POA: Diagnosis not present

## 2023-12-29 DIAGNOSIS — M0579 Rheumatoid arthritis with rheumatoid factor of multiple sites without organ or systems involvement: Secondary | ICD-10-CM | POA: Diagnosis not present

## 2023-12-29 DIAGNOSIS — R5382 Chronic fatigue, unspecified: Secondary | ICD-10-CM | POA: Diagnosis not present

## 2023-12-29 DIAGNOSIS — R21 Rash and other nonspecific skin eruption: Secondary | ICD-10-CM | POA: Diagnosis not present

## 2023-12-29 DIAGNOSIS — E669 Obesity, unspecified: Secondary | ICD-10-CM | POA: Diagnosis not present

## 2024-01-05 ENCOUNTER — Other Ambulatory Visit: Payer: Self-pay | Admitting: Internal Medicine

## 2024-01-18 ENCOUNTER — Ambulatory Visit: Payer: Medicare HMO | Admitting: Internal Medicine

## 2024-01-18 DIAGNOSIS — Z800 Family history of malignant neoplasm of digestive organs: Secondary | ICD-10-CM | POA: Diagnosis not present

## 2024-01-18 DIAGNOSIS — Z860101 Personal history of adenomatous and serrated colon polyps: Secondary | ICD-10-CM | POA: Diagnosis not present

## 2024-01-31 ENCOUNTER — Other Ambulatory Visit: Payer: Self-pay | Admitting: Internal Medicine

## 2024-02-02 DIAGNOSIS — G4730 Sleep apnea, unspecified: Secondary | ICD-10-CM | POA: Diagnosis not present

## 2024-02-02 DIAGNOSIS — E663 Overweight: Secondary | ICD-10-CM | POA: Diagnosis not present

## 2024-02-02 DIAGNOSIS — Z1330 Encounter for screening examination for mental health and behavioral disorders, unspecified: Secondary | ICD-10-CM | POA: Diagnosis not present

## 2024-02-02 DIAGNOSIS — E785 Hyperlipidemia, unspecified: Secondary | ICD-10-CM | POA: Diagnosis not present

## 2024-02-02 DIAGNOSIS — J45909 Unspecified asthma, uncomplicated: Secondary | ICD-10-CM | POA: Diagnosis not present

## 2024-02-02 DIAGNOSIS — I4821 Permanent atrial fibrillation: Secondary | ICD-10-CM | POA: Diagnosis not present

## 2024-02-02 DIAGNOSIS — I10 Essential (primary) hypertension: Secondary | ICD-10-CM | POA: Diagnosis not present

## 2024-02-02 DIAGNOSIS — E039 Hypothyroidism, unspecified: Secondary | ICD-10-CM | POA: Diagnosis not present

## 2024-02-02 DIAGNOSIS — Z889 Allergy status to unspecified drugs, medicaments and biological substances status: Secondary | ICD-10-CM | POA: Diagnosis not present

## 2024-02-11 NOTE — Progress Notes (Unsigned)
  Electrophysiology Office Note:   Date:  02/11/2024  ID:  Paul Reagin Sr., DOB Feb 08, 1946, MRN [id number]  Primary Cardiologist: Gaylyn Keas, MD Electrophysiologist: Manya Sells, MD  {Click to update primary MD,subspecialty MD or APP then REFRESH:1}    History of Present Illness:   Paul Yellen Sr. is a 78 y.o. male with h/o HTN, PVCs, and AFL s/p ablation seen today for routine electrophysiology followup.   Since last being seen in our clinic the patient reports doing ***.  he denies chest pain, palpitations, dyspnea, PND, orthopnea, nausea, vomiting, dizziness, syncope, edema, weight gain, or early satiety.   Review of systems complete and found to be negative unless listed in HPI.   EP Information / Studies Reviewed:    EKG is ordered today. Personal review as below.       Arrhythmia/Device History No specialty comments available.   Physical Exam:   VS:  There were no vitals taken for this visit.   Wt Readings from Last 3 Encounters:  10/12/23 229 lb 4.8 oz (104 kg)  09/28/23 231 lb (104.8 kg)  12/17/22 230 lb 12.8 oz (104.7 kg)     GEN: No acute distress NECK: No JVD; No carotid bruits CARDIAC: {EPRHYTHM:28826}, no murmurs, rubs, gallops RESPIRATORY:  Clear to auscultation without rales, wheezing or rhonchi  ABDOMEN: Soft, non-tender, non-distended EXTREMITIES:  {EDEMA LEVEL:28147::"No"} edema; No deformity   ASSESSMENT AND PLAN:    PVCs EKG today shows ***  AFL No evidence of recurrence Not on OAC with only flutter identified thus far on my review  HTN Stable on current regimen    {Click here to Review PMH, Prob List, Meds, Allergies, SHx, FHx  :1}   Follow up with {JOACZ:66063} {EPFOLLOW KZ:60109}  Signed, Paul Galla, PA-C

## 2024-02-14 ENCOUNTER — Ambulatory Visit: Attending: Student | Admitting: Student

## 2024-02-14 ENCOUNTER — Encounter: Payer: Self-pay | Admitting: Student

## 2024-02-14 VITALS — BP 130/80 | HR 66 | Ht 74.0 in | Wt 227.5 lb

## 2024-02-14 DIAGNOSIS — I4892 Unspecified atrial flutter: Secondary | ICD-10-CM

## 2024-02-14 DIAGNOSIS — I493 Ventricular premature depolarization: Secondary | ICD-10-CM

## 2024-02-14 DIAGNOSIS — I10 Essential (primary) hypertension: Secondary | ICD-10-CM | POA: Diagnosis not present

## 2024-02-14 NOTE — Patient Instructions (Signed)
 Medication Instructions:  Your physician recommends that you continue on your current medications as directed. Please refer to the Current Medication list given to you today.  *If you need a refill on your cardiac medications before your next appointment, please call your pharmacy*  Lab Work: None ordered If you have labs (blood work) drawn today and your tests are completely normal, you will receive your results only by: MyChart Message (if you have MyChart) OR A paper copy in the mail If you have any lab test that is abnormal or we need to change your treatment, we will call you to review the results.  Follow-Up: At [facility], you and your health needs are our priority.  As part of our continuing mission to provide you with exceptional heart care, our providers are all part of one team.  This team includes your primary Cardiologist (physician) and Advanced Practice Providers or APPs (Physician Assistants and Nurse Practitioners) who all work together to provide you with the care you need, when you need it.  Your next appointment:   1 year(s)  Provider:   Bambi Lever "Michaelle Adolphus, PA-C

## 2024-02-15 DIAGNOSIS — N183 Chronic kidney disease, stage 3 unspecified: Secondary | ICD-10-CM | POA: Diagnosis not present

## 2024-02-15 DIAGNOSIS — I10 Essential (primary) hypertension: Secondary | ICD-10-CM | POA: Diagnosis not present

## 2024-02-16 DIAGNOSIS — R972 Elevated prostate specific antigen [PSA]: Secondary | ICD-10-CM | POA: Diagnosis not present

## 2024-02-18 DIAGNOSIS — D175 Benign lipomatous neoplasm of intra-abdominal organs: Secondary | ICD-10-CM | POA: Diagnosis not present

## 2024-02-18 DIAGNOSIS — K5730 Diverticulosis of large intestine without perforation or abscess without bleeding: Secondary | ICD-10-CM | POA: Diagnosis not present

## 2024-02-18 DIAGNOSIS — Z09 Encounter for follow-up examination after completed treatment for conditions other than malignant neoplasm: Secondary | ICD-10-CM | POA: Diagnosis not present

## 2024-02-18 DIAGNOSIS — Z860100 Personal history of colon polyps, unspecified: Secondary | ICD-10-CM | POA: Diagnosis not present

## 2024-02-18 DIAGNOSIS — D124 Benign neoplasm of descending colon: Secondary | ICD-10-CM | POA: Diagnosis not present

## 2024-02-18 DIAGNOSIS — D123 Benign neoplasm of transverse colon: Secondary | ICD-10-CM | POA: Diagnosis not present

## 2024-02-22 DIAGNOSIS — N390 Urinary tract infection, site not specified: Secondary | ICD-10-CM | POA: Diagnosis not present

## 2024-02-22 DIAGNOSIS — D124 Benign neoplasm of descending colon: Secondary | ICD-10-CM | POA: Diagnosis not present

## 2024-02-22 DIAGNOSIS — I10 Essential (primary) hypertension: Secondary | ICD-10-CM | POA: Diagnosis not present

## 2024-02-22 DIAGNOSIS — D123 Benign neoplasm of transverse colon: Secondary | ICD-10-CM | POA: Diagnosis not present

## 2024-02-22 DIAGNOSIS — I4891 Unspecified atrial fibrillation: Secondary | ICD-10-CM | POA: Diagnosis not present

## 2024-02-22 DIAGNOSIS — N183 Chronic kidney disease, stage 3 unspecified: Secondary | ICD-10-CM | POA: Diagnosis not present

## 2024-02-22 DIAGNOSIS — E119 Type 2 diabetes mellitus without complications: Secondary | ICD-10-CM | POA: Diagnosis not present

## 2024-02-23 DIAGNOSIS — N401 Enlarged prostate with lower urinary tract symptoms: Secondary | ICD-10-CM | POA: Diagnosis not present

## 2024-02-23 DIAGNOSIS — R350 Frequency of micturition: Secondary | ICD-10-CM | POA: Diagnosis not present

## 2024-02-25 DIAGNOSIS — Z6829 Body mass index (BMI) 29.0-29.9, adult: Secondary | ICD-10-CM | POA: Diagnosis not present

## 2024-02-25 DIAGNOSIS — J45998 Other asthma: Secondary | ICD-10-CM | POA: Diagnosis not present

## 2024-03-01 ENCOUNTER — Other Ambulatory Visit: Payer: Self-pay | Admitting: Internal Medicine

## 2024-03-07 DIAGNOSIS — E559 Vitamin D deficiency, unspecified: Secondary | ICD-10-CM | POA: Diagnosis not present

## 2024-03-07 DIAGNOSIS — R972 Elevated prostate specific antigen [PSA]: Secondary | ICD-10-CM | POA: Diagnosis not present

## 2024-03-07 DIAGNOSIS — D352 Benign neoplasm of pituitary gland: Secondary | ICD-10-CM | POA: Diagnosis not present

## 2024-03-07 DIAGNOSIS — E039 Hypothyroidism, unspecified: Secondary | ICD-10-CM | POA: Diagnosis not present

## 2024-03-07 DIAGNOSIS — I10 Essential (primary) hypertension: Secondary | ICD-10-CM | POA: Diagnosis not present

## 2024-03-07 DIAGNOSIS — D696 Thrombocytopenia, unspecified: Secondary | ICD-10-CM | POA: Diagnosis not present

## 2024-03-07 DIAGNOSIS — E1169 Type 2 diabetes mellitus with other specified complication: Secondary | ICD-10-CM | POA: Diagnosis not present

## 2024-03-07 DIAGNOSIS — E7800 Pure hypercholesterolemia, unspecified: Secondary | ICD-10-CM | POA: Diagnosis not present

## 2024-03-10 ENCOUNTER — Ambulatory Visit: Admitting: Internal Medicine

## 2024-03-14 DIAGNOSIS — M069 Rheumatoid arthritis, unspecified: Secondary | ICD-10-CM | POA: Diagnosis not present

## 2024-03-14 DIAGNOSIS — Z0000 Encounter for general adult medical examination without abnormal findings: Secondary | ICD-10-CM | POA: Diagnosis not present

## 2024-03-14 DIAGNOSIS — Z6830 Body mass index (BMI) 30.0-30.9, adult: Secondary | ICD-10-CM | POA: Diagnosis not present

## 2024-03-20 NOTE — Patient Instructions (Incomplete)
 Asthma Continue Arnuity 100-1 puff twice a day to prevent cough or wheeze for the next 2 months Continue Xopenex  2 puffs once every 4-6 hours as needed for cough or wheeze. This will replace albuterol  for relief of asthma symptoms You may use Xopenex  2 puffs 5 to 15 minutes before activity to decrease cough or wheeze  Chronic rhinitis Continue Flonase  2 sprays in each nostril once a day if needed for stuffy nose Continue azelastine  2 sprays in each nostril up to twice a day as needed for runny nose or itch in the nose Consider saline nasal rinses as needed for nasal symptoms. Use this before any medicated nasal sprays for best result  Nasal polyposis Continue Flonase  2 sprays in each nostril once a day to control nasal polyposis Follow-up with your ENT specialist if your symptoms worsen  Call the clinic if this treatment plan is not working well for you.  Follow up in 2 months or sooner if needed.

## 2024-03-20 NOTE — Progress Notes (Unsigned)
   [address] [place] [address] Dept: [phone]  FOLLOW UP NOTE  Patient ID: Paul Demarais Sr., male    DOB: 1945/11/13  Age: 78 y.o. MRN: [id number] Date of Office Visit: 03/21/2024  Assessment  Chief Complaint: No chief complaint on file.  HPI Paul Tremont Sr. is a 78 year old male who presents to the clinic for follow-up visit.  He was last seen in this clinic 10/12/2023 by Marinus Sic, FNP, for evaluation of asthma, chronic rhinitis, and nasal polyposis.  His last environmental allergy  skin testing was negative to the adult panel on 09/16/2022.  He continues to follow-up with Dr. Estill Hemming, ENT specialist, with his last appointment on 11/18/2022.  Discussed the use of AI scribe software for clinical note transcription with the patient, who gave verbal consent to proceed.  History of Present Illness      Drug Allergies:  Allergies  Allergen Reactions   Altace [Ramipril] Other (See Comments)    Patient is unaware of allergy    Bromocriptine     Other reaction(s): ankle  edema   Cozaar [Losartan Potassium] Other (See Comments)    Patient is unaware of allergy     Physical Exam: There were no vitals taken for this visit.   Physical Exam  Diagnostics:    Assessment and Plan: No diagnosis found.  No orders of the defined types were placed in this encounter.   There are no Patient Instructions on file for this visit.  No follow-ups on file.    Thank you for the opportunity to care for this patient.  Please do not hesitate to contact me with questions.  Marinus Sic, FNP [facility]

## 2024-03-21 ENCOUNTER — Ambulatory Visit (INDEPENDENT_AMBULATORY_CARE_PROVIDER_SITE_OTHER): Admitting: Family Medicine

## 2024-03-21 ENCOUNTER — Encounter: Payer: Self-pay | Admitting: Family Medicine

## 2024-03-21 ENCOUNTER — Other Ambulatory Visit: Payer: Self-pay

## 2024-03-21 VITALS — BP 128/86 | HR 64 | Temp 97.9°F | Resp 18 | Wt 227.6 lb

## 2024-03-21 DIAGNOSIS — J310 Chronic rhinitis: Secondary | ICD-10-CM

## 2024-03-21 DIAGNOSIS — J339 Nasal polyp, unspecified: Secondary | ICD-10-CM

## 2024-03-21 DIAGNOSIS — J45909 Unspecified asthma, uncomplicated: Secondary | ICD-10-CM

## 2024-03-21 DIAGNOSIS — J45998 Other asthma: Secondary | ICD-10-CM

## 2024-03-21 MED ORDER — LEVALBUTEROL TARTRATE 45 MCG/ACT IN AERO
4.0000 | INHALATION_SPRAY | Freq: Once | RESPIRATORY_TRACT | Status: AC
  Administered 2024-03-21: 4 via RESPIRATORY_TRACT

## 2024-03-22 ENCOUNTER — Ambulatory Visit: Admitting: Internal Medicine

## 2024-03-24 ENCOUNTER — Telehealth: Payer: Self-pay | Admitting: Family Medicine

## 2024-03-24 NOTE — Telephone Encounter (Signed)
 Pt request a call back about a refill on a medication, he does not know the name of it but says it should have been sent the day of his visit.

## 2024-03-24 NOTE — Telephone Encounter (Signed)
 Ok to use albuterol  if patient is not having any symptoms and xopenex  is not affordable even with insurance. Thank you

## 2024-03-24 NOTE — Telephone Encounter (Signed)
 Spoke with patient has he says he has Arnuity at home. The only med we sent on Tuesday was the Xopenex . However his insurance probably will not cover it. He says he is able to tolerate Albuterol  without any tachycardia, heart palpitations or jitters. Rexene Catching is this okay for him to use Albuterol ?

## 2024-03-28 ENCOUNTER — Other Ambulatory Visit: Payer: Self-pay

## 2024-03-28 MED ORDER — ALBUTEROL SULFATE HFA 108 (90 BASE) MCG/ACT IN AERS: 2.0000 | INHALATION_SPRAY | Freq: Four times a day (QID) | RESPIRATORY_TRACT | 1 refills | Status: AC | PRN

## 2024-03-28 NOTE — Telephone Encounter (Signed)
Albuterol sent to pharmacy. 

## 2024-04-11 ENCOUNTER — Ambulatory Visit: Payer: Medicare HMO | Admitting: Family Medicine

## 2024-04-19 ENCOUNTER — Telehealth: Payer: Self-pay | Admitting: Family Medicine

## 2024-04-19 NOTE — Telephone Encounter (Signed)
 OK to switch to Advair 230-2 puffs twice a day with a spacer. Thank you

## 2024-04-19 NOTE — Telephone Encounter (Signed)
 Paul Roach called and stated that he was given a list of prescriptions at his last visit, and did some research with [employer]. They gave him the name a of script and he wants to know if you think it would be okay for him to use it as it is more affordable. It is Adbair HFA 230 mcg. He is asking that either you or a nurse get back in touch with him to discuss. [phone].

## 2024-04-19 NOTE — Telephone Encounter (Signed)
 Called patient back and he would like his Arnuity switched to Advair HFA 230 mcg due to cost. He wanted to know if this would be a good idea to switch. Please advise. Thanks.

## 2024-04-20 MED ORDER — FLUTICASONE-SALMETEROL 230-21 MCG/ACT IN AERO: 2.0000 | INHALATION_SPRAY | Freq: Two times a day (BID) | RESPIRATORY_TRACT | 1 refills | Status: DC

## 2024-04-20 NOTE — Telephone Encounter (Signed)
 Spoke with pt and he wanted Rx to go to [facility]. This has been sent.

## 2024-05-23 ENCOUNTER — Ambulatory Visit: Admitting: Internal Medicine

## 2024-05-23 ENCOUNTER — Other Ambulatory Visit: Payer: Self-pay

## 2024-05-23 VITALS — BP 124/80 | HR 70 | Temp 98.0°F | Resp 18 | Ht 73.0 in | Wt 227.7 lb

## 2024-05-23 DIAGNOSIS — J4530 Mild persistent asthma, uncomplicated: Secondary | ICD-10-CM

## 2024-05-23 DIAGNOSIS — J329 Chronic sinusitis, unspecified: Secondary | ICD-10-CM | POA: Diagnosis not present

## 2024-05-23 DIAGNOSIS — J339 Nasal polyp, unspecified: Secondary | ICD-10-CM | POA: Diagnosis not present

## 2024-05-23 MED ORDER — MONTELUKAST SODIUM 10 MG PO TABS: 10.0000 mg | ORAL_TABLET | Freq: Every day | ORAL | 1 refills | Status: AC

## 2024-05-23 MED ORDER — BUDESONIDE 0.5 MG/2ML IN SUSP: RESPIRATORY_TRACT | 3 refills | Status: AC

## 2024-05-23 NOTE — Patient Instructions (Addendum)
 Asthma Continue Advair 230mcg 2 puffs twice daily  Continue Xopenex  2 puffs once every 4-6 hours as needed for cough or wheeze. This will replace albuterol  for relief of asthma symptoms You may use Xopenex  2 puffs 5 to 15 minutes before activity to decrease cough or wheeze  Chronic rhinitis with nasal polyposis Continue budesonide  rinses twice daily  Start Montelukast  10mg  at night  Continue azelastine  2 sprays in each nostril up to twice a day as needed for runny nose or itch in the nose Consider saline nasal rinses as needed for nasal symptoms. Use this before any medicated nasal sprays for best result  Follow up: in 6 months

## 2024-05-23 NOTE — Progress Notes (Signed)
 FOLLOW UP Date of Service/Encounter:  05/23/24  Subjective:  Paul Karwowski Sr. (DOB: 1946/08/16) is a 78 y.o. male who [facility] in re-evaluation of the following: asthma, rhinitis, nasal polyposis  History obtained from: chart review and patient.  For Review, LV was on 03/21/24  with Arlean Mutter, FNP seen for routine follow-up. See below for summary of history and diagnostics.  FEV1 1.81, + 23%, 420cc.  Transitioned to advair 230mcg due to insurance coverage   Today presents for follow-up. Discussed the use of AI scribe software for clinical note transcription with the patient, who gave verbal consent to proceed.  History of Present Illness Paul Baratta Sr. is a 78 year old male with asthma and nasal polyps who presents with congestion and nasal drainage.  Nasal congestion and drainage - Increased nasal congestion and drainage - Frequently blowing his nose - Currently using sinus rinse with budesonide  for symptom control - Previously used Flonase , now replaced with sinus rinse - Seeking refill for sinus rinse medication - Previously on montelukast  was unsure if he should continue this given he is on Advair  Asthma symptoms and management - Currently on Advair, two puffs twice daily, with no missed doses - No cough, wheeze, or shortness of breath - Rare use of Xopenex   Nasal polyposis - History of nasal polyps, previously managed with medication - Currently relies on medication for control     All medications reviewed by clinical staff and updated in chart. No new pertinent medical or surgical history except as noted in HPI.  ROS: All others negative except as noted per HPI.   Objective:  BP 124/80 (BP Location: Right Arm, Patient Position: Sitting, Cuff Size: Normal)   Pulse 70   Temp 98 F (36.7 C) (Temporal)   Resp 18   Ht 6' 1 (1.854 m)   Wt 227 lb 11.2 oz (103.3 kg)   SpO2 96%   BMI 30.04 kg/m  Body mass index is  30.04 kg/m. Physical Exam: General Appearance:  Alert, cooperative, no distress, appears stated age  Head:  Normocephalic, without obvious abnormality, atraumatic  Eyes:  Conjunctiva clear, EOM's intact  Ears EACs normal bilaterally  Nose: Nares normal, pink mucosa , no visible anterior polyps, and septum midline  Throat: Lips, tongue normal; teeth and gums normal, + cobblestoning  Neck: Supple, symmetrical  Lungs:   clear to auscultation bilaterally, Respirations unlabored, no coughing  Heart:  regular rate and rhythm and no murmur, Appears well perfused  Extremities: No edema  Skin: Skin color, texture, turgor normal and no rashes or lesions on visualized portions of skin  Neurologic: No gross deficits   Labs:  Lab Orders  No laboratory test(s) ordered today     Assessment/Plan   Patient Instructions  Asthma Continue Advair 230mcg 2 puffs twice daily  Continue Xopenex  2 puffs once every 4-6 hours as needed for cough or wheeze. This will replace albuterol  for relief of asthma symptoms You may use Xopenex  2 puffs 5 to 15 minutes before activity to decrease cough or wheeze  Chronic rhinitis with nasal polyposis Continue budesonide  rinses twice daily  Start Montelukast  10mg  at night  Continue azelastine  2 sprays in each nostril up to twice a day as needed for runny nose or itch in the nose Consider saline nasal rinses as needed for nasal symptoms. Use this before any medicated nasal sprays for best result  Follow up: in 6 months   Other:  Thank you so much for letting me partake in your care today.  Don't hesitate to reach out if you have any additional concerns!  Hargis Springer, MD  [facility] [place] [place]

## 2024-05-25 ENCOUNTER — Telehealth: Payer: Self-pay | Admitting: Internal Medicine

## 2024-05-25 NOTE — Telephone Encounter (Signed)
 Pt called and stated he was in the office a couple a days ago and has a sinus infection and wanted to know if he can get some meds and requested a call back.

## 2024-05-26 NOTE — Telephone Encounter (Signed)
 He was seen 3 days ago and exam was relatively normal.  Are these new symptoms and what are they exactly?

## 2024-05-26 NOTE — Telephone Encounter (Signed)
 Tried calling pt, no answer. Left voicemail for a return call. Need to know when his symptoms started and what symptoms he is currently having?

## 2024-05-31 DIAGNOSIS — I4821 Permanent atrial fibrillation: Secondary | ICD-10-CM | POA: Diagnosis not present

## 2024-05-31 DIAGNOSIS — E785 Hyperlipidemia, unspecified: Secondary | ICD-10-CM | POA: Diagnosis not present

## 2024-05-31 DIAGNOSIS — I10 Essential (primary) hypertension: Secondary | ICD-10-CM | POA: Diagnosis not present

## 2024-05-31 DIAGNOSIS — E663 Overweight: Secondary | ICD-10-CM | POA: Diagnosis not present

## 2024-05-31 DIAGNOSIS — E039 Hypothyroidism, unspecified: Secondary | ICD-10-CM | POA: Diagnosis not present

## 2024-05-31 DIAGNOSIS — Z0000 Encounter for general adult medical examination without abnormal findings: Secondary | ICD-10-CM | POA: Diagnosis not present

## 2024-06-06 DIAGNOSIS — E785 Hyperlipidemia, unspecified: Secondary | ICD-10-CM | POA: Diagnosis not present

## 2024-06-06 DIAGNOSIS — I10 Essential (primary) hypertension: Secondary | ICD-10-CM | POA: Diagnosis not present

## 2024-06-06 DIAGNOSIS — I4821 Permanent atrial fibrillation: Secondary | ICD-10-CM | POA: Diagnosis not present

## 2024-06-06 DIAGNOSIS — Z0000 Encounter for general adult medical examination without abnormal findings: Secondary | ICD-10-CM | POA: Diagnosis not present

## 2024-06-06 DIAGNOSIS — J019 Acute sinusitis, unspecified: Secondary | ICD-10-CM | POA: Diagnosis not present

## 2024-06-06 DIAGNOSIS — E663 Overweight: Secondary | ICD-10-CM | POA: Diagnosis not present

## 2024-06-06 DIAGNOSIS — Z23 Encounter for immunization: Secondary | ICD-10-CM | POA: Diagnosis not present

## 2024-06-06 DIAGNOSIS — N1832 Chronic kidney disease, stage 3b: Secondary | ICD-10-CM | POA: Diagnosis not present

## 2024-06-06 DIAGNOSIS — J45909 Unspecified asthma, uncomplicated: Secondary | ICD-10-CM | POA: Diagnosis not present

## 2024-06-06 DIAGNOSIS — E039 Hypothyroidism, unspecified: Secondary | ICD-10-CM | POA: Diagnosis not present

## 2024-06-06 NOTE — Progress Notes (Signed)
 Prior to vaccine administration appropriate Vaccine Information Sheets provided to patient/guardian.  All questions/concerns addressed.  Vaccines administered per Alyannah Sanks order after a double check was performed by Arnaldo Collet, CCMA.  Patient/Guardian instructed to wait in the office for 15 minutes for observation of any adverse reactions.

## 2024-06-06 NOTE — Assessment & Plan Note (Signed)
 Risks and benefits of the Influenza vaccine have been discussed with the patient/care giver.  Patient/care giver concerns were answered to their satisfaction.  Signs and symptoms of adverse effects and when to seek medical attention if adverse effects occur were discussed with the patient/care giver.

## 2024-06-06 NOTE — Assessment & Plan Note (Signed)
 Kidney function remains fairly unchanged.  Avoid NSAIDs and increase daily water intake.

## 2024-06-06 NOTE — Assessment & Plan Note (Signed)
 Discussed risk of bacterial superinfection and impact on health due to immunocompromise state from medication.  Prescription provided for Flonase  and Augmentin .  Contact clinic if symptoms do not improve or worsen.

## 2024-06-06 NOTE — Assessment & Plan Note (Signed)
 Rate controlled and asymptomatic.  Continue to take all medicines as prescribed.

## 2024-06-06 NOTE — Assessment & Plan Note (Signed)
 Controlled.  Use inhalers as recommended.

## 2024-06-06 NOTE — Assessment & Plan Note (Signed)
 Patient's history and physical findings reviewed.    Medications and allergies reviewed and updated.    Age specific preventative screening and guideline reviewed.    Vaccination recommendations discussed.    PSA normal.    Depression screen negative.    Screen for alcohol use negative.     Screen for falls or near falls negative.

## 2024-06-27 DIAGNOSIS — Z6830 Body mass index (BMI) 30.0-30.9, adult: Secondary | ICD-10-CM | POA: Diagnosis not present

## 2024-06-27 DIAGNOSIS — M0579 Rheumatoid arthritis with rheumatoid factor of multiple sites without organ or systems involvement: Secondary | ICD-10-CM | POA: Diagnosis not present

## 2024-06-27 DIAGNOSIS — E669 Obesity, unspecified: Secondary | ICD-10-CM | POA: Diagnosis not present

## 2024-06-27 DIAGNOSIS — R5382 Chronic fatigue, unspecified: Secondary | ICD-10-CM | POA: Diagnosis not present

## 2024-08-10 ENCOUNTER — Other Ambulatory Visit: Payer: Self-pay | Admitting: Gastroenterology

## 2024-08-16 DIAGNOSIS — N183 Chronic kidney disease, stage 3 unspecified: Secondary | ICD-10-CM | POA: Diagnosis not present

## 2024-08-22 DIAGNOSIS — N390 Urinary tract infection, site not specified: Secondary | ICD-10-CM | POA: Diagnosis not present

## 2024-08-22 DIAGNOSIS — N183 Chronic kidney disease, stage 3 unspecified: Secondary | ICD-10-CM | POA: Diagnosis not present

## 2024-08-22 DIAGNOSIS — E669 Obesity, unspecified: Secondary | ICD-10-CM | POA: Diagnosis not present

## 2024-08-22 DIAGNOSIS — I482 Chronic atrial fibrillation, unspecified: Secondary | ICD-10-CM | POA: Diagnosis not present

## 2024-08-22 DIAGNOSIS — E118 Type 2 diabetes mellitus with unspecified complications: Secondary | ICD-10-CM | POA: Diagnosis not present

## 2024-08-22 DIAGNOSIS — I10 Essential (primary) hypertension: Secondary | ICD-10-CM | POA: Diagnosis not present

## 2024-08-28 ENCOUNTER — Encounter (HOSPITAL_COMMUNITY): Payer: Self-pay | Admitting: Gastroenterology

## 2024-09-04 NOTE — Anesthesia Preprocedure Evaluation (Signed)
 Anesthesia Evaluation  Patient identified by MRN, date of birth, ID band Patient awake    Reviewed: Allergy  & Precautions, H&P , NPO status , Patient's Chart, lab work & pertinent test results, reviewed documented beta blocker date and time   Airway Mallampati: I  TM Distance: >3 FB Neck ROM: Full    Dental no notable dental hx. (+) Teeth Intact   Pulmonary neg pulmonary ROS, asthma    Pulmonary exam normal breath sounds clear to auscultation       Cardiovascular hypertension, Pt. on medications and Pt. on home beta blockers negative cardio ROS Normal cardiovascular exam Rhythm:Regular Rate:Normal     Neuro/Psych negative neurological ROS  negative psych ROS   GI/Hepatic negative GI ROS, Neg liver ROS,,,  Endo/Other  negative endocrine ROSdiabetes, Type 2    Renal/GU Renal diseasenegative Renal ROSCKD III  negative genitourinary   Musculoskeletal negative musculoskeletal ROS (+)    Abdominal Normal abdominal exam  (+)   Peds negative pediatric ROS (+)  Hematology negative hematology ROS (+) Blood dyscrasia, anemia   Anesthesia Other Findings   Reproductive/Obstetrics negative OB ROS                              Anesthesia Physical Anesthesia Plan  ASA: 3  Anesthesia Plan: MAC   Post-op Pain Management: Minimal or no pain anticipated   Induction: Intravenous  PONV Risk Score and Plan: 1 and Propofol  infusion  Airway Management Planned: Natural Airway, Mask and Simple Face Mask  Additional Equipment: None  Intra-op Plan:   Post-operative Plan:   Informed Consent: I have reviewed the patients History and Physical, chart, labs and discussed the procedure including the risks, benefits and alternatives for the proposed anesthesia with the patient or authorized representative who has indicated his/her understanding and acceptance.     Dental advisory given  Plan Discussed  with: CRNA and Anesthesiologist  Anesthesia Plan Comments:          Anesthesia Quick Evaluation

## 2024-09-05 ENCOUNTER — Ambulatory Visit (HOSPITAL_COMMUNITY): Payer: Self-pay | Admitting: Anesthesiology

## 2024-09-05 ENCOUNTER — Other Ambulatory Visit: Payer: Self-pay

## 2024-09-05 ENCOUNTER — Encounter (HOSPITAL_COMMUNITY)
Admission: RE | Disposition: A | Discharge status: Home / Self Care | Payer: Self-pay | Source: Home / Self Care | Attending: Gastroenterology

## 2024-09-05 ENCOUNTER — Ambulatory Visit (HOSPITAL_COMMUNITY)
Admission: RE | Admit: 2024-09-05 | Discharge: 2024-09-05 | Disposition: A | Discharge status: Home / Self Care | Attending: Gastroenterology | Admitting: Gastroenterology

## 2024-09-05 ENCOUNTER — Encounter (HOSPITAL_COMMUNITY): Payer: Self-pay | Admitting: Gastroenterology

## 2024-09-05 ENCOUNTER — Encounter (HOSPITAL_COMMUNITY): Payer: Self-pay | Admitting: Anesthesiology

## 2024-09-05 DIAGNOSIS — Z860101 Personal history of adenomatous and serrated colon polyps: Secondary | ICD-10-CM

## 2024-09-05 DIAGNOSIS — Z1211 Encounter for screening for malignant neoplasm of colon: Secondary | ICD-10-CM | POA: Diagnosis not present

## 2024-09-05 DIAGNOSIS — I129 Hypertensive chronic kidney disease with stage 1 through stage 4 chronic kidney disease, or unspecified chronic kidney disease: Secondary | ICD-10-CM

## 2024-09-05 DIAGNOSIS — N183 Chronic kidney disease, stage 3 unspecified: Secondary | ICD-10-CM

## 2024-09-05 DIAGNOSIS — D123 Benign neoplasm of transverse colon: Secondary | ICD-10-CM

## 2024-09-05 HISTORY — PX: POLYPECTOMY: SHX149

## 2024-09-05 HISTORY — PX: COLONOSCOPY: SHX5424

## 2024-09-05 SURGERY — COLONOSCOPY
Anesthesia: Monitor Anesthesia Care

## 2024-09-05 MED ORDER — PROPOFOL 500 MG/50ML IV EMUL
INTRAVENOUS | Status: DC | PRN
  Administered 2024-09-05: 80 mg via INTRAVENOUS
  Administered 2024-09-05: 85 ug/kg/min via INTRAVENOUS
  Administered 2024-09-05: 20 mg via INTRAVENOUS

## 2024-09-05 MED ORDER — PHENYLEPHRINE HCL-NACL 20-0.9 MG/250ML-% IV SOLN
INTRAVENOUS | Status: DC | PRN
  Administered 2024-09-05: 50 ug/min via INTRAVENOUS

## 2024-09-05 MED ORDER — GLYCOPYRROLATE PF 0.2 MG/ML IJ SOSY
PREFILLED_SYRINGE | INTRAMUSCULAR | Status: DC | PRN
  Administered 2024-09-05: .2 mg via INTRAVENOUS

## 2024-09-05 MED ORDER — SODIUM CHLORIDE 0.9 % IV SOLN: INTRAVENOUS | Status: DC

## 2024-09-05 MED ORDER — PHENYLEPHRINE 80 MCG/ML (10ML) SYRINGE FOR IV PUSH (FOR BLOOD PRESSURE SUPPORT)
PREFILLED_SYRINGE | INTRAVENOUS | Status: DC | PRN
  Administered 2024-09-05: 80 ug via INTRAVENOUS
  Administered 2024-09-05 (×3): 160 ug via INTRAVENOUS
  Administered 2024-09-05: 80 ug via INTRAVENOUS
  Administered 2024-09-05: 160 ug via INTRAVENOUS

## 2024-09-05 MED ORDER — PROPOFOL 500 MG/50ML IV EMUL
INTRAVENOUS | Status: AC
  Filled 2024-09-05: qty 50

## 2024-09-05 MED ORDER — PROPOFOL 10 MG/ML IV BOLUS
INTRAVENOUS | Status: AC
  Filled 2024-09-05: qty 20

## 2024-09-05 MED ORDER — DEXMEDETOMIDINE HCL IN NACL 80 MCG/20ML IV SOLN
INTRAVENOUS | Status: AC
  Filled 2024-09-05: qty 20

## 2024-09-05 NOTE — Op Note (Signed)
 [facility] Patient Name: Paul Roach Procedure Date: 09/05/2024 MRN: [id number] Attending MD: Jerrell JAYSON Sol , MD, [id number] Date of Birth: Mar 15, 1946 CSN: [id number] Age: 78 Admit Type: Outpatient Procedure:                Colonoscopy Indications:              High risk colon cancer surveillance: Personal                            history of adenoma (10 mm or greater in size), Last                            colonoscopy: May 2025 Providers:                Jerrell KYM Sol, MD, Ozell Pouch, Farris Southgate, Technician Referring MD:             Carlin Gull Medicines:                Propofol  per Anesthesia, Monitored Anesthesia Care Complications:            No immediate complications. Estimated Blood Loss:     Estimated blood loss was minimal. Procedure:                Pre-Anesthesia Assessment:                           - Prior to the procedure, a History and Physical                            was performed, and patient medications and                            allergies were reviewed. The patient's tolerance of                            previous anesthesia was also reviewed. The risks                            and benefits of the procedure and the sedation                            options and risks were discussed with the patient.                            All questions were answered, and informed consent                            was obtained. Prior Anticoagulants: The patient has                            taken no anticoagulant or antiplatelet agents. ASA  Grade Assessment: III - A patient with severe                            systemic disease. After reviewing the risks and                            benefits, the patient was deemed in satisfactory                            condition to undergo the procedure.                           After obtaining informed consent, the colonoscope                             was passed under direct vision. Throughout the                            procedure, the patient's blood pressure, pulse, and                            oxygen saturations were monitored continuously. The                            [id number]-HQ190DL ([id number] Olympus colonoscope was                            introduced through the anus and advanced to the the                            cecum, identified by appendiceal orifice and                            ileocecal valve. The colonoscopy was performed with                            difficulty due to significant looping and fair                            prep. Successful completion of the procedure was                            aided by straightening and shortening the scope to                            obtain bowel loop reduction, using scope torsion,                            applying abdominal pressure and lavage. The patient                            tolerated the procedure fairly well. The quality of  the bowel preparation was fair and fair but                            repeated irrigation led to a good and adequate prep. Scope In: 7:50:44 AM Scope Out: 8:29:05 AM Scope Withdrawal Time: 0 hours 25 minutes 5 seconds  Total Procedure Duration: 0 hours 38 minutes 21 seconds  Findings:      The perianal and digital rectal examinations were normal.      A 16 mm polyp was found in the hepatic flexure. The polyp was sessile.       The polyp was removed with a piecemeal technique using a hot snare.       Resection and retrieval were complete. Estimated blood loss was minimal.      A 10 mm polyp was found in the hepatic flexure. The polyp was sessile.       The polyp was removed with a piecemeal technique using a hot snare.       Resection and retrieval were complete. Estimated blood loss was minimal.      A 5 mm polyp was found in the hepatic flexure. The polyp was sessile.       The polyp  was removed with a hot snare. Resection and retrieval were       complete. Estimated blood loss: none.      A 6 mm polyp was found in the descending colon. The polyp was       semi-sessile. The polyp was removed with a hot snare. Resection and       retrieval were complete. Estimated blood loss: none.      There was a medium-sized lipoma, in the ascending colon.      Internal hemorrhoids were found during retroflexion. The hemorrhoids       were medium-sized and Grade I (internal hemorrhoids that do not       prolapse).      A single medium-mouthed diverticulum was found in the ascending colon. Impression:               - Preparation of the colon was fair.                           - One 16 mm polyp at the hepatic flexure, removed                            piecemeal using a hot snare. Resected and retrieved.                           - One 10 mm polyp at the hepatic flexure, removed                            piecemeal using a hot snare. Resected and retrieved.                           - One 5 mm polyp at the hepatic flexure, removed                            with a hot snare. Resected and retrieved.                           -  One 6 mm polyp in the descending colon, removed                            with a hot snare. Resected and retrieved.                           - Medium-sized lipoma in the ascending colon.                           - Internal hemorrhoids.                           - Diverticulosis in the ascending colon. Moderate Sedation:      N/A - MAC procedure Recommendation:           - Patient has a contact number available for                            emergencies. The signs and symptoms of potential                            delayed complications were discussed with the                            patient. Return to normal activities tomorrow.                            Written discharge instructions were provided to the                            patient.                            - Advance diet as tolerated and clear liquid diet.                           - Await pathology results.                           - Repeat colonoscopy for surveillance based on                            pathology results.                           - No ibuprofen, naproxen, or other non-steroidal                            anti-inflammatory drugs for 1 week. Procedure Code(s):        --- Professional ---                           [phone], Colonoscopy, flexible; with removal of                            tumor(s), polyp(s), or other lesion(s) by snare  technique Diagnosis Code(s):        --- Professional ---                           Z86.010, Personal history of colonic polyps                           K64.0, First degree hemorrhoids                           D12.3, Benign neoplasm of transverse colon (hepatic                            flexure or splenic flexure)                           D12.4, Benign neoplasm of descending colon                           K57.30, Diverticulosis of large intestine without                            perforation or abscess without bleeding                           D17.5, Benign lipomatous neoplasm of                            intra-abdominal organs CPT copyright 2022 American Medical Association. All rights reserved. The codes documented in this report are preliminary and upon coder review may  be revised to meet current compliance requirements. Jerrell JAYSON Sol, MD 09/05/2024 8:41:02 AM This report has been signed electronically. Number of Addenda: 0

## 2024-09-05 NOTE — Anesthesia Procedure Notes (Signed)
 Procedure Name: MAC Date/Time: 09/05/2024 7:41 AM  Performed by: Nada Corean CROME, CRNAPre-anesthesia Checklist: Patient identified, Emergency Drugs available, Suction available, Patient being monitored and Timeout performed Patient Re-evaluated:Patient Re-evaluated prior to induction Oxygen Delivery Method: Simple face mask Preoxygenation: Pre-oxygenation with 100% oxygen Induction Type: IV induction Placement Confirmation: positive ETCO2 Dental Injury: Teeth and Oropharynx as per pre-operative assessment

## 2024-09-05 NOTE — Anesthesia Postprocedure Evaluation (Signed)
 Anesthesia Post Note  Patient: Paul Saba Sr.  Procedure(s) Performed: COLONOSCOPY POLYPECTOMY, INTESTINE     Patient location during evaluation: PACU Anesthesia Type: MAC Level of consciousness: awake and alert Pain management: pain level controlled Vital Signs Assessment: post-procedure vital signs reviewed and stable Respiratory status: spontaneous breathing, nonlabored ventilation, respiratory function stable and patient connected to nasal cannula oxygen Cardiovascular status: stable and blood pressure returned to baseline Postop Assessment: no apparent nausea or vomiting Anesthetic complications: no   No notable events documented.  Last Vitals:  Vitals:   09/05/24 0900 09/05/24 0905  BP: 108/61 (!) 113/48  Pulse: 70 69  Resp: 20 18  Temp:    SpO2: 96% 98%    Last Pain:  Vitals:   09/05/24 0905  TempSrc:   PainSc: 0-No pain                 Topacio Cella

## 2024-09-05 NOTE — H&P (Signed)
 Date of Initial H&P: 08/28/24  History reviewed, patient examined, no change in status, stable for surgery.

## 2024-09-05 NOTE — Discharge Instructions (Signed)

## 2024-09-05 NOTE — Transfer of Care (Signed)
 Immediate Anesthesia Transfer of Care Note  Patient: Paul Saba Sr.  Procedure(s) Performed: COLONOSCOPY POLYPECTOMY, INTESTINE  Patient Location: PACU  Anesthesia Type:MAC  Level of Consciousness: drowsy and responds to stimulation  Airway & Oxygen Therapy: Patient Spontanous Breathing  Post-op Assessment: Report given to RN and Post -op Vital signs reviewed and stable  Post vital signs: Reviewed and stable  Last Vitals:  Vitals Value Taken Time  BP 86/50 09/05/24 08:35  Temp    Pulse 69 09/05/24 08:37  Resp 19 09/05/24 08:37  SpO2 96 % 09/05/24 08:37  Vitals shown include unfiled device data.  Last Pain:  Vitals:   09/05/24 0657  TempSrc: Temporal  PainSc: 0-No pain         Complications: No notable events documented.

## 2024-09-05 NOTE — Interval H&P Note (Signed)
 History and Physical Interval Note:  09/05/2024 7:39 AM  Paul Saba Sr.  has presented today for surgery, with the diagnosis of History of colon polyps.  The various methods of treatment have been discussed with the patient and family. After consideration of risks, benefits and other options for treatment, the patient has consented to  Procedure(s): COLONOSCOPY (N/A) as a surgical intervention.  The patient's history has been reviewed, patient examined, no change in status, stable for surgery.  I have reviewed the patient's chart and labs.  Questions were answered to the patient's satisfaction.     Paul Roach

## 2024-09-06 ENCOUNTER — Encounter (HOSPITAL_COMMUNITY): Payer: Self-pay | Admitting: Gastroenterology

## 2024-09-06 LAB — SURGICAL PATHOLOGY

## 2024-11-01 ENCOUNTER — Other Ambulatory Visit: Payer: Self-pay | Admitting: Family Medicine

## 2024-11-22 ENCOUNTER — Ambulatory Visit: Admitting: Internal Medicine

## 2024-12-06 ENCOUNTER — Ambulatory Visit: Admitting: Internal Medicine
